# Patient Record
Sex: Male | Born: 1980
Health system: Southern US, Community
[De-identification: ages and names within clinical notes are randomized; demographics above are authoritative.]

## PROBLEM LIST (undated history)

## (undated) DIAGNOSIS — I743 Embolism and thrombosis of arteries of the lower extremities: Secondary | ICD-10-CM

## (undated) DIAGNOSIS — E559 Vitamin D deficiency, unspecified: Secondary | ICD-10-CM

## (undated) DIAGNOSIS — F32A Depression, unspecified: Secondary | ICD-10-CM

## (undated) DIAGNOSIS — N469 Male infertility, unspecified: Secondary | ICD-10-CM

## (undated) DIAGNOSIS — K5900 Constipation, unspecified: Secondary | ICD-10-CM

## (undated) DIAGNOSIS — R0602 Shortness of breath: Secondary | ICD-10-CM

## (undated) DIAGNOSIS — R002 Palpitations: Secondary | ICD-10-CM

## (undated) DIAGNOSIS — F419 Anxiety disorder, unspecified: Secondary | ICD-10-CM

## (undated) DIAGNOSIS — F329 Major depressive disorder, single episode, unspecified: Secondary | ICD-10-CM

## (undated) HISTORY — DX: Male infertility, unspecified: N46.9

## (undated) HISTORY — DX: Constipation, unspecified: K59.00

## (undated) HISTORY — DX: Vitamin D deficiency, unspecified: E55.9

## (undated) HISTORY — DX: Shortness of breath: R06.02

## (undated) HISTORY — DX: Embolism and thrombosis of arteries of the lower extremities: I74.3

## (undated) HISTORY — PX: WISDOM TOOTH EXTRACTION: SHX21

## (undated) HISTORY — DX: Anxiety disorder, unspecified: F41.9

## (undated) HISTORY — DX: Palpitations: R00.2

---

## 2007-10-19 ENCOUNTER — Emergency Department (HOSPITAL_COMMUNITY): Admission: EM | Admit: 2007-10-19 | Discharge: 2007-10-19 | Payer: Self-pay | Admitting: Emergency Medicine

## 2011-07-31 LAB — URINE MICROSCOPIC-ADD ON

## 2011-07-31 LAB — URINE CULTURE: Colony Count: >=100000

## 2011-07-31 LAB — URINALYSIS, ROUTINE W REFLEX MICROSCOPIC
Bilirubin Urine: NEGATIVE
Protein, ur: >300 — AB
Urobilinogen, UA: 1

## 2011-08-29 ENCOUNTER — Emergency Department (HOSPITAL_COMMUNITY)
Admission: EM | Admit: 2011-08-29 | Discharge: 2011-08-29 | Disposition: A | Discharge status: Home / Self Care | Payer: 59 | Attending: Emergency Medicine | Admitting: Emergency Medicine

## 2011-08-29 DIAGNOSIS — Z79899 Other long term (current) drug therapy: Secondary | ICD-10-CM | POA: Insufficient documentation

## 2011-08-29 DIAGNOSIS — F3289 Other specified depressive episodes: Secondary | ICD-10-CM | POA: Insufficient documentation

## 2011-08-29 DIAGNOSIS — R197 Diarrhea, unspecified: Secondary | ICD-10-CM | POA: Insufficient documentation

## 2011-08-29 DIAGNOSIS — F329 Major depressive disorder, single episode, unspecified: Secondary | ICD-10-CM | POA: Insufficient documentation

## 2011-08-29 DIAGNOSIS — R112 Nausea with vomiting, unspecified: Secondary | ICD-10-CM | POA: Insufficient documentation

## 2011-08-29 LAB — COMPREHENSIVE METABOLIC PANEL
BUN: 20 mg/dL (ref 6–23)
Calcium: 9.9 mg/dL (ref 8.4–10.5)
GFR calc Af Amer: >90 mL/min (ref >90)
Glucose, Bld: 121 mg/dL — ABNORMAL HIGH (ref 70–99)
Sodium: 137 mEq/L (ref 135–145)
Total Protein: 7.6 g/dL (ref 6.0–8.3)

## 2011-08-29 LAB — CBC
Hemoglobin: 16.7 g/dL (ref 13.0–17.0)
MCH: 30.4 pg (ref 26.0–34.0)
MCHC: 35.2 g/dL (ref 30.0–36.0)
RDW: 12.7 % (ref 11.5–15.5)

## 2011-08-29 LAB — DIFFERENTIAL
Basophils Absolute: 0 10*3/uL (ref 0.0–0.1)
Lymphs Abs: 0.4 10*3/uL — ABNORMAL LOW (ref 0.7–4.0)
Monocytes Absolute: 1.1 10*3/uL — ABNORMAL HIGH (ref 0.1–1.0)
Monocytes Relative: 10 % (ref 3–12)
Neutro Abs: 9.4 10*3/uL — ABNORMAL HIGH (ref 1.7–7.7)

## 2011-08-29 LAB — LIPASE, BLOOD: Lipase: 19 U/L (ref 11–59)

## 2011-12-27 ENCOUNTER — Encounter (HOSPITAL_COMMUNITY): Payer: Self-pay | Admitting: *Deleted

## 2011-12-27 ENCOUNTER — Emergency Department (HOSPITAL_COMMUNITY)
Admission: EM | Admit: 2011-12-27 | Discharge: 2011-12-27 | Disposition: A | Discharge status: Home / Self Care | Payer: 59 | Source: Home / Self Care | Attending: Family Medicine | Admitting: Family Medicine

## 2011-12-27 DIAGNOSIS — J069 Acute upper respiratory infection, unspecified: Secondary | ICD-10-CM

## 2011-12-27 HISTORY — DX: Depression, unspecified: F32.A

## 2011-12-27 HISTORY — DX: Major depressive disorder, single episode, unspecified: F32.9

## 2011-12-27 NOTE — Discharge Instructions (Signed)
Drink plenty of fluids as discussed, use lozenges and mucinex or delsym for cough. Return or see your doctor if further problems °

## 2011-12-27 NOTE — ED Provider Notes (Signed)
History     CSN: [id number]  Arrival date & time 12/27/11  1031   First MD Initiated Contact with Patient 12/27/11 1047      Chief Complaint  Patient presents with  . Sore Throat    (Consider location/radiation/quality/duration/timing/severity/associated sxs/prior treatment) Patient is a 31 y.o. male presenting with pharyngitis. The history is provided by the patient.  Sore Throat This is a new problem. The current episode started more than 1 week ago. The problem occurs constantly. The problem has not changed since onset.Associated symptoms comments: Seen twice by lmd prior to onset for flu sx, given z-pak. The symptoms are aggravated by swallowing.    Past Medical History  Diagnosis Date  . Asthma     exercise-induced  . Depression     History reviewed. No pertinent past surgical history.  No family history on file.  History  Substance Use Topics  . Smoking status: Never Smoker   . Smokeless tobacco: Not on file  . Alcohol Use: Yes     occasional      Review of Systems  Constitutional: Negative.   HENT: Positive for congestion, sore throat and rhinorrhea.   Respiratory: Negative.   Gastrointestinal: Negative.   Skin: Negative.     Allergies  Review of patient's allergies indicates no known allergies.  Home Medications   Current Outpatient Rx  Name Route Sig Dispense Refill  . ALBUTEROL IN Inhalation Inhale into the lungs as needed.    . BUPROPION HCL PO Oral Take by mouth.      BP 129/68  Pulse 86  Temp(Src) 97.9 F (36.6 C) (Oral)  Resp 18  SpO2 98%  Physical Exam  Nursing note and vitals reviewed. Constitutional: He is oriented to person, place, and time. He appears well-developed and well-nourished.  HENT:  Head: Normocephalic.  Right Ear: External ear normal.  Left Ear: External ear normal.  Nose: Nose normal.  Mouth/Throat: Oropharynx is clear and moist.  Eyes: Conjunctivae are normal. Pupils are equal, round, and reactive to light.    Neck: Normal range of motion. Neck supple.  Cardiovascular: Normal rate, regular rhythm, normal heart sounds and intact distal pulses.   Pulmonary/Chest: Effort normal and breath sounds normal.  Lymphadenopathy:    He has no cervical adenopathy.  Neurological: He is alert and oriented to person, place, and time.  Skin: Skin is warm and dry.    ED Course  Procedures (including critical care time)  Labs Reviewed - No data to display No results found.   1. URI (upper respiratory infection)       MDM          Barkley Bruns, MD 12/27/11 1133

## 2011-12-27 NOTE — ED Notes (Signed)
Reports had influenza, followed by bronchitis and sinusitis (had Z-pak); as he was finishing Z-pak approx 10 days ago woke with sore throat.  Sore throat has continued w/ fluctuating severity.  Denies fevers; has residual cough following influenza, but nothing new.  Has been drinking plenty of fluids and taking vitamins.

## 2015-10-27 HISTORY — PX: COLONOSCOPY: SHX174

## 2015-11-04 DIAGNOSIS — Z3189 Encounter for other procreative management: Secondary | ICD-10-CM | POA: Diagnosis not present

## 2015-12-10 DIAGNOSIS — R5383 Other fatigue: Secondary | ICD-10-CM | POA: Diagnosis not present

## 2015-12-10 DIAGNOSIS — G4733 Obstructive sleep apnea (adult) (pediatric): Secondary | ICD-10-CM | POA: Diagnosis not present

## 2015-12-10 DIAGNOSIS — N4601 Organic azoospermia: Secondary | ICD-10-CM | POA: Diagnosis not present

## 2015-12-10 DIAGNOSIS — F321 Major depressive disorder, single episode, moderate: Secondary | ICD-10-CM | POA: Diagnosis not present

## 2015-12-18 ENCOUNTER — Institutional Professional Consult (permissible substitution): Payer: 59 | Admitting: Neurology

## 2016-01-14 ENCOUNTER — Encounter: Payer: Self-pay | Admitting: Neurology

## 2016-01-14 ENCOUNTER — Ambulatory Visit (INDEPENDENT_AMBULATORY_CARE_PROVIDER_SITE_OTHER): Payer: 59 | Admitting: Neurology

## 2016-01-14 VITALS — BP 150/82 | HR 64 | Resp 20 | Ht 70.0 in | Wt 320.0 lb

## 2016-01-14 DIAGNOSIS — G4710 Hypersomnia, unspecified: Secondary | ICD-10-CM | POA: Diagnosis not present

## 2016-01-14 DIAGNOSIS — G4730 Sleep apnea, unspecified: Secondary | ICD-10-CM | POA: Diagnosis not present

## 2016-01-14 DIAGNOSIS — R0683 Snoring: Secondary | ICD-10-CM | POA: Insufficient documentation

## 2016-01-14 NOTE — Patient Instructions (Signed)

## 2016-01-14 NOTE — Progress Notes (Signed)
S[facility]EP MED[facility]INE CLINIC   Provider:  Larey Seat, M D  Referring Provider: Merrilee Seashore, MD Primary Care Physician:  Merrilee Seashore, MD  Chief Complaint  Patient presents with  . New Patient (Initial Visit)    referral by Dr. Rogelia Mire, wife tells pt that he snores, rm 11, alone    HPI:  Alex [name] is a 35 y.o. male , seen here as a referral from Dr. Ashby Dawes for a sleep evaluation,   Chief complaint according to patient : " I have been snoring and was witnessed to have crescendo breathing, but HST was inconclusive "  Alex [name] is here today upon request of his primary care physician, he has been fatigued for quite a while. Alex [name] also has noticed that he sometimes when trying to take a nap feels actually worse not refreshed, not restored but craving more. Plus this may actually detract from his nocturnal sleep. He has always been somewhat sleep drunken from nap time even as a child. Dr. Ashby Dawes suspected that the patient may have obstructive sleep apnea based on his BMI, his Mallampati, and his symptoms including erectile dysfunction ( from SSRI )  , later azoospermia and still having residual depressive disorder. The patient work is as an Haematologist, is a Medical illustrator . Sleep habits are as follows:  The patient usually goes to bed around 9 PM but may not fall asleep promptly. He likes to read in bed. His bedroom otherwise is cool, quiet and dark. His alarm is set for 5:40 AM but he hits the snooze button a couple of times before getting up. He has recently noticed that his nights a more fragmented he turns over wakes up a couple of times but he does not look at a clock. He does not feel that his sleep has worsened. When he gets up in the morning he has a slightly dry mouth he does not wake up with headaches or from headaches. He sleeps on one pillow. He prefers to sleep on his side. He does not have back pain or other discomfort no shortness of breath no  coughing and no restless legs.  Sleep medical history and family sleep history: father has OSA but never got diagnosed.   Social history:  Married, no children. No tobacco use ever, he grew up  with smoking parents.  ETOH; 1-2 drinks a week, caffeine ; 3-4 cups a day, tea and coffee. No Soda.  Review of Systems: Out of a complete 14 system review, the patient complains of only the following symptoms, and all other reviewed systems are negative. Fatigue more than sleepiness  the patient endorsed snoring, fatigue, blood in stool, respiratory allergies depression and anxiety decreased energy and loss of interest in some activities. He has been treated for asthma, depression and anxiety in the past he is taking albuterol and is currently taking Viibryd 20 minute times daily his father has high blood pressure paternal grandmother and aunt had leukemia a maternal grandfather suffered from a skin cancer.  Epworth score  5, Fatigue severity score is elevated at 40  , depression score PHQ9 see below.    Social History   Social History  . Marital Status: Married    Spouse Name: N/A  . Number of Children: N/A  . Years of Education: N/A   Occupational History  . Not on file.   Social History Main Topics  . Smoking status: Never Smoker   . Smokeless tobacco: Not on file  . Alcohol Use:  1.2 oz/week    2 Standard drinks or equivalent per week     Comment: occasional  . Drug Use: Yes  . Sexual Activity: Not on file   Other Topics Concern  . Not on file   Social History Narrative   Drinks 3 cups of caffeine daily.    Family History  Problem Relation Age of Onset  . Hypertension Father     Past Medical History  Diagnosis Date  . Asthma     exercise-induced  . Depression   . Anxiety     History reviewed. No pertinent past surgical history.  Current Outpatient Prescriptions  Medication Sig Dispense Refill  . ALBUTEROL IN Inhale into the lungs as needed.    . ALPRAZolam (XANAX)  0.25 MG tablet Take 0.25 mg by mouth daily as needed for anxiety.    . cetirizine (ZYRTEC) 10 MG tablet Take 10 mg by mouth daily.    . Cholecalciferol (VITAMIN D3) 50000 units CAPS Take 5,000 Units by mouth.    . Vilazodone HCl (VIIBRYD) 40 MG TABS Take 40 mg by mouth daily.     No current facility-administered medications for this visit.    Allergies as of 01/14/2016  . (No Known Allergies)    Vitals: BP 150/82 mmHg  Pulse 64  Resp 20  Ht 5\' 10"  (1.778 m)  Wt 320 lb (145.151 kg)  BMI 45.92 kg/m2 Last Weight:  Wt Readings from Last 1 Encounters:  01/14/16 320 lb (145.151 kg)   [id number] mass index is 45.92 kg/(m^2).     Last Height:   Ht Readings from Last 1 Encounters:  01/14/16 5\' 10"  (1.778 m)    Physical exam:  General: The patient is awake, alert and appears not in acute distress. The patient is well groomed. Head: Normocephalic, atraumatic. Neck is supple. Mallampati 4,  neck circumference: 18. Nasal airflow non restricted, no nasal septal deviation ,Retrognathia is not seen. Full facial hair.   Cardiovascular:  Regular rate and rhythm , without  murmurs or carotid bruit, and without distended neck veins. Respiratory: Lungs are clear to auscultation. Skin:  Without evidence of edema, or rash Trunk: BMI is morbidly obese  Neurologic exam : The patient is awake and alert, oriented to place and time.   Memory subjective  described as intact.  Attention span & concentration ability appears normal.  Speech is fluent,  without dysarthria, dysphonia or aphasia.  Mood and affect are appropriate.  Cranial nerves: Pupils are equal and briskly reactive to light. Funduscopic exam without evidence of pallor or edema.  Extraocular movements  in vertical and horizontal planes intact and without nystagmus. Visual fields by finger perimetry are intact. Hearing to finger rub intact.   Facial sensation intact to fine touch.  Facial motor strength is symmetric and tongue and uvula  move midline. Shoulder shrug was symmetrical.   Motor exam:Normal tone, muscle bulk and symmetric strength in all extremities.  Sensory:  Fine touch, pinprick and vibration were tested in all extremities. Proprioception tested in the upper extremities was normal.  Coordination: Rapid alternating movements  and  Finger-to-nose maneuver were normal without evidence of ataxia, dysmetria or tremor.  Gait and station: Patient walks without assistive device and is able unassisted to climb up to the exam table. Strength within normal limits.  Stance is stable and normal.   Deep tendon reflexes: in the  upper and lower extremities are symmetric and intact. Babinski maneuver response is  downgoing.  The patient was advised of  the nature of the diagnosed sleep disorder , the treatment options and risks for general a health and wellness arising from not treating the condition.  I spent more than 40 minutes of face to face time with the patient. Greater than 50% of time was spent in counseling and coordination of care. We have discussed the diagnosis and differential and I answered the patient's questions.     Assessment:  After physical and neurologic examination, review of laboratory studies,  Personal review of imaging studies, reports of other /same  Imaging studies ,  Results of polysomnography/ neurophysiology testing and pre-existing records as far as provided in visit., my assessment is  Alex [name] has indeed risk factors for obstructive sleep apnea including his body mass index,, the high-grade Mallampati and a neck circumference of 17/2 inches. That he has been known to snore at least on occasion is also important. I find it plausible that his daytime fatigue and sleepiness can be related to the depression as well as to a primary sleep disorder. He had undergone a home sleep test with Dr. Mathis Fare office about 12 months ago under the guidance of Dr. Minna Antis. I would not be surprised if there is an  artifactual component to its outcome which was that the patient did not have sleep apnea. For this reason I want him to repeat a sleep study I also will give additional information about weight loss programs especially carbohydrate adjusted diet such as weight watcher, to some degree the Atkins or [employer] or PALEO .   Plan:  Treatment plan and additional workup :  SPLIT study with hypercapnia and hypoxia measures. Please use the opportunity to titrate oxygen should the patient have hypoxia without apnea.  RV after Test.  Asencion Partridge Matthew Pais MD  01/14/2016   CC: Merrilee Seashore, [place], [place] [id number]

## 2016-01-16 ENCOUNTER — Ambulatory Visit (INDEPENDENT_AMBULATORY_CARE_PROVIDER_SITE_OTHER): Payer: 59 | Admitting: Neurology

## 2016-01-16 DIAGNOSIS — G4710 Hypersomnia, unspecified: Secondary | ICD-10-CM

## 2016-01-16 DIAGNOSIS — G4730 Sleep apnea, unspecified: Secondary | ICD-10-CM

## 2016-01-16 DIAGNOSIS — R0683 Snoring: Secondary | ICD-10-CM

## 2016-01-16 NOTE — Sleep Study (Signed)
Please see the scanned sleep study interpretation located in the procedure tab in the chart view section.  

## 2016-01-20 DIAGNOSIS — N4611 Organic oligospermia: Secondary | ICD-10-CM | POA: Diagnosis not present

## 2016-01-20 DIAGNOSIS — N500 Atrophy of testis: Secondary | ICD-10-CM | POA: Diagnosis not present

## 2016-01-21 ENCOUNTER — Encounter: Payer: Self-pay | Admitting: *Deleted

## 2016-01-23 ENCOUNTER — Telehealth: Payer: Self-pay

## 2016-01-23 MED FILL — VIIBRYD 40 MG TABLET: 40 | 30 days supply | Qty: 30 | Fill #0

## 2016-01-23 NOTE — Telephone Encounter (Signed)
I called pt to discuss sleep study results, no answer, VM box full, unable to leave a message. Will try again later.

## 2016-01-30 NOTE — Telephone Encounter (Signed)
Spoke to pt and advised him that his sleep study did not reveal any evidence for significant central or obstructive sleep apnea. There were no significant periodic limb movements of sleep noted and his sleep architecture was essentially normal. Pt's fatigue remains unexplained. I advised pt to lose weight, diet, and exercise if not contraindicated by his other physicians. Pt verbalized understanding. Pt asked for a follow up appt to discuss treatment for fatigue. An appt was made for 5/10 at 3:30. Pt verbalized understanding.

## 2016-03-04 ENCOUNTER — Ambulatory Visit (INDEPENDENT_AMBULATORY_CARE_PROVIDER_SITE_OTHER): Payer: 59 | Admitting: Neurology

## 2016-03-04 ENCOUNTER — Encounter: Payer: Self-pay | Admitting: Neurology

## 2016-03-04 DIAGNOSIS — I10 Essential (primary) hypertension: Secondary | ICD-10-CM

## 2016-03-04 DIAGNOSIS — R030 Elevated blood-pressure reading, without diagnosis of hypertension: Secondary | ICD-10-CM | POA: Insufficient documentation

## 2016-03-04 NOTE — Progress Notes (Signed)
SLEEP MEDICINE CLINIC   Provider:  Larey Seat, M D  Referring Provider: Merrilee Seashore, MD Primary Care Physician:  Merrilee Seashore, MD  Chief Complaint  Patient presents with  . Follow-up    discuss sleep study results, rm 10, alone    HPI:  Alex [name] is a 35 y.o. male , seen here as a referral from Dr. Ashby Dawes for a sleep evaluation,   Chief complaint according to patient : " I have been snoring and was witnessed to have crescendo breathing, but HST was inconclusive "  Alex [name] is here today upon request of his primary care physician, he has been fatigued for quite a while. Alex [name] also has noticed that he sometimes when trying to take a nap feels actually worse not refreshed, not restored but craving more. Plus this may actually detract from his nocturnal sleep. He has always been somewhat sleep drunken from nap time even as a child. Dr. Ashby Dawes suspected that the patient may have obstructive sleep apnea based on his BMI, his Mallampati, and his symptoms including erectile dysfunction ( from SSRI )  , later azoospermia and still having residual depressive disorder. The patient work is as an Haematologist, is a Medical illustrator . Sleep habits are as follows:  The patient usually goes to bed around 9 PM but may not fall asleep promptly. He likes to read in bed. His bedroom otherwise is cool, quiet and dark. His alarm is set for 5:40 AM but he hits the snooze button a couple of times before getting up. He has recently noticed that his nights a more fragmented he turns over wakes up a couple of times but he does not look at a clock. He does not feel that his sleep has worsened. When he gets up in the morning he has a slightly dry mouth he does not wake up with headaches or from headaches. He sleeps on one pillow. He prefers to sleep on his side. He does not have back pain or other discomfort no shortness of breath no coughing and no restless legs.  Sleep medical history  and family sleep history: father has OSA but never got diagnosed.  Social history:  Married, no children. No tobacco use ever, he grew up  with smoking parents.  ETOH; 1-2 drinks a week, caffeine ; 3-4 cups a day, tea and coffee. No Soda.  Interval history from 03/04/2016.  [name] is here today to follow-up on his recent sleep study performed on March 23. His deep study showed a rare results of 0.0 apneas for the whole night. He spent about 35% of the total sleep time in supine position and still did not produce any apneas he had very rare periodic limb movements little bit of clicking his average heart rate was 67 bpm in sinus rhythm. It a normal sleep EEG. I would like to mentioned that he did not snore. The sleep architecture was excellent, sleep was neither fragmented nor lacking specific sleep phases. The patient entered slow wave sleep as well as REM sleep for a total of 4 REM sleep cycles beginning before midnight and becoming longer and shorter intervals into the morning hours.   Review of Systems: Out of a complete 14 system review, the patient complains of only the following symptoms, and all other reviewed systems are negative. Fatigue more than sleepiness  the patient endorsed snoring, fatigue, blood in stool, respiratory allergies depression and anxiety decreased energy and loss of interest in some activities. He  has been treated for asthma, depression and anxiety in the past he is taking albuterol and is currently taking Viibryd 20 minute times daily his father has high blood pressure paternal grandmother and aunt had leukemia a maternal grandfather suffered from a skin cancer.  Epworth score  8, Fatigue severity score is elevated at 40  , depression score PHQ9 see below.    Social History   Social History  . Marital Status: Married    Spouse Name: N/A  . Number of Children: N/A  . Years of Education: N/A   Occupational History  . Not on file.   Social History Main Topics   . Smoking status: Never Smoker   . Smokeless tobacco: Not on file  . Alcohol Use: 1.2 oz/week    2 Standard drinks or equivalent per week     Comment: occasional  . Drug Use: Yes  . Sexual Activity: Not on file   Other Topics Concern  . Not on file   Social History Narrative   Drinks 3 cups of caffeine daily.    Family History  Problem Relation Age of Onset  . Hypertension Father     Past Medical History  Diagnosis Date  . Asthma     exercise-induced  . Depression   . Anxiety     No past surgical history on file.  Current Outpatient Prescriptions  Medication Sig Dispense Refill  . ALBUTEROL IN Inhale into the lungs as needed.    . ALPRAZolam (XANAX) 0.25 MG tablet Take 0.25 mg by mouth daily as needed for anxiety.    . cetirizine (ZYRTEC) 10 MG tablet Take 10 mg by mouth daily.    . Cholecalciferol (VITAMIN D3) 50000 units CAPS Take 5,000 Units by mouth.    . Vilazodone HCl (VIIBRYD) 40 MG TABS Take 40 mg by mouth daily.     No current facility-administered medications for this visit.    Allergies as of 03/04/2016  . (No Known Allergies)    Vitals: BP 130/70 mmHg  Pulse 86  Resp 20  Ht 5\' 10"  (1.778 m)  Wt 324 lb (146.965 kg)  BMI 46.49 kg/m2 Last Weight:  Wt Readings from Last 1 Encounters:  03/04/16 324 lb (146.965 kg)   [id number] mass index is 46.49 kg/(m^2).     Last Height:   Ht Readings from Last 1 Encounters:  03/04/16 5\' 10"  (1.778 m)    Physical exam:  General: The patient is awake, alert and appears not in acute distress. The patient is well groomed. Head: Normocephalic, atraumatic. Neck is supple. Mallampati 4,  neck circumference: 18. Nasal airflow non restricted, no nasal septal deviation ,Retrognathia is not seen. Full facial hair.   Cardiovascular:  Regular rate and rhythm , without  murmurs or carotid bruit, and without distended neck veins. Respiratory: Lungs are clear to auscultation. Skin:  Without evidence of edema, or  rash Trunk: BMI is morbidly obese  Neurologic exam : The patient is awake and alert, oriented to place and time.    Cranial nerves: Pupils are equal and briskly reactive to light. Funduscopic exam without evidence of pallor or edema.  Extraocular movements  in vertical and horizontal planes intact and without nystagmus. Visual fields by finger perimetry are intact. Hearing to finger rub intact. Facial sensation intact to fine touch. Facial motor strength is symmetric and tongue and uvula move midline. Shoulder shrug was symmetrical.    The patient was advised of the nature of the diagnosed sleep disorder , the  treatment options and risks for general a health and wellness arising from not treating the condition.  I spent more than 15 minutes of face to face time with the patient. Greater than 50% of time was spent in counseling and coordination of care. We have discussed the diagnosis and differential and I answered the patient's questions.     Assessment:  After physical and neurologic examination, review of laboratory studies,  Personal review of imaging studies, reports of other /same  Imaging studies ,  Results of polysomnography/ neurophysiology testing and pre-existing records as far as provided in visit., my assessment is  Alex [name] has no apnea, and no other organic sleep disorder.  No RV needed!   Asencion Partridge Teisha Trowbridge MD  03/04/2016   CC: Merrilee Seashore, [address] [place], [place] [address]

## 2016-03-09 ENCOUNTER — Encounter: Payer: Self-pay | Admitting: *Deleted

## 2016-03-31 ENCOUNTER — Other Ambulatory Visit (HOSPITAL_COMMUNITY): Payer: Self-pay | Admitting: Internal Medicine

## 2016-03-31 DIAGNOSIS — K922 Gastrointestinal hemorrhage, unspecified: Secondary | ICD-10-CM | POA: Diagnosis not present

## 2016-03-31 DIAGNOSIS — H10212 Acute toxic conjunctivitis, left eye: Secondary | ICD-10-CM | POA: Diagnosis not present

## 2016-03-31 MED FILL — GENTAMICIN 3 MG/ML EYE DRP: 0.3 | 10 days supply | Qty: 5 | Fill #0

## 2016-04-02 ENCOUNTER — Encounter (HOSPITAL_COMMUNITY): Payer: Self-pay

## 2016-04-02 ENCOUNTER — Ambulatory Visit (HOSPITAL_COMMUNITY)
Admission: RE | Admit: 2016-04-02 | Discharge: 2016-04-02 | Disposition: A | Discharge status: Home / Self Care | Payer: 59 | Source: Ambulatory Visit | Attending: Internal Medicine | Admitting: Internal Medicine

## 2016-04-02 DIAGNOSIS — R10819 Abdominal tenderness, unspecified site: Secondary | ICD-10-CM | POA: Diagnosis not present

## 2016-04-02 DIAGNOSIS — R109 Unspecified abdominal pain: Secondary | ICD-10-CM | POA: Diagnosis not present

## 2016-04-02 DIAGNOSIS — K921 Melena: Secondary | ICD-10-CM | POA: Diagnosis not present

## 2016-04-02 DIAGNOSIS — K922 Gastrointestinal hemorrhage, unspecified: Secondary | ICD-10-CM

## 2016-04-02 MED ORDER — IOPAMIDOL (ISOVUE-300) INJECTION 61%
100.0000 mL | Freq: Once | INTRAVENOUS | Status: AC | PRN
  Administered 2016-04-02: 100 mL via INTRAVENOUS

## 2016-04-07 DIAGNOSIS — K922 Gastrointestinal hemorrhage, unspecified: Secondary | ICD-10-CM | POA: Diagnosis not present

## 2016-04-08 DIAGNOSIS — R197 Diarrhea, unspecified: Secondary | ICD-10-CM | POA: Diagnosis not present

## 2016-04-08 DIAGNOSIS — K625 Hemorrhage of anus and rectum: Secondary | ICD-10-CM | POA: Diagnosis not present

## 2016-04-08 MED FILL — MOVIPREP POWDER KIT: 100 | 1 days supply | Qty: 1 | Fill #0

## 2016-04-08 MED FILL — VIIBRYD 40 MG TABLET: 40 | 30 days supply | Qty: 30 | Fill #1

## 2016-04-21 DIAGNOSIS — K625 Hemorrhage of anus and rectum: Secondary | ICD-10-CM | POA: Diagnosis not present

## 2016-04-21 DIAGNOSIS — K921 Melena: Secondary | ICD-10-CM | POA: Diagnosis not present

## 2016-04-21 LAB — HM COLONOSCOPY

## 2016-07-07 DIAGNOSIS — Z0000 Encounter for general adult medical examination without abnormal findings: Secondary | ICD-10-CM | POA: Diagnosis not present

## 2016-07-14 DIAGNOSIS — F321 Major depressive disorder, single episode, moderate: Secondary | ICD-10-CM | POA: Diagnosis not present

## 2016-07-14 DIAGNOSIS — Z0000 Encounter for general adult medical examination without abnormal findings: Secondary | ICD-10-CM | POA: Diagnosis not present

## 2016-10-29 DIAGNOSIS — Z119 Encounter for screening for infectious and parasitic diseases, unspecified: Secondary | ICD-10-CM | POA: Diagnosis not present

## 2017-01-05 DIAGNOSIS — Z3189 Encounter for other procreative management: Secondary | ICD-10-CM | POA: Diagnosis not present

## 2017-07-20 DIAGNOSIS — Z136 Encounter for screening for cardiovascular disorders: Secondary | ICD-10-CM | POA: Diagnosis not present

## 2017-07-20 DIAGNOSIS — Z0000 Encounter for general adult medical examination without abnormal findings: Secondary | ICD-10-CM | POA: Diagnosis not present

## 2017-07-27 DIAGNOSIS — Z23 Encounter for immunization: Secondary | ICD-10-CM | POA: Diagnosis not present

## 2017-07-27 DIAGNOSIS — R21 Rash and other nonspecific skin eruption: Secondary | ICD-10-CM | POA: Diagnosis not present

## 2017-07-27 DIAGNOSIS — Z0000 Encounter for general adult medical examination without abnormal findings: Secondary | ICD-10-CM | POA: Diagnosis not present

## 2017-07-27 DIAGNOSIS — F321 Major depressive disorder, single episode, moderate: Secondary | ICD-10-CM | POA: Diagnosis not present

## 2017-11-22 MED FILL — BUPROPION HCL XL 300 MG TAB: 300 | 90 days supply | Qty: 90 | Fill #0

## 2017-11-22 MED FILL — buPROPion HCL ER (XL) 150 M: 150 | 4 days supply | Qty: 4 | Fill #0

## 2017-12-13 ENCOUNTER — Inpatient Hospital Stay (HOSPITAL_COMMUNITY)
Admission: AD | Admit: 2017-12-13 | Discharge: 2017-12-16 | DRG: 885 | Disposition: A | Discharge status: Home / Self Care | Payer: 59 | Source: Intra-hospital | Attending: Psychiatry | Admitting: Psychiatry

## 2017-12-13 ENCOUNTER — Emergency Department (HOSPITAL_COMMUNITY)
Admission: EM | Admit: 2017-12-13 | Discharge: 2017-12-13 | Disposition: A | Discharge status: Other Acute Inpatient Hospital | Payer: 59 | Attending: Emergency Medicine | Admitting: Emergency Medicine

## 2017-12-13 ENCOUNTER — Encounter (HOSPITAL_COMMUNITY): Payer: Self-pay | Admitting: Emergency Medicine

## 2017-12-13 DIAGNOSIS — F410 Panic disorder [episodic paroxysmal anxiety] without agoraphobia: Secondary | ICD-10-CM | POA: Diagnosis present

## 2017-12-13 DIAGNOSIS — Z818 Family history of other mental and behavioral disorders: Secondary | ICD-10-CM | POA: Diagnosis not present

## 2017-12-13 DIAGNOSIS — G4700 Insomnia, unspecified: Secondary | ICD-10-CM | POA: Diagnosis present

## 2017-12-13 DIAGNOSIS — R45851 Suicidal ideations: Secondary | ICD-10-CM | POA: Insufficient documentation

## 2017-12-13 DIAGNOSIS — F411 Generalized anxiety disorder: Secondary | ICD-10-CM | POA: Diagnosis not present

## 2017-12-13 DIAGNOSIS — G4710 Hypersomnia, unspecified: Secondary | ICD-10-CM | POA: Diagnosis present

## 2017-12-13 DIAGNOSIS — J45909 Unspecified asthma, uncomplicated: Secondary | ICD-10-CM | POA: Diagnosis present

## 2017-12-13 DIAGNOSIS — Z6841 Body Mass Index (BMI) 40.0 and over, adult: Secondary | ICD-10-CM

## 2017-12-13 DIAGNOSIS — I10 Essential (primary) hypertension: Secondary | ICD-10-CM | POA: Diagnosis present

## 2017-12-13 DIAGNOSIS — G4730 Sleep apnea, unspecified: Secondary | ICD-10-CM | POA: Diagnosis present

## 2017-12-13 DIAGNOSIS — Z8249 Family history of ischemic heart disease and other diseases of the circulatory system: Secondary | ICD-10-CM | POA: Diagnosis not present

## 2017-12-13 DIAGNOSIS — F332 Major depressive disorder, recurrent severe without psychotic features: Principal | ICD-10-CM | POA: Diagnosis present

## 2017-12-13 DIAGNOSIS — Z79899 Other long term (current) drug therapy: Secondary | ICD-10-CM | POA: Insufficient documentation

## 2017-12-13 DIAGNOSIS — F329 Major depressive disorder, single episode, unspecified: Secondary | ICD-10-CM | POA: Diagnosis present

## 2017-12-13 DIAGNOSIS — F322 Major depressive disorder, single episode, severe without psychotic features: Secondary | ICD-10-CM

## 2017-12-13 DIAGNOSIS — F419 Anxiety disorder, unspecified: Secondary | ICD-10-CM | POA: Diagnosis not present

## 2017-12-13 LAB — COMPREHENSIVE METABOLIC PANEL
ALT: 80 U/L — ABNORMAL HIGH (ref 17–63)
AST: 125 U/L — AB (ref 15–41)
Albumin: 4.4 g/dL (ref 3.5–5.0)
Alkaline Phosphatase: 68 U/L (ref 38–126)
Anion gap: 13 (ref 5–15)
BILIRUBIN TOTAL: 0.8 mg/dL (ref 0.3–1.2)
BUN: 11 mg/dL (ref 6–20)
CO2: 22 mmol/L (ref 22–32)
CREATININE: 0.96 mg/dL (ref 0.61–1.24)
Calcium: 9.3 mg/dL (ref 8.9–10.3)
Chloride: 105 mmol/L (ref 101–111)
GFR calc Af Amer: >60 mL/min (ref >60)
GFR calc non Af Amer: >60 mL/min (ref >60)
Glucose, Bld: 104 mg/dL — ABNORMAL HIGH (ref 65–99)
POTASSIUM: 4 mmol/L (ref 3.5–5.1)
Sodium: 140 mmol/L (ref 135–145)
Total Protein: 7.5 g/dL (ref 6.5–8.1)

## 2017-12-13 LAB — CBC
HCT: 42.5 % (ref 39.0–52.0)
Hemoglobin: 14.5 g/dL (ref 13.0–17.0)
MCH: 29 pg (ref 26.0–34.0)
MCHC: 34.1 g/dL (ref 30.0–36.0)
MCV: 85 fL (ref 78.0–100.0)
PLATELETS: 241 10*3/uL (ref 150–400)
RBC: 5 MIL/uL (ref 4.22–5.81)
RDW: 13.6 % (ref 11.5–15.5)
WBC: 7.9 10*3/uL (ref 4.0–10.5)

## 2017-12-13 LAB — RAPID URINE DRUG SCREEN, HOSP PERFORMED
Amphetamines: NOT DETECTED
BARBITURATES: NOT DETECTED
Benzodiazepines: POSITIVE — AB
Cocaine: NOT DETECTED
Opiates: NOT DETECTED
Tetrahydrocannabinol: NOT DETECTED

## 2017-12-13 LAB — ACETAMINOPHEN LEVEL: Acetaminophen (Tylenol), Serum: <10 ug/mL — ABNORMAL LOW (ref 10–30)

## 2017-12-13 LAB — SALICYLATE LEVEL: Salicylate Lvl: <7 mg/dL (ref 2.8–30.0)

## 2017-12-13 LAB — ETHANOL

## 2017-12-13 MED ORDER — ACETAMINOPHEN 325 MG PO TABS: 650.0000 mg | ORAL_TABLET | Freq: Four times a day (QID) | ORAL | Status: DC | PRN

## 2017-12-13 MED ORDER — TRAZODONE HCL 50 MG PO TABS: 50.0000 mg | ORAL_TABLET | Freq: Every evening | ORAL | Status: DC | PRN

## 2017-12-13 MED ORDER — MAGNESIUM HYDROXIDE 400 MG/5ML PO SUSP: 30.0000 mL | Freq: Every day | ORAL | Status: DC | PRN

## 2017-12-13 MED ORDER — IBUPROFEN 200 MG PO TABS: 600.0000 mg | ORAL_TABLET | Freq: Three times a day (TID) | ORAL | Status: DC | PRN

## 2017-12-13 MED ORDER — LORAZEPAM 1 MG PO TABS
1.0000 mg | ORAL_TABLET | Freq: Once | ORAL | Status: AC
  Administered 2017-12-13: 1 mg via ORAL
  Filled 2017-12-13: qty 1

## 2017-12-13 MED ORDER — HYDROXYZINE HCL 25 MG PO TABS: 25.0000 mg | ORAL_TABLET | Freq: Three times a day (TID) | ORAL | Status: DC | PRN

## 2017-12-13 MED ORDER — ALUM & MAG HYDROXIDE-SIMETH 200-200-20 MG/5ML PO SUSP: 30.0000 mL | ORAL | Status: DC | PRN

## 2017-12-13 NOTE — ED Notes (Signed)
On admission to the [facility] pt has a flat affect and depressed mood. He is calm and cooperative. Pt states that he has been depressed and having some suicidal thoughts without a plan.

## 2017-12-13 NOTE — ED Notes (Addendum)
Patient's belongings labeled and two bags placed in cabinet for [facility] belongings.
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
## 2017-12-13 NOTE — ED Notes (Signed)
Patient wanded by security. 

## 2017-12-13 NOTE — ED Triage Notes (Signed)
Patient reports that since Saturday worsening depression with SI. States no definite plan, just has occasional thoughts of running car in to traffic or off cliff. 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

## 2017-12-13 NOTE — ED Notes (Signed)
Brook, RN at [facility] reported to transfer patient after 11 pm.

## 2017-12-13 NOTE — Discharge Instructions (Signed)
Please make sure that you follow up with your primary care doctor to have your liver enzymes checked to make sure they are not improving.

## 2017-12-13 NOTE — ED Notes (Signed)
TTS speaking with patient. 

## 2017-12-13 NOTE — ED Notes (Signed)
Pt transported to [facility] by [facility] transportation service for continuation of specialized care. Belongings given to driver after patient signed for them. Pt left in no acute distress. 

## 2017-12-13 NOTE — ED Notes (Signed)
Bed: [facility] Expected date:  Expected time:  Means of arrival:  Comments: 

## 2017-12-13 NOTE — ED Notes (Signed)
Bed: [facility] Expected date:  Expected time:  Means of arrival:  Comments: 

## 2017-12-13 NOTE — ED Provider Notes (Signed)
[facility] DEPT Provider Note   CSN: [id number] Arrival date & time: 12/13/17  0932     History   Chief Complaint Chief Complaint  Patient presents with  . Suicidal    HPI Alex [name] is a 37 y.o. male.  HPI 37 year old Caucasian male past medical history significant for anxiety, asthma, depression that presents to the emergency department today with complaints of suicidal ideations.  Patient states that for the past 2 days he has been having worsening depression and suicidal ideations.  Patient has no definite plan.  History of depression on Wellbutrin in the past however has been off Wellbutrin for the past year.  He does report that he was driving home yesterday and had thoughts of "what if I was to just run off of the road".  Patient also has a newborn baby at home and states that this has put strain on relationship and his anxiety level.  Patient denies any suicide attempt.  Denies any homicidal ideations.  Denies any auditory visual hallucinations.  Patient denies any tobacco use or drug use.  Does report occasional alcohol use.  Denies any medical complaints at this time.  Pt had negative hepatitis panel and hiv one year ago today.  Pt denies any fever, chill, ha, vision changes, lightheadedness, dizziness, congestion, neck pain, cp, sob, cough, abd pain, n/v/d, urinary symptoms, change in bowel habits, melena, hematochezia, lower extremity paresthesias.  Past Medical History:  Diagnosis Date  . Anxiety   . Asthma    exercise-induced  . Depression     Patient Active Problem List   Diagnosis Date Noted  . Morbid obesity due to excess calories ([place]) 03/04/2016  . Essential hypertension 03/04/2016  . Hypersomnia with sleep apnea 01/14/2016  . Snoring 01/14/2016    History reviewed. No pertinent surgical history.     Home Medications    Prior to Admission medications   Medication Sig Start Date End Date Taking? Authorizing  Provider  albuterol (PROVENTIL HFA;VENTOLIN HFA) 108 (90 Base) MCG/ACT inhaler Inhale 2 puffs into the lungs every 4 (four) hours as needed for wheezing or shortness of breath.   Yes [provider]  ALPRAZolam (XANAX) 0.25 MG tablet Take 0.25 mg by mouth daily as needed for anxiety.   Yes [provider]  buPROPion (WELLBUTRIN XL) 300 MG 24 hr tablet Take 300 mg by mouth daily. 11/22/17  Yes [provider]  cetirizine (ZYRTEC) 10 MG tablet Take 10 mg by mouth daily as needed for allergies.    Yes [provider]  Cholecalciferol (VITAMIN D-3) 5000 units TABS Take 5,000 Units by mouth daily.   Yes [provider]  naproxen sodium (ALEVE) 220 MG tablet Take 440 mg by mouth every 8 (eight) hours as needed (For joint pain.).   Yes [provider]    Family History Family History  Problem Relation Age of Onset  . Hypertension Father     Social History Social History   Tobacco Use  . Smoking status: Never Smoker  Substance Use Topics  . Alcohol use: Yes    Alcohol/week: 1.2 oz    Types: 2 Standard drinks or equivalent per week    Comment: occasional  . Drug use: Yes     Allergies   Patient has no known allergies.   Review of Systems Review of Systems  All other systems reviewed and are negative.    Physical Exam Updated Vital Signs BP 138/87 (BP Location: Left Arm)  Pulse 81   Temp 98.2 F (36.8 C) (Oral)   Resp 16   Ht 5\' 11"  (1.803 m)   Wt (!) 151 kg (333 lb)   SpO2 96%   BMI 46.44 kg/m   Physical Exam  Constitutional: He appears well-developed and well-nourished. No distress.  HENT:  Head: Normocephalic and atraumatic.  Eyes: Right eye exhibits no discharge. Left eye exhibits no discharge. No scleral icterus.  Neck: Normal range of motion.  Cardiovascular: Normal rate, regular rhythm, normal heart sounds and intact distal pulses. Exam reveals no gallop and no friction rub.  No murmur  heard. Pulmonary/Chest: Effort normal and breath sounds normal. No stridor. No respiratory distress. He has no wheezes. He has no rales. He exhibits no tenderness.  Abdominal: Soft. Bowel sounds are normal. He exhibits no distension. There is no tenderness. There is no rebound and no guarding.  Musculoskeletal: Normal range of motion.  Neurological: He is alert.  Skin: Skin is warm and dry. Capillary refill takes less than 2 seconds. No pallor.  No jaundice  Psychiatric: His behavior is normal. Judgment and thought content normal.  Nursing note and vitals reviewed.    ED Treatments / Results  Labs (all labs ordered are listed, but only abnormal results are displayed) Labs Reviewed  COMPREHENSIVE METABOLIC PANEL - Abnormal; Notable for the following components:      Result Value   Glucose, Bld 104 (*)    AST 125 (*)    ALT 80 (*)    All other components within normal limits  ACETAMINOPHEN LEVEL - Abnormal; Notable for the following components:   Acetaminophen (Tylenol), Serum <10 (*)    All other components within normal limits  RAPID URINE DRUG SCREEN, HOSP PERFORMED - Abnormal; Notable for the following components:   Benzodiazepines POSITIVE (*)    All other components within normal limits  ETHANOL  SALICYLATE LEVEL  CBC    EKG  EKG Interpretation None       Radiology No results found.  Procedures Procedures (including critical care time)  Medications Ordered in ED Medications - No data to display   Initial Impression / Assessment and Plan / ED Course  I have reviewed the triage vital signs and the nursing notes.  Pertinent labs & imaging results that were available during my care of the patient were reviewed by me and considered in my medical decision making (see chart for details).     Patient presents the ED for suicidal ideations.  Reports history of depression and anxiety.  Patient has no specific plan.  No prior attempt. Denies any homicidal ideations,  auditory or visual hallucinations.  Patient overall well-appearing and nontoxic.  Vital signs are reassuring.  Physical exam was reassuring.  Heart regular rate and rhythm.  Lungs clear to auscultation bilaterally.  No focal abdominal tenderness.  Lab work is reassuring.  No leukocytosis.  Patient does have mild elevation in his liver enzymes.  Normal kidney function.  Salicylate level normal.  Normal ethanol and acetaminophen level.  UDS is positive for benzodiazepines.  Mild elevation of liver enzymes.  Patient denies any chronic alcohol use or Tylenol use.  Denies any Tylenol overdose.  Acetaminophen level is less than 10.  Seems consistent with likely fatty liver.  This will need to be followed up with his primary care doctor to be rechecked in the next 1-2 weeks.  Given reassuring lab work and vital signs with physical exam patient can be cleared medically for TTS evaluation and disposition.  Final Clinical Impressions(s) / ED Diagnoses   Final diagnoses:  Suicidal ideation    ED Discharge Orders    None       Aaron Edelman 12/13/17 Nav[place], [place], DO 12/13/17 1916

## 2017-12-13 NOTE — ED Notes (Signed)
Patient reports SI no plan. Patient denies HI/AVH. Plan of care discussed. Encouragement and support provided and safety maintain. Q 15 min safety checks remain in place and video monitoring.

## 2017-12-13 NOTE — ED Notes (Signed)
Bed: [facility]14 Expected date:  Expected time:  Means of arrival:  Comments: [place] Crane B

## 2017-12-13 NOTE — [facility] Counselor (Signed)
Per Galen Daft at [facility], pt has been accepted to 405-1 to service of Dr Sanjuana Letters. Pt can be transported at 8:30 pm.   Arnold Long, LCSW Therapeutic Triage Specialist

## 2017-12-14 ENCOUNTER — Encounter (HOSPITAL_COMMUNITY): Payer: Self-pay | Admitting: *Deleted

## 2017-12-14 ENCOUNTER — Other Ambulatory Visit: Payer: Self-pay

## 2017-12-14 DIAGNOSIS — F322 Major depressive disorder, single episode, severe without psychotic features: Secondary | ICD-10-CM

## 2017-12-14 MED ORDER — BUSPIRONE HCL 15 MG PO TABS
7.5000 mg | ORAL_TABLET | Freq: Two times a day (BID) | ORAL | Status: DC
  Administered 2017-12-14 – 2017-12-16 (×5): 7.5 mg via ORAL
  Filled 2017-12-14 (×10): qty 1

## 2017-12-14 MED ORDER — LORAZEPAM 0.5 MG PO TABS: 0.5000 mg | ORAL_TABLET | Freq: Four times a day (QID) | ORAL | Status: DC | PRN

## 2017-12-14 MED ORDER — BUPROPION HCL ER (XL) 150 MG PO TB24
150.0000 mg | ORAL_TABLET | Freq: Every day | ORAL | Status: DC
  Administered 2017-12-14 – 2017-12-15 (×2): 150 mg via ORAL
  Filled 2017-12-14 (×4): qty 1

## 2017-12-14 NOTE — H&P (Addendum)
Psychiatric Admission Assessment Adult  Patient Identification: Alex [name] MRN:  [id number] Date of Evaluation:  12/14/2017 Chief Complaint: " I feel like I have been experiencing an existential crisis" Principal Diagnosis: MDD, no psychotic symptoms Diagnosis:   Patient Active Problem List   Diagnosis Date Noted  . MDD (major depressive disorder), single episode, severe , no psychosis ([place]) [F32.2] 12/13/2017  . Morbid obesity due to excess calories ([place]) [E66.01] 03/04/2016  . Essential hypertension [I10] 03/04/2016  . Hypersomnia with sleep apnea [G47.10, G47.30] 01/14/2016  . Snoring [R06.83] 01/14/2016   History of Present Illness: 37 year old married  male . He presented voluntarily  to the ED on 2/18 due to worsening depression, anxiety, panic attacks, and states that a few days prior to admission had suicidal ideations with thoughts of running car off a cliff, although denies having had any intention. States " I feel like I have been having an existential crisis", ruminating about how his life has changed and will change due to having a child. States " I started having thoughts about the meaning and the purpose of my life". Reports these thoughts often led to increased anxiety and panic .  Reports long history of worrying " a lot", and states he feels he has been worrying excessively recently.Endorses neuro-vegetative symptoms as below. Denies psychotic symptoms.  He reports he has been off psychiatric medications ( Wellbutrin, which he had been taking for several years ) for about a year .  Major stressors include having a new born child. Associated Signs/Symptoms: Depression Symptoms:  depressed mood, anhedonia, insomnia, suicidal thoughts without plan, anxiety, panic attacks, decreased appetite, (Hypo) Manic Symptoms:  Subjective feeling of racing thoughts . Anxiety Symptoms: reports recently worsening anxiety, some panic attacks. Psychotic Symptoms:  Denies  PTSD  Symptoms: Does not endorse, but reports frequent nightmares . Total Time spent with patient: 45 minutes  Past Psychiatric History: denies history of suicidal attempts , denies history of self cutting or self injurious behaviors, no history of psychosis . No clear history of mania or hypomania . Denies history of psychosis.  Endorses history of depression and of anxiety. Reports history of prior depressive episodes . No prior psychiatric admissions.   Is the patient at risk to self? Yes.    Has the patient been a risk to self in the past 6 months? No.  Has the patient been a risk to self within the distant past? No.  Is the patient a risk to others? No.  Has the patient been a risk to others in the past 6 months? No.  Has the patient been a risk to others within the distant past? No.   Prior Inpatient Therapy:  Denies any prior psychiatric admissions . Prior Outpatient Therapy:  Had been seeing a therapist via the EAP. Antidepressant prescribed by PCP  Alcohol Screening: 1. How often do you have a drink containing alcohol?: 2 to 4 times a month 2. How many drinks containing alcohol do you have on a typical day when you are drinking?: 3 or 4 3. How often do you have six or more drinks on one occasion?: Never AUDIT-C Score: 3 4. How often during the last year have you found that you were not able to stop drinking once you had started?: Never 5. How often during the last year have you failed to do what was normally expected from you becasue of drinking?: Never 6. How often during the last year have you needed a first drink in  the morning to get yourself going after a heavy drinking session?: Never 7. How often during the last year have you had a feeling of guilt of remorse after drinking?: Never 8. How often during the last year have you been unable to remember what happened the night before because you had been drinking?: Never 9. Have you or someone else been injured as a result of your  drinking?: No 10. Has a relative or friend or a doctor or another health worker been concerned about your drinking or suggested you cut down?: No Alcohol Use Disorder Identification Test Final Score (AUDIT): 3 Intervention/Follow-up: AUDIT Score <7 follow-up not indicated Substance Abuse History in the last 12 months:  Drinks occasionally , 2 per month. Denies drug abuse  Consequences of Substance Abuse: Denies  Previous Psychotropic Medications: reports he had been on Wellbutrin x several years, which he states has historically helped and been well tolerated,but had stopped several months ago. He states he also tried Lexapro, Viibryd, Effexor XR , but states he did not like these due to sexual side effects.  Psychological Evaluations: No  Past Medical History: denies medical illnesses  Past Medical History:  Diagnosis Date  . Anxiety   . Asthma    exercise-induced  . Depression    History reviewed. No pertinent surgical history. Family History:  Parents alive, live together, has one half sister  Family History  Problem Relation Age of Onset  . Hypertension Father    Family Psychiatric  History: states that there is a history of anxiety and depression in several members of the family. No alcohol abuse in family. No suicides in family Tobacco Screening: Have you used any form of tobacco in the last 30 days? (Cigarettes, Smokeless Tobacco, Cigars, and/or Pipes): No Social History: works as an Engineer, drilling, lives with wife and infant child, married x 73 years, has a 5 week old daughter .  Social History   Substance and Sexual Activity  Alcohol Use Yes  . Alcohol/week: 1.2 oz  . Types: 2 Standard drinks or equivalent per week   Comment: occasional     Social History   Substance and Sexual Activity  Drug Use No    Additional Social History:  Allergies:  No Known Allergies Lab Results:  Results for orders placed or performed during the hospital encounter of 12/13/17 (from the past 48  hour(s))  Rapid urine drug screen (hospital performed)     Status: Abnormal   Collection Time: 12/13/17 11:26 AM  Result Value Ref Range   Opiates NONE DETECTED NONE DETECTED   Cocaine NONE DETECTED NONE DETECTED   Benzodiazepines POSITIVE (A) NONE DETECTED   Amphetamines NONE DETECTED NONE DETECTED   Tetrahydrocannabinol NONE DETECTED NONE DETECTED   Barbiturates NONE DETECTED NONE DETECTED    Comment: (NOTE) DRUG SCREEN FOR MEDICAL PURPOSES ONLY.  IF CONFIRMATION IS NEEDED FOR ANY PURPOSE, NOTIFY LAB WITHIN 5 DAYS. LOWEST DETECTABLE LIMITS FOR URINE DRUG SCREEN Drug Class                     Cutoff (ng/mL) Amphetamine and metabolites    1000 Barbiturate and metabolites    200 Benzodiazepine                 540 Tricyclics and metabolites     300 Opiates and metabolites        300 Cocaine and metabolites        300 THC  50 Performed at [facility], [place] [address], [place], [place] [address]   Comprehensive metabolic panel     Status: Abnormal   Collection Time: 12/13/17 11:37 AM  Result Value Ref Range   Sodium 140 135 - 145 mmol/L   Potassium 4.0 3.5 - 5.1 mmol/L   Chloride 105 101 - 111 mmol/L   CO2 22 22 - 32 mmol/L   Glucose, Bld 104 (H) 65 - 99 mg/dL   BUN 11 6 - 20 mg/dL   Creatinine, Ser 0.96 0.61 - 1.24 mg/dL   Calcium 9.3 8.9 - 10.3 mg/dL   Total Protein 7.5 6.5 - 8.1 g/dL   Albumin 4.4 3.5 - 5.0 g/dL   AST 125 (H) 15 - 41 U/L   ALT 80 (H) 17 - 63 U/L   Alkaline Phosphatase 68 38 - 126 U/L   Total Bilirubin 0.8 0.3 - 1.2 mg/dL   GFR calc non Af Amer >60 >60 mL/min   GFR calc Af Amer >60 >60 mL/min    Comment: (NOTE) The eGFR has been calculated using the CKD EPI equation. This calculation has not been validated in all clinical situations. eGFR's persistently <60 mL/min signify possible Chronic Kidney Disease.    Anion gap 13 5 - 15    Comment: Performed at [facility], [place] [address], [place], [place] [address]  Ethanol     Status: None   Collection Time: 12/13/17 11:37 AM  Result Value Ref Range   Alcohol, Ethyl (B) <10 <10 mg/dL    Comment:        LOWEST DETECTABLE LIMIT FOR SERUM ALCOHOL IS 10 mg/dL FOR MEDICAL PURPOSES ONLY Performed at [place] [address], [place], [place] [address]   Salicylate level     Status: None   Collection Time: 12/13/17 11:37 AM  Result Value Ref Range   Salicylate Lvl <9.2 2.8 - 30.0 mg/dL    Comment: Performed at [facility], [place] [address], [place], [place] [address]  Acetaminophen level     Status: Abnormal   Collection Time: 12/13/17 11:37 AM  Result Value Ref Range   Acetaminophen (Tylenol), Serum <10 (L) 10 - 30 ug/mL    Comment:        THERAPEUTIC CONCENTRATIONS VARY SIGNIFICANTLY. A RANGE OF 10-30 ug/mL MAY BE AN EFFECTIVE CONCENTRATION FOR MANY PATIENTS. HOWEVER, SOME ARE BEST TREATED AT CONCENTRATIONS OUTSIDE THIS RANGE. ACETAMINOPHEN CONCENTRATIONS >150 ug/mL AT 4 HOURS AFTER INGESTION AND >50 ug/mL AT 12 HOURS AFTER INGESTION ARE OFTEN ASSOCIATED WITH TOXIC REACTIONS. Performed at [facility], [place] [address] [place], [place] [address]   cbc     Status: None   Collection Time: 12/13/17 11:37 AM  Result Value Ref Range   WBC 7.9 4.0 - 10.5 K/uL   RBC 5.00 4.22 - 5.81 MIL/uL   Hemoglobin 14.5 13.0 - 17.0 g/dL   HCT 42.5 39.0 - 52.0 %   MCV 85.0 78.0 - 100.0 fL   MCH 29.0 26.0 - 34.0 pg   MCHC 34.1 30.0 - 36.0 g/dL   RDW 13.6 11.5 - 15.5 %   Platelets 241 150 - 400 K/uL    Comment: Performed at [facility], [place] [address] [place], [place] [address]    Blood Alcohol level:  Lab Results  Component Value Date   ETH <10 62/56/3893    Metabolic Disorder Labs:  No results found for: HGBA1C, MPG No results found  for: PROLACTIN No results found for: CHOL, TRIG, HDL, CHOLHDL, VLDL, LDLCALC  Current  Medications: Current Facility-Administered Medications  Medication Dose Route Frequency Provider Last Rate Last Dose  . acetaminophen (TYLENOL) tablet 650 mg  650 mg Oral Q6H PRN Ethelene Hal, NP      . alum & mag hydroxide-simeth (MAALOX/MYLANTA) 200-200-20 MG/5ML suspension 30 mL  30 mL Oral Q4H PRN Ethelene Hal, NP      . hydrOXYzine (ATARAX/VISTARIL) tablet 25 mg  25 mg Oral TID PRN Ethelene Hal, NP      . magnesium hydroxide (MILK OF MAGNESIA) suspension 30 mL  30 mL Oral Daily PRN Ethelene Hal, NP      . traZODone (DESYREL) tablet 50 mg  50 mg Oral QHS PRN Ethelene Hal, NP       PTA Medications: Medications Prior to Admission  Medication Sig Dispense Refill Last Dose  . albuterol (PROVENTIL HFA;VENTOLIN HFA) 108 (90 Base) MCG/ACT inhaler Inhale 2 puffs into the lungs every 4 (four) hours as needed for wheezing or shortness of breath.   12/05/2017  . ALPRAZolam (XANAX) 0.25 MG tablet Take 0.25 mg by mouth daily as needed for anxiety.   12/11/2017  . buPROPion (WELLBUTRIN XL) 300 MG 24 hr tablet Take 300 mg by mouth daily.  3 12/13/2017  . cetirizine (ZYRTEC) 10 MG tablet Take 10 mg by mouth daily as needed for allergies.    November 2018  . Cholecalciferol (VITAMIN D-3) 5000 units TABS Take 5,000 Units by mouth daily.   12/13/2017  . naproxen sodium (ALEVE) 220 MG tablet Take 440 mg by mouth every 8 (eight) hours as needed (For joint pain.).   12/11/2017    Musculoskeletal: Strength & Muscle Tone: within normal limits Gait & Station: normal Patient leans: N/A  Psychiatric Specialty Exam: Physical Exam  Review of Systems  Constitutional: Negative.   HENT: Negative.   Eyes: Negative.   Respiratory: Negative.   Cardiovascular: Negative.   Gastrointestinal: Negative for nausea and vomiting.  Genitourinary: Negative.   Musculoskeletal: Negative.   Skin: Negative.   Neurological: Negative for seizures.  Endo/Heme/Allergies: Negative.    Psychiatric/Behavioral: Positive for depression and suicidal ideas. The patient is nervous/anxious.   All other systems reviewed and are negative.   Blood pressure 134/69, pulse 80, temperature 97.7 F (36.5 C), temperature source Oral, resp. rate 18, height 5' 11"  (1.803 m), weight (!) 151 kg (333 lb).Body mass index is 46.44 kg/m.  General Appearance: Fairly Groomed  Eye Contact:  Fair  Speech:  Normal Rate  Volume:  Normal  Mood:  vaguely depressed, anxious, but states feeling better than prior to admission  Affect:  anxious, labile, does smile briefly at times   Thought Process:  Linear and Descriptions of Associations: Intact  Orientation:  Other:  fully alert and attentive   Thought Content:  no hallucinations, no delusions, not internally preoccupied   Suicidal Thoughts:  No denies any suicidal or self injurious ideations, no homicidal or violent ideations  Homicidal Thoughts:  No  Memory:  recent and remote grossly intact   Judgement:  Fair  Insight:  Fair  Psychomotor Activity:  Normal  Concentration:  Concentration: Good and Attention Span: Good  Recall:  Good  Fund of Knowledge:  Good  Language:  Good  Akathisia:  Negative  Handed:  Right  AIMS (if indicated):     Assets:  Communication Skills Desire for Improvement Resilience  ADL's:  Intact  Cognition:  WNL  Sleep:  Number of Hours: 5.5    Treatment Plan Summary: Daily contact with patient to assess and evaluate symptoms and progress in treatment, Medication management, Plan inpatient treatment  and medications as below  Observation Level/Precautions:  15 minute checks  Laboratory:  as needed   Psychotherapy: milieu, group therapy    Medications: patient states he did very well on Wellbutrin for years, without side effects and that SSRI trials in the past were poorly tolerated due to sexual side effects. Agrees to restart Wellbutrin XL 150 mgrs QAM. We discussed  Buspar trial to address history of anxiety,  excessive worrying . Start Buspar 7.5 mgrs BID Start Ativan 0.5 mgrs Q 6 hours PRN for severe anxiety as needed   Consultations:  As needed   Discharge Concerns:  -   Estimated LOS: 4-5 days   Other:     Physician Treatment Plan for Primary Diagnosis: MDD, no psychotic features  Long Term Goal(s): Improvement in symptoms so as ready for discharge  Short Term Goals: Ability to identify changes in lifestyle to reduce recurrence of condition will improve, Ability to disclose and discuss suicidal ideas, Ability to demonstrate self-control will improve, Ability to identify and develop effective coping behaviors will improve and Ability to maintain clinical measurements within normal limits will improve  Physician Treatment Plan for Secondary Diagnosis:  Anxiety, consider  GAD , consider Adjustment Disorder with Depressed Mood and Anxiety  Long Term Goal(s): Improvement in symptoms so as ready for discharge  Short Term Goals: Ability to identify changes in lifestyle to reduce recurrence of condition will improve, Ability to verbalize feelings will improve and Ability to identify and develop effective coping behaviors will improve  I certify that inpatient services furnished can reasonably be expected to improve the patient's condition.    Jenne Campus, MD 2/19/20199:13 AM

## 2017-12-14 NOTE — [facility] Suicide Risk Assessment (Signed)
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 MDD (major depressive disorder), single episode, severe , no psychosis ([place]) [F32.2] 12/13/2017  . Morbid obesity due to excess calories ([place]) [E66.01] 03/04/2016  . Essential hypertension [I10] 03/04/2016  . Hypersomnia with sleep apnea [G47.10, G47.30] 01/14/2016  . Snoring [R06.83] 01/14/2016    Continued Clinical Symptoms:  Alcohol Use Disorder Identification Test Final Score (AUDIT): 3 The "Alcohol Use Disorders Identification Test", Guidelines for Use in Primary Care, Second Edition.  World Pharmacologist [facility]). Score between 0-7:  no or low risk or alcohol related problems. Score between 8-15:  moderate risk of alcohol related problems. Score between 16-19:  high risk of alcohol related problems. Score 20 or above:  warrants further diagnostic evaluation for alcohol dependence and treatment.   CLINICAL FACTORS:  37 year old married male, presents due to worsening depression, anxiety , recent suicidal ideations of running car off a cliff .    Psychiatric Specialty Exam: Physical Exam  ROS  Blood pressure 134/69, pulse 80, temperature 97.7 F (36.5 C), temperature source Oral, resp. rate 18, height 5\' 11"  (1.803 m), weight (!) 151 kg (333 lb).Body mass index is 46.44 kg/m.  See admit note MSE                                                        COGNITIVE FEATURES THAT  CONTRIBUTE TO RISK:  Closed-mindedness and Loss of executive function    SUICIDE RISK:   Moderate:  Frequent suicidal ideation with limited intensity, and duration, some specificity in terms of plans, no associated intent, good self-control, limited dysphoria/symptomatology, some risk factors present, and identifiable protective factors, including available and accessible social support.  PLAN OF CARE: Patient will be admitted to inpatient psychiatric unit for stabilization and safety. Will provide and encourage milieu participation. Provide medication management and maked adjustments as needed.  Will follow daily.    I certify that inpatient services furnished can reasonably be expected to improve the patient's condition.   Jenne Campus, MD 12/14/2017, 10:23 AM

## 2017-12-14 NOTE — Progress Notes (Signed)
Pt presents with an animated affect and anxious mood. Pt reported feeling anxious and frustrated on approach. Pt verbalized to writer that he's been seeking help but couldn't get it and now he's frustrating that he had to end up here to get treatment. Pt reported seeing his PCP for medication management for his anxiety and depression. Pt reported taking Wellbutrin for 10 years. Stopped taking Wellbutrin about a year ago because he had build a tolerance to it and it was less effective. Pt recently had a newborn baby and thought it would be a good idea to start back taking Wellbutrin. Pt reported feeling unhappy, having panic attacks, lack of sleep and lack of motivation. Pt verbalized passive SI with no plan or intent. Pt verbalized to writer that the passive suicidal thoughts were frightening to him and hates living like this. Pt denies active suicidal thoughts at this time. MD made aware of pt's complaints in tx team. Orders reviewed. Verbal support provided. Pt encouraged to attend groups. 15 minute checks performed for safety.

## 2017-12-14 NOTE — Progress Notes (Signed)
Adult Psychoeducational Group Note  Date:  12/14/2017 Time:  9:59 PM  Group Topic/Focus:  Wrap-Up Group:   The focus of this group is to help patients review their daily goal of treatment and discuss progress on daily workbooks.  Participation Level:  Active  Participation Quality:  Appropriate  Affect:  Appropriate  Cognitive:  Alert  Insight: Appropriate  Engagement in Group:  Engaged  Modes of Intervention:  Discussion  Additional Comments:  Patient stated having a better day. Patient's goal for today was to talk to the doctor. Patient met goal.  Randell Teare L Kerrington [place] 12/14/2017, 9:59 PM

## 2017-12-14 NOTE — Progress Notes (Signed)
Recreation Therapy Notes  Animal-Assisted Activity (AAA) Program Checklist/Progress Notes Patient Eligibility Criteria Checklist & Daily Group note for Rec TxIntervention  Date: 2.19.19 Time: 2:45 pm  Location: [address]   AAA/T Program Assumption of Risk Form signed by Patient/ or Parent Legal Guardian Yes  Patient is free of allergies or sever asthma Yes  Patient reports no fear of animals Yes  Patient reports no history of cruelty to animals Yes  Patient understands his/her participation is voluntary Yes  Behavioral Response: Patient did not attend   Ranell Patrick, Recreation Therapy Intern  Ranell Patrick 12/14/2017 3:06 PM
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
## 2017-12-14 NOTE — Progress Notes (Signed)
Alex [name] is a 37 year old male pt admitted on voluntary basis. On admission, [name] is cooperative and speaks about how he was on wellbutrin for a long time and went on a drug holiday because he felt the medication was not quite working any longer and spoke about how he recently started back up on the wellbutrin about 3 weeks ago but reports not feeling any better. He also cites having a 75 month old child and lack of sleep as a stressor currently. He denies any substance issues and reports that he has been taking his medications as prescribed. He spoke about how he would like to find a medication to help him so he does not feel depressed anymore. He reports that he lives with his wife and child and reports that he will go back there at discharge. Jerian was oriented to the unit and safety maintained.

## 2017-12-14 NOTE — Progress Notes (Signed)
Adult Psychoeducational Group Note  Date:  12/14/2017 Time:  1515 Group Topic/Focus:  Relaxation activities.   The purpose of this group is to help patients identify activities for coping with anxiety and depression.   Participation Level:  Active  Participation Quality:  Appropriate  Affect:  Appropriate  Cognitive:  Appropriate  Insight: Appropriate  Engagement in Group:  Engaged  Modes of Intervention:  Activity  Additional Comments:    Osama Coleson L 12/14/2017

## 2017-12-14 NOTE — Progress Notes (Signed)
Adult Psychoeducational Group Note  Date:  12/14/2017 Time:  0830 Group Topic/Focus:  Goals Group:   The focus of this group is to help patients establish daily goals to achieve during treatment and discuss how the patient can incorporate goal setting into their daily lives to aide in recovery.  Participation Level:  Active  Participation Quality:  Appropriate  Affect:  Appropriate  Cognitive:  Appropriate  Insight: Appropriate  Engagement in Group:  Engaged  Modes of Intervention:  Discussion and Orientation  Additional Comments:    Toney Sang 12/14/2017, 10:47 AM

## 2017-12-14 NOTE — Tx Team (Signed)
Initial Treatment Plan 12/14/2017 12:27 AM [name] KYH:[id number]    PATIENT STRESSORS: Other: newborn child   PATIENT STRENGTHS: Ability for insight Average or above average intelligence Capable of independent living Communication skills General fund of knowledge Motivation for treatment/growth Supportive family/friends   PATIENT IDENTIFIED PROBLEMS: Depression Suicidal thoughts "I don't want to feel like this anymore"                     DISCHARGE CRITERIA:  Ability to meet basic life and health needs Improved stabilization in mood, thinking, and/or behavior Reduction of life-threatening or endangering symptoms to within safe limits Verbal commitment to aftercare and medication compliance  PRELIMINARY DISCHARGE PLAN: Attend aftercare/continuing care group Return to previous living arrangement  PATIENT/FAMILY INVOLVEMENT: This treatment plan has been presented to and reviewed with the patient, Alex [name], and/or family member, .  The patient and family have been given the opportunity to ask questions and make suggestions.  [place], [place], [place] 12/14/2017, 12:27 AM

## 2017-12-14 NOTE — BHH Group Notes (Signed)
[place] LCSW Group Therapy Note  Date/Time: 12/14/17, 1315  Type of Therapy/Topic:  Group Therapy:  Feelings about Diagnosis  Participation Level:  Minimal   Mood:pleasant   Description of Group:    This group will allow patients to explore their thoughts and feelings about diagnoses they have received. Patients will be guided to explore their level of understanding and acceptance of these diagnoses. Facilitator will encourage patients to process their thoughts and feelings about the reactions of others to their diagnosis, and will guide patients in identifying ways to discuss their diagnosis with significant others in their lives. This group will be process-oriented, with patients participating in exploration of their own experiences as well as giving and receiving support and challenge from other group members.   Therapeutic Goals: 1. Patient will demonstrate understanding of diagnosis as evidence by identifying two or more symptoms of the disorder:  2. Patient will be able to express two feelings regarding the diagnosis 3. Patient will demonstrate ability to communicate their needs through discussion and/or role plays  Summary of Patient Progress:Pt was not active in group discussion today regarding diagnoses, symptoms, and mental health stigma, however, he was attentive during group and did make one or two comments.          Therapeutic Modalities:   Cognitive Behavioral Therapy Brief Therapy Feelings Identification   Lurline Idol, LCSW

## 2017-12-14 NOTE — Progress Notes (Signed)
Adult Psychoeducational Group Note  Date:  12/14/2017 Time: 1615 Group Topic/Focus:  Overcoming Stress:   The focus of this group is to define stress and help patients assess their triggers.  Participation Level:  Active  Participation Quality:  Appropriate  Affect:  Appropriate  Cognitive:  Appropriate  Insight: Appropriate  Engagement in Group:  Engaged  Modes of Intervention:  Discussion, Education and Support  Additional Comments:   Group focus on grounding exercises Alex [name] 12/14/2017, 5:22 PM

## 2017-12-14 NOTE — Plan of Care (Signed)
  Progressing Activity: Interest or engagement in activities will improve 12/14/2017 1129 - Progressing by Marissa Calamity, RN Coping: Ability to verbalize frustrations and anger appropriately will improve 12/14/2017 1129 - Progressing by Marissa Calamity, RN   Progressing Activity: Interest or engagement in activities will improve 12/14/2017 1129 - Progressing by Marissa Calamity, RN Coping: Ability to verbalize frustrations and anger appropriately will improve 12/14/2017 1129 - Progressing by Marissa Calamity, RN   Progressing Activity: Interest or engagement in activities will improve 12/14/2017 1129 - Progressing by Marissa Calamity, RN Coping: Ability to verbalize frustrations and anger appropriately will improve 12/14/2017 1129 - Progressing by Marissa Calamity, RN

## 2017-12-14 NOTE — Progress Notes (Signed)
  DATA ACTION RESPONSE  Objective- Pt. is visible in the dayroom, seen interacting with peers and playing cards.Presents with an animated/anxious affect and mood. Pt states he hopes to get anxiety under control.  Subjective- Denies having any SI/HI/AVH/Pain at this time. Is cooperative and remains safe on the unit.  1:1 interaction in private to establish rapport. Encouragement, education, & support given from staff.    Safety maintained with Q 15 checks. Continue with POC.

## 2017-12-15 DIAGNOSIS — F332 Major depressive disorder, recurrent severe without psychotic features: Principal | ICD-10-CM

## 2017-12-15 DIAGNOSIS — Z818 Family history of other mental and behavioral disorders: Secondary | ICD-10-CM

## 2017-12-15 DIAGNOSIS — G4700 Insomnia, unspecified: Secondary | ICD-10-CM

## 2017-12-15 DIAGNOSIS — F419 Anxiety disorder, unspecified: Secondary | ICD-10-CM

## 2017-12-15 LAB — TSH: TSH: 2.165 u[IU]/mL (ref 0.350–4.500)

## 2017-12-15 MED ORDER — HYDROXYZINE HCL 50 MG PO TABS
50.0000 mg | ORAL_TABLET | Freq: Every day | ORAL | Status: DC
  Filled 2017-12-15 (×3): qty 1

## 2017-12-15 MED ORDER — BUPROPION HCL ER (XL) 150 MG PO TB24
150.0000 mg | ORAL_TABLET | Freq: Once | ORAL | Status: AC
  Administered 2017-12-15: 150 mg via ORAL
  Filled 2017-12-15: qty 1

## 2017-12-15 MED ORDER — HYDROXYZINE HCL 25 MG PO TABS
25.0000 mg | ORAL_TABLET | Freq: Three times a day (TID) | ORAL | Status: DC | PRN
  Administered 2017-12-15: 25 mg via ORAL
  Filled 2017-12-15: qty 1

## 2017-12-15 MED ORDER — BUPROPION HCL ER (XL) 300 MG PO TB24
300.0000 mg | ORAL_TABLET | Freq: Every day | ORAL | Status: DC
  Administered 2017-12-16: 300 mg via ORAL
  Filled 2017-12-15 (×4): qty 1

## 2017-12-15 NOTE — Progress Notes (Signed)
Adult Psychoeducational Group Note  Date:  12/15/2017 Time:  0830 Group Topic/Focus:  Goals Group:   The focus of this group is to help patients establish daily goals to achieve during treatment and discuss how the patient can incorporate goal setting into their daily lives to aide in recovery.  Participation Level:  Active  Participation Quality:  Appropriate  Affect:  Appropriate  Cognitive:  Appropriate  Insight: Appropriate  Engagement in Group:  Engaged  Modes of Intervention:  Discussion, Orientation and Rapport Building  Additional Comments:  Pt attended orientation/goal group  Larkin Ina Patience 12/15/2017, 11:05 AM

## 2017-12-15 NOTE — Tx Team (Signed)
Interdisciplinary Treatment and Diagnostic Plan Update  12/15/2017 Time of Session: 1135 Alex [name] MRN: [id number]  Principal Diagnosis: <principal problem not specified>  Secondary Diagnoses: Active Problems:   MDD (major depressive disorder), single episode, severe , no psychosis ([place])   Current Medications:  Current Facility-Administered Medications  Medication Dose Route Frequency Provider Last Rate Last Dose  . acetaminophen (TYLENOL) tablet 650 mg  650 mg Oral Q6H PRN Ethelene Hal, NP      . alum & mag hydroxide-simeth (MAALOX/MYLANTA) 200-200-20 MG/5ML suspension 30 mL  30 mL Oral Q4H PRN Ethelene Hal, NP      . Derrill Memo ON 12/16/2017] buPROPion (WELLBUTRIN XL) 24 hr tablet 300 mg  300 mg Oral Daily Money, Travis B, FNP      . busPIRone (BUSPAR) tablet 7.5 mg  7.5 mg Oral BID Cobos, Myer Peer, MD   7.5 mg at 12/15/17 0758  . hydrOXYzine (ATARAX/VISTARIL) tablet 25 mg  25 mg Oral TID PRN Money, Lowry Ram, FNP      . magnesium hydroxide (MILK OF MAGNESIA) suspension 30 mL  30 mL Oral Daily PRN Ethelene Hal, NP      . traZODone (DESYREL) tablet 50 mg  50 mg Oral QHS PRN Ethelene Hal, NP       PTA Medications: Medications Prior to Admission  Medication Sig Dispense Refill Last Dose  . albuterol (PROVENTIL HFA;VENTOLIN HFA) 108 (90 Base) MCG/ACT inhaler Inhale 2 puffs into the lungs every 4 (four) hours as needed for wheezing or shortness of breath.   12/05/2017  . ALPRAZolam (XANAX) 0.25 MG tablet Take 0.25 mg by mouth daily as needed for anxiety.   12/11/2017  . buPROPion (WELLBUTRIN XL) 300 MG 24 hr tablet Take 300 mg by mouth daily.  3 12/13/2017  . cetirizine (ZYRTEC) 10 MG tablet Take 10 mg by mouth daily as needed for allergies.    November 2018  . Cholecalciferol (VITAMIN D-3) 5000 units TABS Take 5,000 Units by mouth daily.   12/13/2017  . naproxen sodium (ALEVE) 220 MG tablet Take 440 mg by mouth every 8 (eight) hours as needed (For  joint pain.).   12/11/2017    Patient Stressors: Other: newborn child  Patient Strengths: Ability for insight Average or above average intelligence Capable of independent living Communication skills General fund of knowledge Motivation for treatment/growth Supportive family/friends  Treatment Modalities: Medication Management, Group therapy, Case management,  1 to 1 session with clinician, Psychoeducation, Recreational therapy.   Physician Treatment Plan for Primary Diagnosis: <principal problem not specified> Long Term Goal(s): Improvement in symptoms so as ready for discharge Improvement in symptoms so as ready for discharge   Short Term Goals: Ability to identify changes in lifestyle to reduce recurrence of condition will improve Ability to disclose and discuss suicidal ideas Ability to demonstrate self-control will improve Ability to identify and develop effective coping behaviors will improve Ability to maintain clinical measurements within normal limits will improve Ability to identify changes in lifestyle to reduce recurrence of condition will improve Ability to verbalize feelings will improve Ability to identify and develop effective coping behaviors will improve  Medication Management: Evaluate patient's response, side effects, and tolerance of medication regimen.  Therapeutic Interventions: 1 to 1 sessions, Unit Group sessions and Medication administration.  Evaluation of Outcomes: Progressing  Physician Treatment Plan for Secondary Diagnosis: Active Problems:   MDD (major depressive disorder), single episode, severe , no psychosis ([place])  Long Term Goal(s): Improvement in symptoms so as ready  for discharge Improvement in symptoms so as ready for discharge   Short Term Goals: Ability to identify changes in lifestyle to reduce recurrence of condition will improve Ability to disclose and discuss suicidal ideas Ability to demonstrate self-control will improve Ability  to identify and develop effective coping behaviors will improve Ability to maintain clinical measurements within normal limits will improve Ability to identify changes in lifestyle to reduce recurrence of condition will improve Ability to verbalize feelings will improve Ability to identify and develop effective coping behaviors will improve     Medication Management: Evaluate patient's response, side effects, and tolerance of medication regimen.  Therapeutic Interventions: 1 to 1 sessions, Unit Group sessions and Medication administration.  Evaluation of Outcomes: Progressing   RN Treatment Plan for Primary Diagnosis: <principal problem not specified> Long Term Goal(s): Knowledge of disease and therapeutic regimen to maintain health will improve  Short Term Goals: Ability to identify and develop effective coping behaviors will improve and Compliance with prescribed medications will improve  Medication Management: RN will administer medications as ordered by provider, will assess and evaluate patient's response and provide education to patient for prescribed medication. RN will report any adverse and/or side effects to prescribing provider.  Therapeutic Interventions: 1 on 1 counseling sessions, Psychoeducation, Medication administration, Evaluate responses to treatment, Monitor vital signs and CBGs as ordered, Perform/monitor CIWA, COWS, AIMS and Fall Risk screenings as ordered, Perform wound care treatments as ordered.  Evaluation of Outcomes: Progressing   LCSW Treatment Plan for Primary Diagnosis: <principal problem not specified> Long Term Goal(s): Safe transition to appropriate next level of care at discharge, Engage patient in therapeutic group addressing interpersonal concerns.  Short Term Goals: Engage patient in aftercare planning with referrals and resources, Increase social support and Increase skills for wellness and recovery  Therapeutic Interventions: Assess for all  discharge needs, 1 to 1 time with Social worker, Explore available resources and support systems, Assess for adequacy in community support network, Educate family and significant other(s) on suicide prevention, Complete Psychosocial Assessment, Interpersonal group therapy.  Evaluation of Outcomes: Progressing   Progress in Treatment: Attending groups: Yes. Participating in groups: Yes. Taking medication as prescribed: Yes. Toleration medication: Yes. Family/Significant other contact made: No, will contact:  wife Patient understands diagnosis: Yes. Discussing patient identified problems/goals with staff: Yes. Medical problems stabilized or resolved: Yes. Denies suicidal/homicidal ideation: Yes. Issues/concerns per patient self-inventory: No. Other: none  New problem(s) identified: No, Describe:  none  New Short Term/Long Term Goal(s):Pt goal: get the medications I need, get an outpt psychiatrist.  Discharge Plan or Barriers:   Reason for Continuation of Hospitalization: Depression Medication stabilization  Estimated Length of Stay: 2-4 days. Attendees: Patient: Alex [name] 12/15/2017   Physician: Dr [name] Poisson, MD 12/15/2017   Nursing: Grayland Ormond, RN 12/15/2017   RN Care Manager: 12/15/2017   Social Worker: Lurline Idol, LCSW 12/15/2017   Recreational Therapist:  12/15/2017   Other:  12/15/2017   Other:  12/15/2017   Other: 12/15/2017         Scribe for Treatment Team: Joanne Chars, LCSW 12/15/2017 1:15 PM

## 2017-12-15 NOTE — Progress Notes (Signed)
Pt presents with a flat affect and anxious mood. Pt reported ongoing anxiety this morning. Pt expressed using different coping skills to relieve anxiety and maintain control. Pt reports decreased symptoms of depression today. Pt reports fair sleep last night. Pt denies SI/HI. Pt expressed concerns about how come he's not taking Wellbutrin 300 mg daily here if that's what he was taking at home. Writer made tx team aware of pt request. Per Darnelle Maffucci, NP., give pt another one time dose of Wellbutrin 150 mg. Medications reviewed with pt. Medications administered as ordered per MD. Verbal support provided. Pt encouraged to attend groups. 15 minute checks performed for safety.

## 2017-12-15 NOTE — BHH Group Notes (Signed)
[place] Group Notes:  (Nursing/MHT/Case Management/Adjunct)  Date:  12/15/2017  Time:  1:30 p.m.  Type of Therapy:  Psychoeducational Skills  Participation Level:  Active  Participation Quality:  Appropriate  Affect:  Appropriate  Cognitive:  Appropriate  Insight:  Appropriate  Engagement in Group:  Engaged  Modes of Intervention:  Education  Summary of Progress/Problems:  Patient was attentive and involved with therapeutic relaxation group.  Cammy Copa 12/15/2017, 2:49 PM

## 2017-12-15 NOTE — Progress Notes (Signed)
Adult Psychoeducational Group Note  Date:  12/15/2017 Time:  10:21 PM  Group Topic/Focus:  Wrap-Up Group:   The focus of this group is to help patients review their daily goal of treatment and discuss progress on daily workbooks.  Participation Level:  Active  Participation Quality:  Appropriate  Affect:  Appropriate  Cognitive:  Alert  Insight: Appropriate  Engagement in Group:  Engaged  Modes of Intervention:  Discussion  Additional Comments:  Patient stated having a good day. Patient's goal for today was to (1) to get a better idea of a time frame of how long he will be here. (2) to get medication adjusted.   Rutherford Alarie L Arlana Canizales 12/15/2017, 10:21 PM

## 2017-12-15 NOTE — BHH Suicide Risk Assessment (Signed)
[place] INPATIENT:  Family/Significant Other Suicide Prevention Education  Suicide Prevention Education:  Education Completed; Arth Nicastro, wife, [phone], has been identified by the patient as the family member/significant other with whom the patient will be residing, and identified as the person(s) who will aid the patient in the event of a mental health crisis (suicidal ideations/suicide attempt).  With written consent from the patient, the family member/significant other has been provided the following suicide prevention education, prior to the and/or following the discharge of the patient.  The suicide prevention education provided includes the following:  Suicide risk factors  Suicide prevention and interventions  National Suicide Hotline telephone number  [facility] assessment telephone number  [facility] Emergency Assistance [place] and/or Residential Mobile Crisis Unit telephone number  Request made of family/significant other to:  Remove weapons (e.g., guns, rifles, knives), all items previously/currently identified as safety concern.    Remove drugs/medications (over-the-counter, prescriptions, illicit drugs), all items previously/currently identified as a safety concern.  The family member/significant other verbalizes understanding of the suicide prevention education information provided.  The family member/significant other agrees to remove the items of safety concern listed above.  Wife reports pt has always had problems with anxiety and depression.  Medication helped, pt went off meds when they were trying to get pregnant.  Pt did get back on wellbutrin recently--helped with the low energy he was having but not with the depressed mood.    Joanne Chars, LCSW 12/15/2017, 1:45 PM

## 2017-12-15 NOTE — Progress Notes (Signed)
Recreation Therapy Notes  Date: 12/15/17 Time: 0930 Location: 300 Hall Dayroom  Group Topic: Stress Management  Goal Area(s) Addresses:  Patient will verbalize importance of using healthy stress management.  Patient will identify positive emotions associated with healthy stress management.   Intervention: Stress Management  Activity :  [facility].  LRT introduced the stress management technique of guided imagery.  LRT read a script to allow patients to visualize the sights and sounds of being in the forest.  Patients were to follow along as the script was read to participate in activity.  Education: Stress Management, Discharge Planning.   Education Outcome: Acknowledges edcuation/In group clarification offered/Needs additional education  Clinical Observations/Feedback: Pt did not attend group.    Victorino Sparrow, LRT/CTRS         Ria Comment, Shalandria Elsbernd A 12/15/2017 1:11 PM

## 2017-12-15 NOTE — Progress Notes (Signed)
Adult Psychoeducational Group Note  Date:  12/15/2017 Time:  1600 Group Topic/Focus:  Identifying Needs:   The focus of this group is to help patients identify their personal needs that have been historically problematic and identify healthy behaviors to address their needs. Making Healthy Choices:   The focus of this group is to help patients identify negative/unhealthy choices they were using prior to admission and identify positive/healthier coping strategies to replace them upon discharge.  Participation Level:  Minimal  Participation Quality:  Attentive  Affect:  Appropriate  Cognitive:  Appropriate  Insight: Good  Engagement in Group:  Engaged  Modes of Intervention:  Discussion, Role-play and Support  Additional Comments:    Toney Sang 12/15/2017, 5:18 PM

## 2017-12-15 NOTE — BHH Group Notes (Signed)
[facility] Group Therapy                                                             12/15/2017  2:54 PM  Type of Therapy: Mental Health Association Presentation  Participation Level: Active   Participation Quality: Attentive  Affect: Appropriate  Cognitive: Oriented  Insight: Developing/Improving  Engagement in Therapy: Engaged  Modes of Intervention: Discussion, Education and Socialization  Summary of Progress/Problems: Pateint was attentive and involved with the group  Verdis Frederickson, SW Intern

## 2017-12-15 NOTE — Progress Notes (Addendum)
[facility] MD Progress Note  12/15/2017 2:48 PM Alex [name]  MRN:  [id number]   Subjective:  Patient reports that he is doing good today, but would like his Wellbutrin increased to the original dose of 300 mg. He also reports that he would like to be off of controlled substances for anxiety and he has only needed the Ativan 1-2 times in 6-7 months. He denies any medication side effects and denies any SI/HI/AVH and contracts for safety. He reports that he does not has an outpatient psychiatrist and would like for the CSW to arrange an appointment for him before discharge. He plans to return to his home with his girlfriend and he states that we may call her for collateral.   Objective: Patient's chart and findings reviewed and discussed with treatment team. Patients pleasant and cooperative. He is expected to discharge soon. He has been in the day room interacting appropriately. He has been attending groups. CSW notified about request for outpatient services. Will increase the Wellbutrin XL to 300 mg Daily and will discontinue the Ativan . Will restart the Vistaril for anxiety.  Principal Problem: MDD (major depressive disorder), recurrent severe, without psychosis ([place]) Diagnosis:   Patient Active Problem List   Diagnosis Date Noted  . MDD (major depressive disorder), recurrent severe, without psychosis ([place]) [F33.2] 12/13/2017  . Morbid obesity due to excess calories ([place]) [E66.01] 03/04/2016  . Essential hypertension [I10] 03/04/2016  . Hypersomnia with sleep apnea [G47.10, G47.30] 01/14/2016  . Snoring [R06.83] 01/14/2016   Total Time spent with patient: 30 minutes  Past Psychiatric History: See H&P  Past Medical History:  Past Medical History:  Diagnosis Date  . Anxiety   . Asthma    exercise-induced  . Depression    History reviewed. No pertinent surgical history. Family History:  Family History  Problem Relation Age of Onset  . Hypertension Father    Family Psychiatric   History: See H&P Social History:  Social History   Substance and Sexual Activity  Alcohol Use Yes  . Alcohol/week: 1.2 oz  . Types: 2 Standard drinks or equivalent per week   Comment: occasional     Social History   Substance and Sexual Activity  Drug Use No    Social History   Socioeconomic History  . Marital status: Married    Spouse name: None  . Number of children: None  . Years of education: None  . Highest education level: None  Social Needs  . Financial resource strain: None  . Food insecurity - worry: None  . Food insecurity - inability: None  . Transportation needs - medical: None  . Transportation needs - non-medical: None  Occupational History  . None  Tobacco Use  . Smoking status: Never Smoker  . Smokeless tobacco: Never Used  Substance and Sexual Activity  . Alcohol use: Yes    Alcohol/week: 1.2 oz    Types: 2 Standard drinks or equivalent per week    Comment: occasional  . Drug use: No  . Sexual activity: Yes  Other Topics Concern  . None  Social History Narrative   Drinks 3 cups of caffeine daily.   Additional Social History:                         Sleep: Good  Appetite:  Good  Current Medications: Current Facility-Administered Medications  Medication Dose Route Frequency Provider Last Rate Last Dose  . acetaminophen (TYLENOL) tablet 650 mg  650 mg  Oral Q6H PRN Ethelene Hal, NP      . alum & mag hydroxide-simeth (MAALOX/MYLANTA) 200-200-20 MG/5ML suspension 30 mL  30 mL Oral Q4H PRN Ethelene Hal, NP      . Derrill Memo ON 12/16/2017] buPROPion (WELLBUTRIN XL) 24 hr tablet 300 mg  300 mg Oral Daily Money, Travis B, FNP      . busPIRone (BUSPAR) tablet 7.5 mg  7.5 mg Oral BID Dorri Ozturk, Myer Peer, MD   7.5 mg at 12/15/17 0758  . hydrOXYzine (ATARAX/VISTARIL) tablet 25 mg  25 mg Oral TID PRN Money, Lowry Ram, FNP   25 mg at 12/15/17 1439  . magnesium hydroxide (MILK OF MAGNESIA) suspension 30 mL  30 mL Oral Daily PRN Ethelene Hal, NP      . traZODone (DESYREL) tablet 50 mg  50 mg Oral QHS PRN Ethelene Hal, NP        Lab Results:  Results for orders placed or performed during the hospital encounter of 12/13/17 (from the past 48 hour(s))  TSH     Status: None   Collection Time: 12/15/17  6:14 AM  Result Value Ref Range   TSH 2.165 0.350 - 4.500 uIU/mL    Comment: Performed by a 3rd Generation assay with a functional sensitivity of <=0.01 uIU/mL. Performed at [facility], [place] [address], [place], [place] [address]     Blood Alcohol level:  Lab Results  Component Value Date   ETH <10 95/18/8416    Metabolic Disorder Labs: No results found for: HGBA1C, MPG No results found for: PROLACTIN No results found for: CHOL, TRIG, HDL, CHOLHDL, VLDL, LDLCALC  Physical Findings: AIMS: Facial and Oral Movements Muscles of Facial Expression: None, normal Lips and Perioral Area: None, normal Jaw: None, normal Tongue: None, normal,Extremity Movements Upper (arms, wrists, hands, fingers): None, normal Lower (legs, knees, ankles, toes): None, normal, Trunk Movements Neck, shoulders, hips: None, normal, Overall Severity Severity of abnormal movements (highest score from questions above): None, normal Incapacitation due to abnormal movements: None, normal Patient's awareness of abnormal movements (rate only patient's report): No Awareness, Dental Status Current problems with teeth and/or dentures?: No Does patient usually wear dentures?: No  CIWA:    COWS:     Musculoskeletal: Strength & Muscle Tone: within normal limits Gait & Station: normal Patient leans: Backward  Psychiatric Specialty Exam: Physical Exam  Nursing note and vitals reviewed. Constitutional: He is oriented to person, place, and time. He appears well-developed and well-nourished.  Cardiovascular: Normal rate.  Respiratory: Effort normal.  Musculoskeletal: Normal range of motion.  Neurological: He is  alert and oriented to person, place, and time.  Skin: Skin is warm.    Review of Systems  Constitutional: Negative.   HENT: Negative.   Eyes: Negative.   Respiratory: Negative.   Cardiovascular: Negative.   Gastrointestinal: Negative.   Genitourinary: Negative.   Musculoskeletal: Negative.   Skin: Negative.   Neurological: Negative.   Endo/Heme/Allergies: Negative.   Psychiatric/Behavioral: Negative.     Blood pressure 136/74, pulse 82, temperature 97.9 F (36.6 C), temperature source Oral, resp. rate 16, height 5\' 11"  (1.803 m), weight (!) 151 kg (333 lb).Body mass index is 46.44 kg/m.  General Appearance: Casual  Eye Contact:  Good  Speech:  Clear and Coherent and Normal Rate  Volume:  Normal  Mood:  Euthymic  Affect:  Congruent  Thought Process:  Goal Directed and Descriptions of Associations: Intact  Orientation:  Full (Time, Place, and Person)  Thought Content:  WDL  Suicidal Thoughts:  No  Homicidal Thoughts:  No  Memory:  Immediate;   Good Recent;   Good Remote;   Good  Judgement:  Good  Insight:  Good  Psychomotor Activity:  Normal  Concentration:  Concentration: Good and Attention Span: Good  Recall:  Good  Fund of Knowledge:  Good  Language:  Good  Akathisia:  No  Handed:  Right  AIMS (if indicated):     Assets:  Communication Skills Desire for Improvement Financial Resources/Insurance Housing Physical Health Social Support Transportation  ADL's:  Intact  Cognition:  WNL  Sleep:  Number of Hours: 6.75   Problems Addressed: MDD severe  Treatment Plan Summary: Daily contact with patient to assess and evaluate symptoms and progress in treatment, Medication management and Plan is to:  -Increase Wellbutrin XL 300 mg PO Daily for mood stability -Discontinue Ativan -Start Vistaril 25 mg PO Q6H PRN for anxiety -Continue Buspar 7.5 mg PO BID for anxiety -Continue Trazodone 100 mg PO QHS PRN for insomnia -Encourage group therapy participation  Lewis Shock, FNP 12/15/2017, 2:48 PM   Agree with NP Progress Note

## 2017-12-16 DIAGNOSIS — F322 Major depressive disorder, single episode, severe without psychotic features: Secondary | ICD-10-CM

## 2017-12-16 MED ORDER — BUSPIRONE HCL 7.5 MG PO TABS: 7.5000 mg | ORAL_TABLET | Freq: Two times a day (BID) | ORAL | 0 refills | Status: DC

## 2017-12-16 MED ORDER — HYDROXYZINE HCL 25 MG PO TABS: 25.0000 mg | ORAL_TABLET | Freq: Three times a day (TID) | ORAL | 0 refills | Status: DC | PRN

## 2017-12-16 MED ORDER — BUPROPION HCL ER (XL) 300 MG PO TB24: 300.0000 mg | ORAL_TABLET | Freq: Every day | ORAL | 0 refills | Status: DC

## 2017-12-16 MED ORDER — TRAZODONE HCL 50 MG PO TABS: 50.0000 mg | ORAL_TABLET | Freq: Every evening | ORAL | 0 refills | Status: DC | PRN

## 2017-12-16 MED ORDER — HYDROXYZINE HCL 50 MG PO TABS: 50.0000 mg | ORAL_TABLET | Freq: Every day | ORAL | 0 refills | Status: DC

## 2017-12-16 MED FILL — hydrOXYzine HCL 25 MG TABS: 25 | 20 days supply | Qty: 60 | Fill #0

## 2017-12-16 MED FILL — traZODone HCL 50 MG TABS: 50 | 30 days supply | Qty: 30 | Fill #0

## 2017-12-16 MED FILL — busPIRone HCL 7.5 MG TABS: 7.5 | 30 days supply | Qty: 60 | Fill #0

## 2017-12-16 MED FILL — hydrOXYzine HCL 50 MG TABS: 50 | 30 days supply | Qty: 30 | Fill #0

## 2017-12-16 NOTE — [facility] Suicide Risk Assessment (Addendum)
[facility] Discharge Suicide Risk Assessment   Principal Problem: MDD (major depressive disorder), recurrent severe, without psychosis ([place]) Discharge Diagnoses:  Patient Active Problem List   Diagnosis Date Noted  . MDD (major depressive disorder), recurrent severe, without psychosis ([place]) [F33.2] 12/13/2017  . Morbid obesity due to excess calories ([place]) [E66.01] 03/04/2016  . Essential hypertension [I10] 03/04/2016  . Hypersomnia with sleep apnea [G47.10, G47.30] 01/14/2016  . Snoring [R06.83] 01/14/2016    Total Time spent with patient: 30 minutes  Musculoskeletal: Strength & Muscle Tone: within normal limits Gait & Station: normal Patient leans: N/A  Psychiatric Specialty Exam: ROS mild headache, no chest pain, no dyspnea, no vomiting, no fever, no chills .  Blood pressure 124/66, pulse 82, temperature 98.1 F (36.7 C), temperature source Oral, resp. rate 16, height 5\' 11"  (1.803 m), weight (!) 151 kg (333 lb).Body mass index is 46.44 kg/m.  General Appearance: Well Groomed  Eye Contact::  Good  Speech:  Normal [id number]  Volume:  Normal  Mood:  improved, states he is feeling " a lot better". States " I feel a lot more positive "  Affect:  Appropriate and Full Range  Thought Process:  Linear and Descriptions of Associations: Intact  Orientation:  Full (Time, Place, and Person)  Thought Content:  no hallucinations, no delusions, not internally preoccupied   Suicidal Thoughts:  No denies any suicidal or self injurious ideations, denies any homicidal or violent ideations  Homicidal Thoughts:  No  Memory:  recent and remote grossly intact   Judgement:  Other:  improved   Insight:  improved   Psychomotor Activity:  Normal  Concentration:  Good  Recall:  Good  Fund of Knowledge:Good  Language: Good  Akathisia:  Negative  Handed:  Right  AIMS (if indicated):     Assets:  Communication Skills Desire for Improvement Resilience  Sleep:  Number of Hours: 6.5  Cognition: WNL   ADL's:  Intact   Mental Status Per Nursing Assessment::   On Admission:     Demographic Factors:  37 year old married male, employed [employer]) , has a 48 week old child  Loss Factors: No specific stressors, wife recently had child  Historical Factors: No history of prior psychiatric admissions, no history of suicide attempts, history of depression, anxiety, which he describes as worrying excessively .  Risk Reduction Factors:   Responsible for children under 9 years of age, Sense of responsibility to family, Employed, Living with another person, especially a relative, Positive social support and Positive coping skills or problem solving skills  Continued Clinical Symptoms:  At this time patient is alert, attentive, well related, calm, mood improved , denies feeling depressed at this time and affect is appropriate, reactive, no thought disorder , no suicidal or self injurious ideations, denies homicidal ideations, no psychotic symptoms, future oriented . Denies current medication side effects. Behavior on unit in good control, pleasant on approach. With his express consent I spoke with his wife via phone, who corroborates patient seems much improved and who is in agreement that he is ready for discharge today, she states she will pick him up later today. Of note, patient has elevated AST , ALT with no associated symptoms. He states that he has had viral hepatitis work up via his work and has been negative. Suspects fatty liver- will follow up with his PCP for monitoring .  Cognitive Features That Contribute To Risk:  No gross cognitive deficits noted upon discharge. Is alert , attentive, and oriented  x 3   Suicide Risk:  Mild:  Suicidal ideation of limited frequency, intensity, duration, and specificity.  There are no identifiable plans, no associated intent, mild dysphoria and related symptoms, good self-control (both objective and subjective assessment), few other risk factors, and  identifiable protective factors, including available and accessible social support.  [place]. Go on 12/24/2017.   Why:  Please attend your medication appt with Dr. Toy Cookey on Friday, 12/24/16, 12:30pm Contact information: [place] [address] [phone]        [facility]. Go on 12/24/2017.   Why:  Please attend your therapy appt with Leamon Arnt on Friday, 12/24/17, at 9:00am. Contact information: [address] [place], [place] [address] P: [phone] F: [phone]          Plan Of Care/Follow-up recommendations:  Activity:  as tolerated  Diet:  regular Tests:  NA Other:  see below  Patient is expressing significant improvement and readiness for discharge-no current grounds for involuntary commitment  He is leaving unit in good spirits  Plans to return home Plans to follow up as above He also has an established PCP for medical issues as needed. Patient plans to follow up with him regarding elevated LFTs .  Jenne Campus, MD 12/16/2017, 10:38 AM

## 2017-12-16 NOTE — Discharge Summary (Addendum)
Physician Discharge Summary Note  Patient:  Alex [name] is an 37 y.o., male MRN:  [id number] DOB:  [birth year] Patient phone:  [phone] (home)  Patient address:   [place] [address],  Total Time spent with patient: 20 minutes  Date of Admission:  12/13/2017 Date of Discharge: 12/16/17  Reason for Admission:  Worsening depression with SI  Principal Problem: MDD (major depressive disorder), recurrent severe, without psychosis [facility]) Discharge Diagnoses: Patient Active Problem List   Diagnosis Date Noted  . MDD (major depressive disorder), single episode, severe , no psychosis ([place]) [F32.2]   . MDD (major depressive disorder), recurrent severe, without psychosis ([place]) [F33.2] 12/13/2017  . Morbid obesity due to excess calories ([place]) [E66.01] 03/04/2016  . Essential hypertension [I10] 03/04/2016  . Hypersomnia with sleep apnea [G47.10, G47.30] 01/14/2016  . Snoring [R06.83] 01/14/2016    Past Psychiatric History: denies history of suicidal attempts , denies history of self cutting or self injurious behaviors, no history of psychosis . No clear history of mania or hypomania . Denies history of psychosis.  Endorses history of depression and of anxiety. Reports history of prior depressive episodes . No prior psychiatric admissions  Past Medical History:  Past Medical History:  Diagnosis Date  . Anxiety   . Asthma    exercise-induced  . Depression    History reviewed. No pertinent surgical history. Family History:  Family History  Problem Relation Age of Onset  . Hypertension Father    Family Psychiatric  History: states that there is a history of anxiety and depression in several members of the family. No alcohol abuse in family. No suicides in family  Social History:  Social History   Substance and Sexual Activity  Alcohol Use Yes  . Alcohol/week: 1.2 oz  . Types: 2 Standard drinks or equivalent per week   Comment: occasional     Social  History   Substance and Sexual Activity  Drug Use No    Social History   Socioeconomic History  . Marital status: Married    Spouse name: None  . Number of children: None  . Years of education: None  . Highest education level: None  Social Needs  . Financial resource strain: None  . Food insecurity - worry: None  . Food insecurity - inability: None  . Transportation needs - medical: None  . Transportation needs - non-medical: None  Occupational History  . None  Tobacco Use  . Smoking status: Never Smoker  . Smokeless tobacco: Never Used  Substance and Sexual Activity  . Alcohol use: Yes    Alcohol/week: 1.2 oz    Types: 2 Standard drinks or equivalent per week    Comment: occasional  . Drug use: No  . Sexual activity: Yes  Other Topics Concern  . None  Social History Narrative   Drinks 3 cups of caffeine daily.    Hospital Course:   12/14/17 [facility] MD Assessment: 37 year old married  male . He presented voluntarily  to the ED on 2/18 due to worsening depression, anxiety, panic attacks, and states that a few days prior to admission had suicidal ideations with thoughts of running car off a cliff, although denies having had any intention. States " I feel like I have been having an existential crisis", ruminating about how his life has changed and will change due to having a child. States " I started having thoughts about the meaning and the purpose of my life". Reports these thoughts often  led to increased anxiety and panic .  Reports long history of worrying " a lot", and states he feels he has been worrying excessively recently.Endorses neuro-vegetative symptoms as below. Denies psychotic symptoms. He reports he has been off psychiatric medications ( Wellbutrin, which he had been taking for several years ) for about a year .  Major stressors include having a new born child Patient states he did very well on Wellbutrin for years, without side effects and that SSRI trials in the  past were poorly tolerated due to sexual side effects. Agrees to restart Wellbutrin XL and titrate to 300 mgrs QAM. We discussed  Buspar trial to address history of anxiety, excessive worrying . Start Buspar 7.5 mgrs BID Start Vistaril 25 mg Q 6 hours PRN for severe anxiety as needed and 50 mg QHS PRN. Marland Kitchen   Patient remained on the [facility] unit for 3 days and stabilized with medication and therapy. Patient was started on Wellbutrin XL and titrated to 300 mg Daily, Buspar 7.5 mg BID, Vistaril 25 mg Q6H PRN and 50 mg QHS PRN. Ativan was discontinued due to him not wanting to be on a controlled substance. He has shown improvement with improved mood, affect, sleep, appetite, and interaction. He has been attending groups and participating. He was seen in the day room interacting with the staff and peers appropriately. Patient agrees to follow up at [place]. He denies any SI/HI/AVH and contracts for safety. Patient is provided with prescriptions for her medications upon discharge.    Physical Findings: AIMS: Facial and Oral Movements Muscles of Facial Expression: None, normal Lips and Perioral Area: None, normal Jaw: None, normal Tongue: None, normal,Extremity Movements Upper (arms, wrists, hands, fingers): None, normal Lower (legs, knees, ankles, toes): None, normal, Trunk Movements Neck, shoulders, hips: None, normal, Overall Severity Severity of abnormal movements (highest score from questions above): None, normal Incapacitation due to abnormal movements: None, normal Patient's awareness of abnormal movements (rate only patient's report): No Awareness, Dental Status Current problems with teeth and/or dentures?: No Does patient usually wear dentures?: No  CIWA:    COWS:     Musculoskeletal: Strength & Muscle Tone: within normal limits Gait & Station: normal Patient leans: N/A  Psychiatric Specialty Exam: Physical Exam  Nursing note and vitals reviewed. Constitutional: He is  oriented to person, place, and time. He appears well-developed and well-nourished.  Cardiovascular: Normal rate.  Respiratory: Effort normal.  Musculoskeletal: Normal range of motion.  Neurological: He is alert and oriented to person, place, and time.  Skin: Skin is warm.    Review of Systems  Constitutional: Negative.   HENT: Negative.   Eyes: Negative.   Respiratory: Negative.   Cardiovascular: Negative.   Gastrointestinal: Negative.   Genitourinary: Negative.   Musculoskeletal: Negative.   Skin: Negative.   Neurological: Negative.   Endo/Heme/Allergies: Negative.   Psychiatric/Behavioral: Negative.     Blood pressure 124/66, pulse 82, temperature 98.1 F (36.7 C), temperature source Oral, resp. rate 16, height 5\' 11"  (1.803 m), weight (!) 151 kg (333 lb).Body mass index is 46.44 kg/m.  General Appearance: Casual  Eye Contact:  Good  Speech:  Clear and Coherent and Normal Rate  Volume:  Normal  Mood:  Euthymic  Affect:  Appropriate  Thought Process:  Goal Directed and Descriptions of Associations: Intact  Orientation:  Full (Time, Place, and Person)  Thought Content:  WDL  Suicidal Thoughts:  No  Homicidal Thoughts:  No  Memory:  Immediate;   Good Recent;  Good Remote;   Good  Judgement:  Good  Insight:  Good  Psychomotor Activity:  Normal  Concentration:  Concentration: Good and Attention Span: Good  Recall:  Good  Fund of Knowledge:  Good  Language:  Good  Akathisia:  No  Handed:  Right  AIMS (if indicated):     Assets:  Communication Skills Desire for Improvement Financial Resources/Insurance Housing Physical Health Social Support Transportation  ADL's:  Intact  Cognition:  WNL  Sleep:  Number of Hours: 6.5     Have you used any form of tobacco in the last 30 days? (Cigarettes, Smokeless Tobacco, Cigars, and/or Pipes): No  Has this patient used any form of tobacco in the last 30 days? (Cigarettes, Smokeless Tobacco, Cigars, and/or Pipes) Yes,  No  Blood Alcohol level:  Lab Results  Component Value Date   ETH <10 51/11/5850    Metabolic Disorder Labs:  No results found for: HGBA1C, MPG No results found for: PROLACTIN No results found for: CHOL, TRIG, HDL, CHOLHDL, VLDL, LDLCALC  See Psychiatric Specialty Exam and Suicide Risk Assessment completed by Attending Physician prior to discharge.  Discharge destination:  Home  Is patient on multiple antipsychotic therapies at discharge:  No   Has Patient had three or more failed trials of antipsychotic monotherapy by history:  No  Recommended Plan for Multiple Antipsychotic Therapies: NA   Allergies as of 12/16/2017   No Known Allergies     Medication List    STOP taking these medications   albuterol 108 (90 Base) MCG/ACT inhaler Commonly known as:  PROVENTIL HFA;VENTOLIN HFA   ALPRAZolam 0.25 MG tablet Commonly known as:  XANAX   cetirizine 10 MG tablet Commonly known as:  ZYRTEC   naproxen sodium 220 MG tablet Commonly known as:  ALEVE   Vitamin D-3 5000 units Tabs     TAKE these medications     Indication  buPROPion 300 MG 24 hr tablet Commonly known as:  WELLBUTRIN XL Take 1 tablet (300 mg total) by mouth daily. For mood control Start taking on:  12/17/2017 What changed:  additional instructions  Indication:  Major Depressive Disorder   busPIRone 7.5 MG tablet Commonly known as:  BUSPAR Take 1 tablet (7.5 mg total) by mouth 2 (two) times daily. For anxiety  Indication:  Anxiety Disorder   hydrOXYzine 25 MG tablet Commonly known as:  ATARAX/VISTARIL Take 1 tablet (25 mg total) by mouth 3 (three) times daily as needed for anxiety.  Indication:  Feeling Anxious   hydrOXYzine 50 MG tablet Commonly known as:  ATARAX/VISTARIL Take 1 tablet (50 mg total) by mouth at bedtime. For sleep  Indication:  sleep   traZODone 50 MG tablet Commonly known as:  DESYREL Take 1 tablet (50 mg total) by mouth at bedtime as needed for sleep.  Indication:  [facility]. Go on 12/24/2017.   Why:  Please attend your medication appt with Dr. Toy Cookey on Friday, 12/24/16, 12:30pm Contact information: [address] [place] [address] [phone]        Employee Assistance Counseling Program. Go on 12/24/2017.   Why:  Please attend your therapy appt with Leamon Arnt on Friday, 12/24/17, at 9:00am. Contact information: [address], [place] [address] P: [phone] F: [phone]          Follow-up recommendations:  Continue activity as tolerated. Continue diet as recommended by your PCP. Ensure  to keep all appointments with outpatient providers.  Comments:  Patient is instructed prior to discharge to: Take all medications as prescribed by his/her mental healthcare provider. Report any adverse effects and or reactions from the medicines to his/her outpatient provider promptly. Patient has been instructed & cautioned: To not engage in alcohol and or illegal drug use while on prescription medicines. In the event of worsening symptoms, patient is instructed to call the crisis hotline, 911 and or go to the nearest ED for appropriate evaluation and treatment of symptoms. To follow-up with his/her primary care provider for your other medical issues, concerns and or health care needs.    Signed: [place], FNP 12/16/2017, 11:42 AM   Patient seen, Suicide Assessment Completed.  Disposition Plan Reviewed

## 2017-12-16 NOTE — Progress Notes (Signed)
  [facility] Adult Case Management Discharge Plan :  Will you be returning to the same living situation after discharge:  Yes,  with wife At discharge, do you have transportation home?: Yes,  wife Do you have the ability to pay for your medications: Yes,  [facility]  Release of information consent forms completed and in the chart;  Patient's signature needed at discharge.  Patient to Follow up at: [facility]. Go on 12/24/2017.   Why:  Please attend your medication appt with Dr. Toy Cookey on Friday, 12/24/16, 12:30pm Contact information: [address] [place] [address] [phone]        Employee Assistance Counseling Program. Go on 12/24/2017.   Why:  Please attend your therapy appt with Leamon Arnt on Friday, 12/24/17, at 9:00am. Contact information: [address] [place], [place] [address] P: [phone] F: [phone]          Next level of care provider has access to [facility] Link:no-Neuropsychiatric care, Yes: [facility]  Safety Planning and Suicide Prevention discussed: Yes,  with wife  Have you used any form of tobacco in the last 30 days? (Cigarettes, Smokeless Tobacco, Cigars, and/or Pipes): No  Has patient been referred to the Quitline?: N/A patient is not a smoker  Patient has been referred for addiction treatment: Yes  Joanne Chars, LCSW 12/16/2017, 10:39 AM

## 2017-12-16 NOTE — Progress Notes (Signed)
Pt received both written and verbal discharge instructions. Pt verbalized understanding of discharge instructions. Pt agreed to f/u appt and med regimen. Pt received d/c packet and prescriptions. Pt gathered belongings from room and locker. Pt safely discharged to the lobby. 

## 2017-12-24 DIAGNOSIS — F4322 Adjustment disorder with anxiety: Secondary | ICD-10-CM | POA: Diagnosis not present

## 2017-12-24 DIAGNOSIS — F332 Major depressive disorder, recurrent severe without psychotic features: Secondary | ICD-10-CM | POA: Diagnosis not present

## 2018-01-06 MED FILL — busPIRone HCL 10 MG TABS: 10 | 30 days supply | Qty: 60 | Fill #0

## 2018-01-19 DIAGNOSIS — F4322 Adjustment disorder with anxiety: Secondary | ICD-10-CM | POA: Diagnosis not present

## 2018-01-19 DIAGNOSIS — F332 Major depressive disorder, recurrent severe without psychotic features: Secondary | ICD-10-CM | POA: Diagnosis not present

## 2018-02-07 MED FILL — busPIRone HCL 10 MG TABS: 10 | 30 days supply | Qty: 60 | Fill #0 | Status: TO

## 2018-02-10 DIAGNOSIS — F419 Anxiety disorder, unspecified: Secondary | ICD-10-CM | POA: Diagnosis not present

## 2018-02-10 DIAGNOSIS — L659 Nonscarring hair loss, unspecified: Secondary | ICD-10-CM | POA: Diagnosis not present

## 2018-02-10 DIAGNOSIS — R748 Abnormal levels of other serum enzymes: Secondary | ICD-10-CM | POA: Diagnosis not present

## 2018-02-10 DIAGNOSIS — F321 Major depressive disorder, single episode, moderate: Secondary | ICD-10-CM | POA: Diagnosis not present

## 2018-02-10 DIAGNOSIS — R002 Palpitations: Secondary | ICD-10-CM | POA: Diagnosis not present

## 2018-03-01 MED FILL — BUPROPION HCL XL 300 MG TAB: 300 | 90 days supply | Qty: 90 | Fill #1

## 2018-03-16 MED FILL — busPIRone HCL 10 MG TABS: 10 | 30 days supply | Qty: 60 | Fill #0

## 2018-04-01 DIAGNOSIS — F332 Major depressive disorder, recurrent severe without psychotic features: Secondary | ICD-10-CM | POA: Diagnosis not present

## 2018-04-01 DIAGNOSIS — F4322 Adjustment disorder with anxiety: Secondary | ICD-10-CM | POA: Diagnosis not present

## 2018-04-27 MED FILL — hydrOXYzine HCL 25 MG TABS: 25 | 30 days supply | Qty: 90 | Fill #0

## 2018-04-27 MED FILL — busPIRone HCL 10 MG TABS: 10 | 30 days supply | Qty: 60 | Fill #0

## 2018-05-25 MED FILL — BUPROPION HCL XL 300 MG TAB: 300 | 30 days supply | Qty: 30 | Fill #0

## 2018-06-06 MED FILL — busPIRone HCL 10 MG TABS: 10 | 30 days supply | Qty: 60 | Fill #1

## 2018-06-07 ENCOUNTER — Encounter: Payer: Self-pay | Admitting: Family Medicine

## 2018-06-07 ENCOUNTER — Ambulatory Visit (INDEPENDENT_AMBULATORY_CARE_PROVIDER_SITE_OTHER): Payer: 59 | Admitting: Family Medicine

## 2018-06-07 VITALS — BP 120/80 | HR 80 | Ht 71.0 in | Wt 303.0 lb

## 2018-06-07 DIAGNOSIS — E349 Endocrine disorder, unspecified: Secondary | ICD-10-CM | POA: Diagnosis not present

## 2018-06-07 DIAGNOSIS — Z7689 Persons encountering health services in other specified circumstances: Secondary | ICD-10-CM | POA: Diagnosis not present

## 2018-06-07 DIAGNOSIS — F329 Major depressive disorder, single episode, unspecified: Secondary | ICD-10-CM | POA: Diagnosis not present

## 2018-06-07 NOTE — Progress Notes (Signed)
Name: Alex [name]   MRN: [id number]    DOB: 01-25-1981   Date:06/07/2018       Progress Note  Subjective  Chief Complaint  Chief Complaint  Patient presents with  . Establish Care    consultation for transitioning.     Patient is a 37 year old *male* who presents for a medical /hormonal concern. The patient reports the following problems:. Health maintenance has been reviewed upto date.   No problem-specific Assessment & Plan notes found for this encounter.   Past Medical History:  Diagnosis Date  . Anxiety   . Asthma    exercise-induced  . Depression     History reviewed. No pertinent surgical history.  Family History  Problem Relation Age of Onset  . Hypertension Father   . Cancer Maternal Grandfather   . Cancer Paternal Grandfather     Social History   Socioeconomic History  . Marital status: Married    Spouse name: Not on file  . Number of children: Not on file  . Years of education: Not on file  . Highest education level: Not on file  Occupational History  . Not on file  Social Needs  . Financial resource strain: Not on file  . Food insecurity:    Worry: Not on file    Inability: Not on file  . Transportation needs:    Medical: Not on file    Non-medical: Not on file  Tobacco Use  . Smoking status: Never Smoker  . Smokeless tobacco: Never Used  Substance and Sexual Activity  . Alcohol use: Yes    Alcohol/week: 2.0 standard drinks    Types: 2 Standard drinks or equivalent per week    Comment: occasional  . Drug use: No  . Sexual activity: Yes  Lifestyle  . Physical activity:    Days per week: Not on file    Minutes per session: Not on file  . Stress: Not on file  Relationships  . Social connections:    Talks on phone: Not on file    Gets together: Not on file    Attends religious service: Not on file    Active member of club or organization: Not on file    Attends meetings of clubs or organizations: Not on file    Relationship status:  Not on file  . Intimate partner violence:    Fear of current or ex partner: Not on file    Emotionally abused: Not on file    Physically abused: Not on file    Forced sexual activity: Not on file  Other Topics Concern  . Not on file  Social History Narrative   Drinks 3 cups of caffeine daily.    No Known Allergies  Outpatient Medications Prior to Visit  Medication Sig Dispense Refill  . buPROPion (WELLBUTRIN XL) 300 MG 24 hr tablet Take 1 tablet (300 mg total) by mouth daily. For mood control 30 tablet 0  . busPIRone (BUSPAR) 7.5 MG tablet Take 1 tablet (7.5 mg total) by mouth 2 (two) times daily. For anxiety 60 tablet 0  . hydrOXYzine (ATARAX/VISTARIL) 25 MG tablet Take 1 tablet (25 mg total) by mouth 3 (three) times daily as needed for anxiety. 60 tablet 0  . traZODone (DESYREL) 50 MG tablet Take 1 tablet (50 mg total) by mouth at bedtime as needed for sleep. 30 tablet 0  . hydrOXYzine (ATARAX/VISTARIL) 50 MG tablet Take 1 tablet (50 mg total) by mouth at bedtime. For sleep 30  tablet 0   No facility-administered medications prior to visit.     Review of Systems  Constitutional: Negative for chills, fever, malaise/fatigue and weight loss.  HENT: Negative for ear discharge, ear pain and sore throat.   Eyes: Negative for blurred vision.  Respiratory: Negative for cough, sputum production, shortness of breath and wheezing.   Cardiovascular: Negative for chest pain, palpitations and leg swelling.  Gastrointestinal: Negative for abdominal pain, blood in stool, constipation, diarrhea, heartburn, melena and nausea.  Genitourinary: Negative for dysuria, frequency, hematuria and urgency.  Musculoskeletal: Negative for back pain, joint pain, myalgias and neck pain.  Skin: Negative for rash.  Neurological: Negative for dizziness, tingling, sensory change, focal weakness and headaches.  Endo/Heme/Allergies: Negative for environmental allergies and polydipsia. Does not bruise/bleed easily.   Psychiatric/Behavioral: Positive for depression. Negative for hallucinations, memory loss, substance abuse and suicidal ideas. The patient has insomnia. The patient is not nervous/anxious.      Objective  Vitals:   06/07/18 1414  BP: 120/80  Pulse: 80  Weight: (!) 303 lb (137.4 kg)  Height: 5\' 11"  (1.803 m)    Physical Exam  Constitutional: He is oriented to person, place, and time.  HENT:  Head: Normocephalic.  Right Ear: External ear normal.  Left Ear: External ear normal.  Nose: Nose normal.  Mouth/Throat: Oropharynx is clear and moist.  Eyes: Pupils are equal, round, and reactive to light. Conjunctivae and EOM are normal. Right eye exhibits no discharge. Left eye exhibits no discharge. No scleral icterus.  Neck: Normal range of motion. Neck supple. No JVD present. No tracheal deviation present. No thyromegaly present.  Cardiovascular: Normal rate, regular rhythm, normal heart sounds and intact distal pulses. Exam reveals no gallop and no friction rub.  No murmur heard. Pulmonary/Chest: Breath sounds normal. No respiratory distress. He has no wheezes. He has no rales.  Abdominal: Soft. Bowel sounds are normal. He exhibits no mass. There is no hepatosplenomegaly. There is no tenderness. There is no rebound, no guarding and no CVA tenderness.  Musculoskeletal: Normal range of motion. He exhibits no edema or tenderness.  Lymphadenopathy:    He has no cervical adenopathy.  Neurological: He is alert and oriented to person, place, and time. He has normal strength and normal reflexes. No cranial nerve deficit.  Skin: Skin is warm. No rash noted.  Nursing note and vitals reviewed.     Assessment & Plan  Problem List Items Addressed This Visit    None    Visit Diagnoses    Encounter to establish care with new doctor    -  Primary   Patient to establish for primary care and possible hormone disorder management   Endocrine disorder       Information given for M2F / discuss  pych concerns / will return with awarenss statement from therapist/ and consent form to be discussed and signed next visit   Reactive depression       with aspect of dysphoria/ will follow with therapist/psychiatrist.      No orders of the defined types were placed in this encounter.     Dr. Macon [facility]  06/07/18

## 2018-07-01 MED FILL — BuPROPion HCL ER (XL) 300 M: 300 | 30 days supply | Qty: 30 | Fill #0

## 2018-07-13 DIAGNOSIS — F4322 Adjustment disorder with anxiety: Secondary | ICD-10-CM | POA: Diagnosis not present

## 2018-07-13 DIAGNOSIS — F332 Major depressive disorder, recurrent severe without psychotic features: Secondary | ICD-10-CM | POA: Diagnosis not present

## 2018-07-14 ENCOUNTER — Encounter: Payer: Self-pay | Admitting: Family Medicine

## 2018-07-26 ENCOUNTER — Ambulatory Visit (INDEPENDENT_AMBULATORY_CARE_PROVIDER_SITE_OTHER): Payer: 59 | Admitting: Family Medicine

## 2018-07-26 ENCOUNTER — Encounter: Payer: Self-pay | Admitting: Family Medicine

## 2018-07-26 VITALS — BP 100/70 | HR 82 | Ht 71.0 in | Wt 285.0 lb

## 2018-07-26 DIAGNOSIS — Z79890 Hormone replacement therapy (postmenopausal): Secondary | ICD-10-CM

## 2018-07-26 DIAGNOSIS — E349 Endocrine disorder, unspecified: Secondary | ICD-10-CM | POA: Diagnosis not present

## 2018-07-26 MED ORDER — SPIRONOLACTONE 25 MG PO TABS: 25.0000 mg | ORAL_TABLET | Freq: Two times a day (BID) | ORAL | 1 refills | Status: DC

## 2018-07-26 MED ORDER — ESTRADIOL 2 MG PO TABS: 2.0000 mg | ORAL_TABLET | Freq: Two times a day (BID) | ORAL | 1 refills | Status: DC

## 2018-07-26 NOTE — Progress Notes (Signed)
Date:  07/26/2018   Name:  Alex [name]   DOB:  [birth year]   MRN:  [id number]   Chief Complaint: Follow-up (concern for "being on a diuretic due to being in OR" ) Patient is a 37 year old M2F who presents for a HRT physical exam. The patient reports the following problems: diuretic concern. Health maintenance has been reviewed UTD    Review of Systems  Constitutional: Negative for appetite change, chills, fatigue, fever and unexpected weight change.  HENT: Negative for drooling, ear discharge, ear pain, facial swelling, hearing loss, nosebleeds, sneezing, sore throat and trouble swallowing.   Eyes: Negative for photophobia, pain, discharge, redness, itching and visual disturbance.  Respiratory: Negative for cough, choking, chest tightness, shortness of breath and wheezing.   Cardiovascular: Negative for chest pain, palpitations and leg swelling.  Gastrointestinal: Negative for abdominal pain, blood in stool, constipation, diarrhea, nausea, rectal pain and vomiting.  Endocrine: Negative for cold intolerance, heat intolerance, polydipsia, polyphagia and polyuria.  Genitourinary: Negative for decreased urine volume, difficulty urinating, discharge, dysuria, flank pain, frequency, hematuria, penile pain, penile swelling, scrotal swelling, testicular pain and urgency.  Musculoskeletal: Negative for back pain, joint swelling, myalgias, neck pain and neck stiffness.  Skin: Negative for color change and rash.  Allergic/Immunologic: Negative for environmental allergies and immunocompromised state.  Neurological: Negative for dizziness, tremors, seizures, syncope, speech difficulty, weakness, light-headedness, numbness and headaches.  Hematological: Does not bruise/bleed easily.  Psychiatric/Behavioral: Negative for agitation, behavioral problems, confusion, dysphoric mood, hallucinations, self-injury and suicidal ideas. The patient is not nervous/anxious.     Patient Active Problem List   Diagnosis Date Noted  . MDD (major depressive disorder), single episode, severe , no psychosis ([place])   . MDD (major depressive disorder), recurrent severe, without psychosis ([place]) 12/13/2017  . Morbid obesity due to excess calories ([place]) 03/04/2016  . Essential hypertension 03/04/2016  . Hypersomnia with sleep apnea 01/14/2016  . Snoring 01/14/2016    No Known Allergies  No past surgical history on file.  Social History   Tobacco Use  . Smoking status: Never Smoker  . Smokeless tobacco: Never Used  Substance Use Topics  . Alcohol use: Yes    Alcohol/week: 2.0 standard drinks    Types: 2 Standard drinks or equivalent per week    Comment: occasional  . Drug use: No     Medication list has been reviewed and updated.  Current Meds  Medication Sig  . buPROPion (WELLBUTRIN XL) 300 MG 24 hr tablet Take 1 tablet (300 mg total) by mouth daily. For mood control  . busPIRone (BUSPAR) 7.5 MG tablet Take 1 tablet (7.5 mg total) by mouth 2 (two) times daily. For anxiety  . hydrOXYzine (ATARAX/VISTARIL) 25 MG tablet Take 1 tablet (25 mg total) by mouth 3 (three) times daily as needed for anxiety.  . traZODone (DESYREL) 50 MG tablet Take 1 tablet (50 mg total) by mouth at bedtime as needed for sleep.    PHQ 2/9 Scores 06/07/2018  PHQ - 2 Score 1  PHQ- 9 Score 6    Physical Exam  Constitutional: He is oriented to person, place, and time.  HENT:  Head: Normocephalic.  Right Ear: External ear normal.  Left Ear: External ear normal.  Nose: Nose normal.  Mouth/Throat: Oropharynx is clear and moist.  Eyes: Pupils are equal, round, and reactive to light. Conjunctivae and EOM are normal. Right eye exhibits no discharge. Left eye exhibits no discharge. No scleral icterus.  Neck: Normal range of  motion. Neck supple. No JVD present. No tracheal deviation present. No thyromegaly present.  Cardiovascular: Normal rate, regular rhythm, normal heart sounds and intact distal pulses. Exam reveals no  gallop and no friction rub.  No murmur heard. Pulmonary/Chest: Breath sounds normal. No respiratory distress. He has no wheezes. He has no rales.  Abdominal: Soft. Bowel sounds are normal. He exhibits no mass. There is no hepatosplenomegaly. There is no tenderness. There is no rebound, no guarding and no CVA tenderness.  Musculoskeletal: Normal range of motion. He exhibits no edema or tenderness.  Lymphadenopathy:    He has no cervical adenopathy.  Neurological: He is alert and oriented to person, place, and time. He has normal strength and normal reflexes. No cranial nerve deficit.  Skin: Skin is warm. No rash noted.  Nursing note and vitals reviewed.   BP 100/70   Pulse 82   Ht 5\' 11"  (1.803 m)   Wt 285 lb (129.3 kg)   BMI 39.75 kg/m   Assessment and Plan:  1. Endocrine disorder Gender affirmation hormone therapy. Will initiate estradiol/spironolactone at low dose. Recheck in 6 weeks - estradiol (ESTRACE) 2 MG tablet; Take 1 tablet (2 mg total) by mouth 2 (two) times daily.  Dispense: 60 tablet; Refill: 1 - spironolactone (ALDACTONE) 25 MG tablet; Take 1 tablet (25 mg total) by mouth 2 (two) times daily.  Dispense: 60 tablet; Refill: 1  2. Hormone replacement therapy (HRT) Consent obtained. Medication started after discuss risk/effects - estradiol (ESTRACE) 2 MG tablet; Take 1 tablet (2 mg total) by mouth 2 (two) times daily.  Dispense: 60 tablet; Refill: 1 - spironolactone (ALDACTONE) 25 MG tablet; Take 1 tablet (25 mg total) by mouth 2 (two) times daily.  Dispense: 60 tablet; Refill: 1   Dr. Mac[facility]  07/26/2018

## 2018-08-03 MED FILL — BuPROPion HCL ER (XL) 300 M: 300 | 30 days supply | Qty: 30 | Fill #1

## 2018-08-26 MED FILL — busPIRone HCL 10 MG TABS: 10 | 30 days supply | Qty: 60 | Fill #0

## 2018-08-31 MED FILL — SPIRONOLACTONE 25 MG TABLET: 25 | 30 days supply | Qty: 60 | Fill #0

## 2018-08-31 MED FILL — ESTRADIOL 2 MG TABLET: 2 | 90 days supply | Qty: 90 | Fill #0

## 2018-09-02 MED FILL — BuPROPion HCL ER (XL) 300 M: 300 | 30 days supply | Qty: 30 | Fill #2

## 2018-09-06 ENCOUNTER — Ambulatory Visit (INDEPENDENT_AMBULATORY_CARE_PROVIDER_SITE_OTHER): Payer: 59 | Admitting: Family Medicine

## 2018-09-06 ENCOUNTER — Encounter: Payer: Self-pay | Admitting: Family Medicine

## 2018-09-06 VITALS — BP 102/70 | HR 72 | Ht 71.0 in | Wt 283.0 lb

## 2018-09-06 DIAGNOSIS — Z79890 Hormone replacement therapy (postmenopausal): Secondary | ICD-10-CM

## 2018-09-06 DIAGNOSIS — E349 Endocrine disorder, unspecified: Secondary | ICD-10-CM | POA: Diagnosis not present

## 2018-09-06 MED ORDER — ESTRADIOL 2 MG PO TABS: 2.0000 mg | ORAL_TABLET | Freq: Two times a day (BID) | ORAL | 1 refills | Status: DC

## 2018-09-06 MED ORDER — SPIRONOLACTONE 25 MG PO TABS: 25.0000 mg | ORAL_TABLET | Freq: Two times a day (BID) | ORAL | 1 refills | Status: DC

## 2018-09-06 NOTE — Progress Notes (Signed)
Date:  09/06/2018   Name:  Alex [name]   DOB:  10/20/1981   MRN:  [id number]   Chief Complaint: Follow-up (hormone replacement therapy) Patient is a 37 year old ze who presents for a comprehensive physical exam. The patient reports the following problems: none. Health maintenance has been reviewed up to date.    Review of Systems  Constitutional: Negative for appetite change, chills, fatigue, fever and unexpected weight change.  HENT: Negative for ear pain, facial swelling, hearing loss, nosebleeds, sneezing, sore throat and trouble swallowing.   Eyes: Negative for photophobia, pain, discharge, redness, itching and visual disturbance.  Respiratory: Negative for cough, choking, chest tightness, shortness of breath and wheezing.   Cardiovascular: Negative for chest pain, palpitations and leg swelling.  Gastrointestinal: Negative for abdominal pain, blood in stool, constipation, diarrhea, nausea, rectal pain and vomiting.  Endocrine: Negative for cold intolerance, heat intolerance, polydipsia, polyphagia and polyuria.  Genitourinary: Negative for decreased urine volume, difficulty urinating, discharge, dysuria, flank pain, frequency, hematuria, penile pain, penile swelling, scrotal swelling, testicular pain and urgency.  Musculoskeletal: Negative for back pain, joint swelling, neck pain and neck stiffness.  Skin: Negative for color change and rash.  Allergic/Immunologic: Negative for immunocompromised state.  Neurological: Negative for dizziness, tremors, seizures, syncope, speech difficulty, weakness, light-headedness, numbness and headaches.  Hematological: Does not bruise/bleed easily.  Psychiatric/Behavioral: Negative for agitation, behavioral problems, confusion, dysphoric mood, hallucinations, self-injury and suicidal ideas. The patient is not nervous/anxious.     Patient Active Problem List   Diagnosis Date Noted  . MDD (major depressive disorder), single episode, severe ,  no psychosis ([place])   . MDD (major depressive disorder), recurrent severe, without psychosis ([place]) 12/13/2017  . Morbid obesity due to excess calories ([place]) 03/04/2016  . Essential hypertension 03/04/2016  . Hypersomnia with sleep apnea 01/14/2016  . Snoring 01/14/2016    No Known Allergies  No past surgical history on file.  Social History   Tobacco Use  . Smoking status: Never Smoker  . Smokeless tobacco: Never Used  Substance Use Topics  . Alcohol use: Yes    Alcohol/week: 2.0 standard drinks    Types: 2 Standard drinks or equivalent per week    Comment: occasional  . Drug use: No     Medication list has been reviewed and updated.  Current Meds  Medication Sig  . buPROPion (WELLBUTRIN XL) 300 MG 24 hr tablet Take 1 tablet (300 mg total) by mouth daily. For mood control  . busPIRone (BUSPAR) 7.5 MG tablet Take 1 tablet (7.5 mg total) by mouth 2 (two) times daily. For anxiety  . estradiol (ESTRACE) 2 MG tablet Take 1 tablet (2 mg total) by mouth 2 (two) times daily.  . hydrOXYzine (ATARAX/VISTARIL) 25 MG tablet Take 1 tablet (25 mg total) by mouth 3 (three) times daily as needed for anxiety.  Marland Kitchen spironolactone (ALDACTONE) 25 MG tablet Take 1 tablet (25 mg total) by mouth 2 (two) times daily.  . traZODone (DESYREL) 50 MG tablet Take 1 tablet (50 mg total) by mouth at bedtime as needed for sleep.    PHQ 2/9 Scores 09/06/2018 06/07/2018  PHQ - 2 Score 1 1  PHQ- 9 Score 2 6    Physical Exam  Constitutional: He is oriented to person, place, and time.  HENT:  Head: Normocephalic.  Right Ear: External ear normal.  Left Ear: External ear normal.  Nose: Nose normal.  Mouth/Throat: Oropharynx is clear and moist.  Eyes: Pupils are equal, round,  and reactive to light. Conjunctivae and EOM are normal. Right eye exhibits no discharge. Left eye exhibits no discharge. No scleral icterus.  Neck: Normal range of motion. Neck supple. No JVD present. No tracheal deviation present. No  thyromegaly present.  Cardiovascular: Normal rate, regular rhythm, normal heart sounds and intact distal pulses. Exam reveals no gallop and no friction rub.  No murmur heard. Pulmonary/Chest: Breath sounds normal. No respiratory distress. He has no wheezes. He has no rales.  Abdominal: Soft. Bowel sounds are normal. He exhibits no mass. There is no hepatosplenomegaly. There is no tenderness. There is no rebound, no guarding and no CVA tenderness.  Musculoskeletal: Normal range of motion. He exhibits no edema or tenderness.  Lymphadenopathy:    He has no cervical adenopathy.  Neurological: He is alert and oriented to person, place, and time. He has normal strength and normal reflexes. No cranial nerve deficit.  Skin: Skin is warm. No rash noted.  Nursing note and vitals reviewed.   BP 102/70   Pulse 72   Ht 5\' 11"  (1.803 m)   Wt 283 lb (128.4 kg)   BMI 39.47 kg/m   Assessment and Plan:  1. Endocrine disorder Gender affirmation hormone treatment. Continue at present dosing of level of transition. - spironolactone (ALDACTONE) 25 MG tablet; Take 1 tablet (25 mg total) by mouth 2 (two) times daily.  Dispense: 180 tablet; Refill: 1 - estradiol (ESTRACE) 2 MG tablet; Take 1 tablet (2 mg total) by mouth 2 (two) times daily.  Dispense: 180 tablet; Refill: 1  2. Hormone replacement therapy (HRT) Replacement of estradiol with suppression of testosterone - spironolactone (ALDACTONE) 25 MG tablet; Take 1 tablet (25 mg total) by mouth 2 (two) times daily.  Dispense: 180 tablet; Refill: 1 - estradiol (ESTRACE) 2 MG tablet; Take 1 tablet (2 mg total) by mouth 2 (two) times daily.  Dispense: 180 tablet; Refill: 1   Dr. [facility]  09/06/2018

## 2018-09-27 MED FILL — busPIRone HCL 10 MG TABS: 10 | 30 days supply | Qty: 60 | Fill #1

## 2018-10-03 MED FILL — SPIRONOLACTONE 25 MG TABLET: 25 | 90 days supply | Qty: 180 | Fill #0

## 2018-10-06 MED FILL — ESTRADIOL 2 MG TABLET: 2 | 90 days supply | Qty: 180 | Fill #0

## 2018-10-10 MED FILL — buPROPion HCL ER (XL) 300 M: 300 | 30 days supply | Qty: 30 | Fill #0

## 2018-10-11 DIAGNOSIS — F4322 Adjustment disorder with anxiety: Secondary | ICD-10-CM | POA: Diagnosis not present

## 2018-10-11 DIAGNOSIS — F332 Major depressive disorder, recurrent severe without psychotic features: Secondary | ICD-10-CM | POA: Diagnosis not present

## 2018-10-28 MED FILL — busPIRone HCL 10 MG TABS: 10 | 30 days supply | Qty: 60 | Fill #2

## 2018-11-09 MED FILL — buPROPion HCL ER (XL) 300 M: 300 | 30 days supply | Qty: 30 | Fill #1

## 2018-12-02 MED FILL — busPIRone HCL 10 MG TABS: 10 | 30 days supply | Qty: 60 | Fill #0

## 2018-12-08 MED FILL — buPROPion HCL ER (XL) 300 M: 300 | 30 days supply | Qty: 30 | Fill #2

## 2019-01-03 MED FILL — busPIRone HCL 10 MG TABS: 10 | 30 days supply | Qty: 60 | Fill #1 | Status: TO

## 2019-01-04 DIAGNOSIS — F4322 Adjustment disorder with anxiety: Secondary | ICD-10-CM | POA: Diagnosis not present

## 2019-01-04 DIAGNOSIS — F332 Major depressive disorder, recurrent severe without psychotic features: Secondary | ICD-10-CM | POA: Diagnosis not present

## 2019-01-04 MED FILL — hydrOXYzine HCL 25 MG TABS: 25 | 30 days supply | Qty: 90 | Fill #0

## 2019-01-04 MED FILL — buPROPion HCL ER (XL) 300 M: 300 | 30 days supply | Qty: 30 | Fill #0 | Status: TO

## 2019-01-09 MED FILL — SPIRONOLACTONE 25 MG TABLET: 25 | 90 days supply | Qty: 180 | Fill #1

## 2019-01-26 ENCOUNTER — Encounter: Payer: Self-pay | Admitting: Family Medicine

## 2019-02-07 MED FILL — buPROPion HCL ER (XL) 300 M: 300 | 30 days supply | Qty: 30 | Fill #0

## 2019-02-07 MED FILL — busPIRone HCL 10 MG TABS: 10 | 30 days supply | Qty: 60 | Fill #0

## 2019-03-03 MED FILL — buPROPion HCL ER (XL) 300 M: 300 | 30 days supply | Qty: 30 | Fill #1

## 2019-03-03 MED FILL — busPIRone HCL 10 MG TABS: 10 | 30 days supply | Qty: 60 | Fill #0

## 2019-03-07 ENCOUNTER — Other Ambulatory Visit: Payer: Self-pay

## 2019-03-07 ENCOUNTER — Ambulatory Visit (INDEPENDENT_AMBULATORY_CARE_PROVIDER_SITE_OTHER): Payer: 59 | Admitting: Family Medicine

## 2019-03-07 ENCOUNTER — Encounter: Payer: Self-pay | Admitting: Family Medicine

## 2019-03-07 VITALS — BP 100/60 | HR 72 | Ht 71.0 in | Wt 290.0 lb

## 2019-03-07 DIAGNOSIS — Z0000 Encounter for general adult medical examination without abnormal findings: Secondary | ICD-10-CM | POA: Diagnosis not present

## 2019-03-07 DIAGNOSIS — F332 Major depressive disorder, recurrent severe without psychotic features: Secondary | ICD-10-CM

## 2019-03-07 DIAGNOSIS — Z1211 Encounter for screening for malignant neoplasm of colon: Secondary | ICD-10-CM | POA: Diagnosis not present

## 2019-03-07 LAB — HEMOCCULT GUIAC POC 1CARD (OFFICE): Fecal Occult Blood, POC: NEGATIVE

## 2019-03-07 NOTE — Patient Instructions (Signed)

## 2019-03-07 NOTE — Addendum Note (Signed)
Addended by: Fredderick Severance on: 03/07/2019 04:33 PM   Modules accepted: Orders

## 2019-03-07 NOTE — Progress Notes (Signed)
Date:  03/07/2019   Name:  Alex [name]   DOB:  May 15, 1981   MRN:  [id number]   Chief Complaint: Annual Exam (no concerns/ needed for insurance)  Patient is a 38 year old male who presents for a comprehensive physical exam. The patient reports the following problems: none. Health maintenance has been reviewed up to date.   Review of Systems  Constitutional: Negative for chills and fever.  HENT: Negative for drooling, ear discharge, ear pain, mouth sores, nosebleeds, postnasal drip, rhinorrhea, sore throat and trouble swallowing.   Respiratory: Negative for cough, shortness of breath and wheezing.   Cardiovascular: Negative for chest pain, palpitations and leg swelling.  Gastrointestinal: Negative for abdominal pain, blood in stool, constipation, diarrhea and nausea.  Endocrine: Negative for polydipsia.  Genitourinary: Negative for dysuria, frequency, hematuria and urgency.  Musculoskeletal: Negative for back pain, myalgias and neck pain.  Skin: Negative for rash.  Allergic/Immunologic: Negative for environmental allergies.  Neurological: Negative for dizziness and headaches.  Hematological: Does not bruise/bleed easily.  Psychiatric/Behavioral: Negative for suicidal ideas. The patient is not nervous/anxious.     Patient Active Problem List   Diagnosis Date Noted  . MDD (major depressive disorder), single episode, severe , no psychosis ([place])   . MDD (major depressive disorder), recurrent severe, without psychosis ([place]) 12/13/2017  . Morbid obesity due to excess calories ([place]) 03/04/2016  . Essential hypertension 03/04/2016  . Hypersomnia with sleep apnea 01/14/2016  . Snoring 01/14/2016    No Known Allergies  No past surgical history on file.  Social History   Tobacco Use  . Smoking status: Never Smoker  . Smokeless tobacco: Never Used  Substance Use Topics  . Alcohol use: Yes    Alcohol/week: 2.0 standard drinks    Types: 2 Standard drinks or equivalent per week     Comment: occasional  . Drug use: No     Medication list has been reviewed and updated.  Current Meds  Medication Sig  . buPROPion (WELLBUTRIN XL) 300 MG 24 hr tablet Take 1 tablet (300 mg total) by mouth daily. For mood control  . busPIRone (BUSPAR) 7.5 MG tablet Take 1 tablet (7.5 mg total) by mouth 2 (two) times daily. For anxiety  . hydrOXYzine (ATARAX/VISTARIL) 25 MG tablet Take 1 tablet (25 mg total) by mouth 3 (three) times daily as needed for anxiety.  . traZODone (DESYREL) 50 MG tablet Take 1 tablet (50 mg total) by mouth at bedtime as needed for sleep.    PHQ 2/9 Scores 09/06/2018 06/07/2018  PHQ - 2 Score 1 1  PHQ- 9 Score 2 6    BP Readings from Last 3 Encounters:  03/07/19 100/60  09/06/18 102/70  07/26/18 100/70    Physical Exam Vitals signs and nursing note reviewed.  Constitutional:      Appearance: Normal appearance. He is well-developed and overweight.  HENT:     Head: Normocephalic.     Jaw: There is normal jaw occlusion.     Right Ear: Hearing, tympanic membrane, ear canal and external ear normal.     Left Ear: Hearing, tympanic membrane, ear canal and external ear normal.     Nose: Nose normal. No septal deviation.     Right Turbinates: Not enlarged.     Left Turbinates: Not enlarged.     Mouth/Throat:     Palate: No mass.     Pharynx: Oropharynx is clear. Uvula midline.     Tonsils: No tonsillar exudate or tonsillar abscesses.  Eyes:     General: Lids are normal. Vision grossly intact. Gaze aligned appropriately. No scleral icterus.       Right eye: No discharge.        Left eye: No discharge.     Conjunctiva/sclera: Conjunctivae normal.     Pupils: Pupils are equal, round, and reactive to light.     Funduscopic exam:    Right eye: No AV nicking.        Left eye: No AV nicking.  Neck:     Musculoskeletal: Full passive range of motion without pain, normal range of motion and neck supple.     Thyroid: No thyroid mass, thyromegaly or thyroid  tenderness.     Vascular: No JVD.     Trachea: Trachea and phonation normal. No tracheal deviation.  Cardiovascular:     Rate and Rhythm: Normal rate and regular rhythm.  No extrasystoles are present.    Chest Wall: PMI is not displaced. No thrill.     Pulses:          Carotid pulses are 2+ on the right side and 2+ on the left side.      Radial pulses are 2+ on the right side and 2+ on the left side.       Femoral pulses are 2+ on the right side and 2+ on the left side.      Popliteal pulses are 2+ on the right side and 2+ on the left side.       Dorsalis pedis pulses are 2+ on the right side and 2+ on the left side.       Posterior tibial pulses are 2+ on the right side and 2+ on the left side.     Heart sounds: Normal heart sounds, S1 normal and S2 normal. No murmur. No systolic murmur. No diastolic murmur. No friction rub. No gallop. No S3 or S4 sounds.   Pulmonary:     Effort: No respiratory distress.     Breath sounds: Normal breath sounds. No decreased breath sounds, wheezing, rhonchi or rales.  Chest:     Breasts:        Right: Normal. No swelling, bleeding or mass.        Left: Normal. No swelling, bleeding or mass.  Abdominal:     General: Bowel sounds are normal. There is no distension.     Palpations: Abdomen is soft. There is no hepatomegaly, splenomegaly or mass.     Tenderness: There is no abdominal tenderness. There is no guarding or rebound.  Genitourinary:    Penis: Normal and circumcised.      Scrotum/Testes: Normal.        Right: Mass not present.        Left: Mass not present.     Epididymis:     Right: Normal.     Left: Normal.     Comments: Left atrophy Musculoskeletal: Normal range of motion.        General: No tenderness.     Cervical back: Normal.     Thoracic back: Normal.     Lumbar back: Normal.  Lymphadenopathy:     Head:     Right side of head: No submental or submandibular adenopathy.     Left side of head: No submental or submandibular  adenopathy.     Cervical: No cervical adenopathy.  Skin:    General: Skin is warm.     Capillary Refill: Capillary refill takes less than 2 seconds.  Findings: No rash.  Neurological:     Mental Status: He is alert and oriented to person, place, and time.     Cranial Nerves: No cranial nerve deficit.     Deep Tendon Reflexes: Reflexes are normal and symmetric.     Wt Readings from Last 3 Encounters:  03/07/19 290 lb (131.5 kg)  09/06/18 283 lb (128.4 kg)  07/26/18 285 lb (129.3 kg)    BP 100/60   Pulse 72   Ht 5\' 11"  (1.803 m)   Wt 290 lb (131.5 kg)   BMI 40.45 kg/m   Assessment and Plan:  1. Annual physical exam No subjective objective concerns noted during history physical.  Review of past exams, labs, imaging, and specialty's noted no concerns.Alex [name] is a 38 y.o. male who presents today for his Complete Annual Exam. He feels well. He reports exercising . He reports he is sleeping well. Immunizations are reviewed and recommendations provided.   Age appropriate screening tests are discussed. Counseling given for risk factor reduction interventions.  Will obtain CMP CBC and lipid panel - Comprehensive metabolic panel - CBC with Differential/Platelet - Lipid panel  2. Morbid obesity due to excess calories [facility]) Health risks of being over weight were discussed and patient was counseled on weight loss options and exercise. - Lipid panel  3. Major depressive disorder, recurrent severe without psychotic features ([place]) Patient with history of depressive disorder currently controlled on bupropion 300 mg and buspirone 7.5 for accompanied anxiety.

## 2019-03-08 LAB — CBC WITH DIFFERENTIAL/PLATELET
Basophils Absolute: 0 10*3/uL (ref 0.0–0.2)
Basos: 0 %
EOS (ABSOLUTE): 0.1 10*3/uL (ref 0.0–0.4)
Eos: 2 %
Hematocrit: 41.3 % (ref 37.5–51.0)
Hemoglobin: 13.9 g/dL (ref 13.0–17.7)
Immature Grans (Abs): 0 10*3/uL (ref 0.0–0.1)
Immature Granulocytes: 1 %
Lymphocytes Absolute: 2.2 10*3/uL (ref 0.7–3.1)
Lymphs: 37 %
MCH: 30 pg (ref 26.6–33.0)
MCHC: 33.7 g/dL (ref 31.5–35.7)
MCV: 89 fL (ref 79–97)
Monocytes Absolute: 0.5 10*3/uL (ref 0.1–0.9)
Monocytes: 8 %
Neutrophils Absolute: 3 10*3/uL (ref 1.4–7.0)
Neutrophils: 52 %
Platelets: 198 10*3/uL (ref 150–450)
RBC: 4.63 x10E6/uL (ref 4.14–5.80)
RDW: 12.6 % (ref 11.6–15.4)
WBC: 5.8 10*3/uL (ref 3.4–10.8)

## 2019-03-08 LAB — COMPREHENSIVE METABOLIC PANEL
ALT: 24 IU/L (ref 0–44)
AST: 21 IU/L (ref 0–40)
Albumin/Globulin Ratio: 2 (ref 1.2–2.2)
Albumin: 4.1 g/dL (ref 4.0–5.0)
Alkaline Phosphatase: 56 IU/L (ref 39–117)
BUN/Creatinine Ratio: 14 (ref 9–20)
BUN: 13 mg/dL (ref 6–20)
Bilirubin Total: 0.4 mg/dL (ref 0.0–1.2)
CO2: 23 mmol/L (ref 20–29)
Calcium: 9.1 mg/dL (ref 8.7–10.2)
Chloride: 104 mmol/L (ref 96–106)
Creatinine, Ser: 0.94 mg/dL (ref 0.76–1.27)
GFR calc Af Amer: 121 mL/min/{1.73_m2} (ref >59)
GFR calc non Af Amer: 105 mL/min/{1.73_m2} (ref >59)
Globulin, Total: 2.1 g/dL (ref 1.5–4.5)
Glucose: 90 mg/dL (ref 65–99)
Potassium: 4.8 mmol/L (ref 3.5–5.2)
Sodium: 141 mmol/L (ref 134–144)
Total Protein: 6.2 g/dL (ref 6.0–8.5)

## 2019-03-08 LAB — LIPID PANEL
Chol/HDL Ratio: 3.2 ratio (ref 0.0–5.0)
Cholesterol, Total: 141 mg/dL (ref 100–199)
HDL: 44 mg/dL (ref >39)
LDL Calculated: 85 mg/dL (ref 0–99)
Triglycerides: 61 mg/dL (ref 0–149)
VLDL Cholesterol Cal: 12 mg/dL (ref 5–40)

## 2019-03-27 MED FILL — busPIRone HCL 10 MG TABS: 10 | 30 days supply | Qty: 60 | Fill #1

## 2019-03-30 DIAGNOSIS — F332 Major depressive disorder, recurrent severe without psychotic features: Secondary | ICD-10-CM | POA: Diagnosis not present

## 2019-03-30 DIAGNOSIS — F4322 Adjustment disorder with anxiety: Secondary | ICD-10-CM | POA: Diagnosis not present

## 2019-03-30 MED FILL — buPROPion HCL ER (XL) 300 M: 300 | 30 days supply | Qty: 30 | Fill #0

## 2019-03-30 MED FILL — hydrOXYzine HCL 25 MG TABS: 25 | 30 days supply | Qty: 90 | Fill #0

## 2019-04-17 MED FILL — buPROPion HCL ER (XL) 300 M: 300 | 30 days supply | Qty: 30 | Fill #0

## 2019-04-20 MED FILL — busPIRone HCL 10 MG TABS: 10 | 30 days supply | Qty: 60 | Fill #2

## 2019-05-11 MED FILL — buPROPion HCL ER (XL) 300 M: 300 | 30 days supply | Qty: 30 | Fill #1

## 2019-05-15 MED FILL — busPIRone HCL 10 MG TABS: 10 | 30 days supply | Qty: 60 | Fill #0

## 2019-06-10 MED FILL — buPROPion HCL ER (XL) 300 M: 300 | 30 days supply | Qty: 30 | Fill #2

## 2019-06-13 MED FILL — busPIRone HCL 10 MG TABS: 10 | 30 days supply | Qty: 60 | Fill #0

## 2019-06-14 MED FILL — busPIRone HCL 10 MG TABS: 10 | 30 days supply | Qty: 60 | Fill #0

## 2019-06-28 DIAGNOSIS — F4322 Adjustment disorder with anxiety: Secondary | ICD-10-CM | POA: Diagnosis not present

## 2019-06-28 DIAGNOSIS — F332 Major depressive disorder, recurrent severe without psychotic features: Secondary | ICD-10-CM | POA: Diagnosis not present

## 2019-06-28 MED FILL — hydrOXYzine HCL 25 MG TABS: 25 | 30 days supply | Qty: 90 | Fill #0

## 2019-07-08 MED FILL — busPIRone HCL 10 MG TABS: 10 | 30 days supply | Qty: 60 | Fill #1

## 2019-07-10 MED FILL — buPROPion HCL ER (XL) 300 M: 300 | 30 days supply | Qty: 30 | Fill #0

## 2019-07-22 MED FILL — hydrOXYzine HCL 25 MG TABS: 25 | 30 days supply | Qty: 90 | Fill #1

## 2019-08-03 MED FILL — buPROPion HCL ER (XL) 300 M: 300 | 30 days supply | Qty: 30 | Fill #1

## 2019-08-07 MED FILL — busPIRone HCL 10 MG TABS: 10 | 30 days supply | Qty: 60 | Fill #2

## 2019-08-21 MED FILL — hydrOXYzine HCL 25 MG TABS: 25 | 30 days supply | Qty: 90 | Fill #2

## 2019-08-28 MED FILL — buPROPion HCL ER (XL) 300 M: 300 | 30 days supply | Qty: 30 | Fill #2

## 2019-09-06 MED FILL — busPIRone HCL 10 MG TABS: 10 | 30 days supply | Qty: 60 | Fill #0

## 2019-09-20 MED FILL — hydrOXYzine HCL 25 MG TABS: 25 | 30 days supply | Qty: 90 | Fill #0

## 2019-09-26 DIAGNOSIS — F332 Major depressive disorder, recurrent severe without psychotic features: Secondary | ICD-10-CM | POA: Diagnosis not present

## 2019-09-26 DIAGNOSIS — F4322 Adjustment disorder with anxiety: Secondary | ICD-10-CM | POA: Diagnosis not present

## 2019-09-26 MED FILL — BUPROPION HCL ER (XL) 300 M: 300 | 30 days supply | Qty: 30 | Fill #0

## 2019-09-30 MED FILL — busPIRone HCL 10 MG TABS: 10 | 30 days supply | Qty: 60 | Fill #0

## 2019-10-14 MED FILL — hydrOXYzine HCL 25 MG TABS: 25 | 30 days supply | Qty: 90 | Fill #1

## 2019-10-23 MED FILL — BUPROPION HCL ER (XL) 300 M: 300 | 30 days supply | Qty: 30 | Fill #1

## 2019-10-24 MED FILL — busPIRone HCL 10 MG TABS: 10 | 30 days supply | Qty: 60 | Fill #1

## 2019-11-13 MED FILL — hydrOXYzine HCL 25 MG TABS: 25 | 30 days supply | Qty: 90 | Fill #2

## 2019-11-20 MED FILL — BUPROPION HCL XL 300 MG TAB: 300 | 30 days supply | Qty: 30 | Fill #2

## 2019-11-24 MED FILL — busPIRone HCL 10 MG TABS: 10 | 30 days supply | Qty: 60 | Fill #2

## 2019-12-13 MED FILL — hydrOXYzine HCL 25 MG TABS: 25 | 30 days supply | Qty: 90 | Fill #0

## 2019-12-20 DIAGNOSIS — F332 Major depressive disorder, recurrent severe without psychotic features: Secondary | ICD-10-CM | POA: Diagnosis not present

## 2019-12-20 DIAGNOSIS — F4322 Adjustment disorder with anxiety: Secondary | ICD-10-CM | POA: Diagnosis not present

## 2019-12-20 MED FILL — BuPROPion HCL ER (XL) 300 M: 300 | 30 days supply | Qty: 30 | Fill #0

## 2019-12-20 MED FILL — busPIRone HCL 10 MG TABS: 10 | 30 days supply | Qty: 60 | Fill #0

## 2020-01-08 MED FILL — hydrOXYzine HCL 25 MG TABS: 25 | 30 days supply | Qty: 90 | Fill #1

## 2020-01-14 MED FILL — busPIRone HCL 10 MG TABS: 10 | 30 days supply | Qty: 60 | Fill #1

## 2020-01-14 MED FILL — buPROPion HCL ER (XL) 300 M: 300 | 30 days supply | Qty: 30 | Fill #1

## 2020-02-05 MED FILL — hydrOXYzine HCL 25 MG TABS: 25 | 30 days supply | Qty: 90 | Fill #2

## 2020-02-12 MED FILL — busPIRone HCL 10 MG TABS: 10 | 30 days supply | Qty: 60 | Fill #2

## 2020-02-12 MED FILL — buPROPion HCL ER (XL) 300 M: 300 | 30 days supply | Qty: 30 | Fill #2

## 2020-03-07 ENCOUNTER — Encounter: Payer: 59 | Admitting: Family Medicine

## 2020-03-13 DIAGNOSIS — F4322 Adjustment disorder with anxiety: Secondary | ICD-10-CM | POA: Diagnosis not present

## 2020-03-13 DIAGNOSIS — F332 Major depressive disorder, recurrent severe without psychotic features: Secondary | ICD-10-CM | POA: Diagnosis not present

## 2020-03-13 MED FILL — busPIRone HCL 15 MG TABS: 15 | 30 days supply | Qty: 60 | Fill #0

## 2020-03-13 MED FILL — buPROPion HCL ER (XL) 300 M: 300 | 30 days supply | Qty: 30 | Fill #0

## 2020-03-19 ENCOUNTER — Other Ambulatory Visit: Payer: Self-pay

## 2020-03-19 ENCOUNTER — Encounter: Payer: Self-pay | Admitting: Family Medicine

## 2020-03-19 ENCOUNTER — Ambulatory Visit (INDEPENDENT_AMBULATORY_CARE_PROVIDER_SITE_OTHER): Payer: 59 | Admitting: Family Medicine

## 2020-03-19 VITALS — BP 122/60 | HR 76 | Ht 71.0 in | Wt 296.0 lb

## 2020-03-19 DIAGNOSIS — Z0000 Encounter for general adult medical examination without abnormal findings: Secondary | ICD-10-CM | POA: Diagnosis not present

## 2020-03-19 NOTE — Patient Instructions (Signed)

## 2020-03-19 NOTE — Progress Notes (Signed)
Date:  03/19/2020   Name:  Alex [name]   DOB:  Dec 03, 1980   MRN:  [id number]   Chief Complaint: Annual Exam  Patient is a 39 year old patient who presents for a comprehensive physical exam. The patient reports the following problems: none. Health maintenance has been reviewed up to date.   Lab Results  Component Value Date   CREATININE 0.94 03/07/2019   BUN 13 03/07/2019   NA 141 03/07/2019   K 4.8 03/07/2019   CL 104 03/07/2019   CO2 23 03/07/2019   Lab Results  Component Value Date   CHOL 141 03/07/2019   HDL 44 03/07/2019   LDLCALC 85 03/07/2019   TRIG 61 03/07/2019   CHOLHDL 3.2 03/07/2019   Lab Results  Component Value Date   TSH 2.165 12/15/2017   No results found for: HGBA1C Lab Results  Component Value Date   WBC 5.8 03/07/2019   HGB 13.9 03/07/2019   HCT 41.3 03/07/2019   MCV 89 03/07/2019   PLT 198 03/07/2019   Lab Results  Component Value Date   ALT 24 03/07/2019   AST 21 03/07/2019   ALKPHOS 56 03/07/2019   BILITOT 0.4 03/07/2019     Review of Systems  Constitutional: Negative.  Negative for chills, fatigue, fever and unexpected weight change.  HENT: Negative for congestion, ear discharge, ear pain, rhinorrhea, sinus pressure, sneezing and sore throat.   Eyes: Negative for photophobia, pain, discharge, redness and itching.  Respiratory: Negative for cough, shortness of breath, wheezing and stridor.   Cardiovascular: Negative for chest pain, palpitations and leg swelling.  Gastrointestinal: Positive for anal bleeding. Negative for abdominal pain, blood in stool, constipation, diarrhea, nausea and vomiting.  Endocrine: Negative for cold intolerance, heat intolerance, polydipsia, polyphagia and polyuria.  Genitourinary: Negative for dysuria, flank pain, frequency, hematuria and urgency.  Musculoskeletal: Negative for arthralgias, back pain and myalgias.  Skin: Negative for rash.  Allergic/Immunologic: Negative for environmental allergies  and food allergies.  Neurological: Negative for dizziness, weakness, light-headedness, numbness and headaches.  Hematological: Negative for adenopathy. Does not bruise/bleed easily.  Psychiatric/Behavioral: Negative for dysphoric mood. The patient is not nervous/anxious.     Patient Active Problem List   Diagnosis Date Noted  . MDD (major depressive disorder), single episode, severe , no psychosis ([place])   . MDD (major depressive disorder), recurrent severe, without psychosis ([place]) 12/13/2017  . Morbid obesity due to excess calories ([place]) 03/04/2016  . Essential hypertension 03/04/2016  . Hypersomnia with sleep apnea 01/14/2016  . Snoring 01/14/2016    No Known Allergies  No past surgical history on file.  Social History   Tobacco Use  . Smoking status: Never Smoker  . Smokeless tobacco: Never Used  Substance Use Topics  . Alcohol use: Yes    Alcohol/week: 2.0 standard drinks    Types: 2 Standard drinks or equivalent per week    Comment: occasional  . Drug use: No     Medication list has been reviewed and updated.  Current Meds  Medication Sig  . buPROPion (WELLBUTRIN XL) 300 MG 24 hr tablet Take 1 tablet (300 mg total) by mouth daily. For mood control  . busPIRone (BUSPAR) 10 MG tablet Take 2 tablets by mouth in the morning and at bedtime. DR A  . hydrOXYzine (ATARAX/VISTARIL) 25 MG tablet Take 1 tablet (25 mg total) by mouth 3 (three) times daily as needed for anxiety.  . traZODone (DESYREL) 50 MG tablet Take 1 tablet (50 mg  total) by mouth at bedtime as needed for sleep.    PHQ 2/9 Scores 03/19/2020 09/06/2018 06/07/2018  PHQ - 2 Score 0 1 1  PHQ- 9 Score 0 2 6    BP Readings from Last 3 Encounters:  03/19/20 122/60  03/07/19 100/60  09/06/18 102/70    Physical Exam Vitals and nursing note reviewed.  Constitutional:      Appearance: Normal appearance. Alex [name] is well-groomed and overweight.  HENT:     Head: Normocephalic.     Jaw: There is normal  jaw occlusion.     Salivary Glands: Right salivary gland is not diffusely enlarged. Left salivary gland is not diffusely enlarged.     Right Ear: Hearing, tympanic membrane, ear canal and external ear normal.     Left Ear: Hearing, tympanic membrane, ear canal and external ear normal.     Nose: Nose normal.     Right Turbinates: Not enlarged or pale.     Left Turbinates: Not enlarged or pale.     Mouth/Throat:     Lips: Pink.     Mouth: Mucous membranes are moist. Mucous membranes are pale.     Dentition: Normal dentition.     Tongue: No lesions.     Pharynx: Oropharynx is clear. Uvula midline.  Eyes:     General: Lids are normal. No scleral icterus.       Right eye: No discharge.        Left eye: No discharge.     Conjunctiva/sclera: Conjunctivae normal.     Pupils: Pupils are equal, round, and reactive to light.  Neck:     Thyroid: No thyroid mass, thyromegaly or thyroid tenderness.     Vascular: Normal carotid pulses. No carotid bruit, hepatojugular reflux or JVD.     Trachea: Trachea and phonation normal. No tracheal deviation.  Cardiovascular:     Rate and Rhythm: Normal rate and regular rhythm.     Chest Wall: PMI is not displaced. No thrill.     Pulses: Normal pulses. No decreased pulses.          Carotid pulses are 2+ on the right side and 2+ on the left side.      Radial pulses are 2+ on the right side and 2+ on the left side.       Femoral pulses are 2+ on the right side and 2+ on the left side.      Popliteal pulses are 2+ on the right side and 2+ on the left side.       Dorsalis pedis pulses are 2+ on the right side and 2+ on the left side.       Posterior tibial pulses are 2+ on the right side and 2+ on the left side.     Heart sounds: Normal heart sounds, S1 normal and S2 normal. No murmur. No systolic murmur. No diastolic murmur. No friction rub. No gallop. No S3 or S4 sounds.   Pulmonary:     Effort: No respiratory distress.     Breath sounds: Normal breath sounds  and air entry. No decreased breath sounds, wheezing, rhonchi or rales.  Chest:     Chest wall: No mass.     Breasts:        Right: Normal. No swelling, bleeding, inverted nipple, mass, nipple discharge, skin change or tenderness.        Left: Normal. No swelling, bleeding, inverted nipple, mass, nipple discharge, skin change or tenderness.  Abdominal:  General: Bowel sounds are normal.     Palpations: Abdomen is soft. There is no hepatomegaly, splenomegaly or mass.     Tenderness: There is no abdominal tenderness. There is no right CVA tenderness, left CVA tenderness, guarding or rebound.  Genitourinary:    Penis: Normal.      Testes: Normal.        Right: Mass not present.        Left: Mass not present.     Epididymis:     Right: Normal.     Left: Normal.     Prostate: Normal. Not enlarged, not tender and no nodules present.     Rectum: Guaiac result negative. External hemorrhoid present. No mass or tenderness.  Musculoskeletal:        General: No tenderness. Normal range of motion.     Cervical back: Normal, full passive range of motion without pain, normal range of motion and neck supple.     Thoracic back: Normal.     Lumbar back: Normal.     Right lower leg: No edema.     Left lower leg: No edema.  Lymphadenopathy:     Head:     Right side of head: No submandibular or tonsillar adenopathy.     Left side of head: No submandibular or tonsillar adenopathy.     Cervical: No cervical adenopathy.     Right cervical: No superficial, deep or posterior cervical adenopathy.    Left cervical: No superficial, deep or posterior cervical adenopathy.     Upper Body:     Right upper body: No supraclavicular or axillary adenopathy.     Left upper body: No supraclavicular or axillary adenopathy.  Skin:    General: Skin is warm.     Capillary Refill: Capillary refill takes less than 2 seconds.     Findings: No rash.     Comments: Insect bite right knee  Neurological:     Mental  Status: Alex [name] is alert and oriented to person, place, and time.     Cranial Nerves: Cranial nerves are intact. No cranial nerve deficit.     Sensory: Sensation is intact.     Motor: Motor function is intact.     Deep Tendon Reflexes: Reflexes are normal and symmetric.  Psychiatric:        Behavior: Behavior is cooperative.     Wt Readings from Last 3 Encounters:  03/19/20 296 lb (134.3 kg)  03/07/19 290 lb (131.5 kg)  09/06/18 283 lb (128.4 kg)    BP 122/60   Pulse 76   Ht 5\' 11"  (1.803 m)   Wt 296 lb (134.3 kg)   BMI 41.28 kg/m   Assessment and Plan: 1. Annual physical exam No subjective/objective concerns noted during the history and physical exam.  Patient is chart was reviewed and there is no concerns noted in most recent encounters, most recent labs, most recent imaging, and care everywhere.  Patient has had a colonoscopy that was done in [place] and he is going to obtain the doctor's name and sign a release of info for [place] to get that sent and placed in his chart.Alex [name] is a 39 y.o. adult who presents today for Alex [name] Complete Annual Exam. Alex [name] feels well. Alex [name] reports exercising . Alex [name] reports Alex [name] is sleeping well.Immunizations are reviewed and recommendations provided.   Age appropriate screening tests are discussed. Counseling given for risk factor reduction interventions.  We will obtain a CMP and lipid panel.  And is also been given a Mediterranean diet he has been losing weight gradually on weight watchers diet and we have encouraged him to continue to do so. - Comprehensive metabolic panel - Lipid Panel With LDL/HDL Ratio

## 2020-03-20 LAB — COMPREHENSIVE METABOLIC PANEL
ALT: 25 IU/L (ref 0–44)
AST: 25 IU/L (ref 0–40)
Albumin/Globulin Ratio: 1.9 (ref 1.2–2.2)
Albumin: 4.4 g/dL (ref 4.0–5.0)
Alkaline Phosphatase: 78 IU/L (ref 48–121)
BUN/Creatinine Ratio: 13 (ref 9–20)
BUN: 12 mg/dL (ref 6–20)
Bilirubin Total: 0.5 mg/dL (ref 0.0–1.2)
CO2: 22 mmol/L (ref 20–29)
Calcium: 9.1 mg/dL (ref 8.7–10.2)
Chloride: 105 mmol/L (ref 96–106)
Creatinine, Ser: 0.95 mg/dL (ref 0.76–1.27)
GFR calc Af Amer: 119 mL/min/{1.73_m2} (ref >59)
GFR calc non Af Amer: 103 mL/min/{1.73_m2} (ref >59)
Globulin, Total: 2.3 g/dL (ref 1.5–4.5)
Glucose: 81 mg/dL (ref 65–99)
Potassium: 4.6 mmol/L (ref 3.5–5.2)
Sodium: 143 mmol/L (ref 134–144)
Total Protein: 6.7 g/dL (ref 6.0–8.5)

## 2020-03-20 LAB — LIPID PANEL WITH LDL/HDL RATIO
Cholesterol, Total: 169 mg/dL (ref 100–199)
HDL: 51 mg/dL (ref >39)
LDL Chol Calc (NIH): 104 mg/dL — ABNORMAL HIGH (ref 0–99)
LDL/HDL Ratio: 2 ratio (ref 0.0–3.6)
Triglycerides: 71 mg/dL (ref 0–149)
VLDL Cholesterol Cal: 14 mg/dL (ref 5–40)

## 2020-04-12 MED FILL — buPROPion HCL ER (XL) 300 M: 300 | 30 days supply | Qty: 30 | Fill #1

## 2020-04-29 MED FILL — hydrOXYzine HCL 25 MG TABS: 25 | 30 days supply | Qty: 90 | Fill #2

## 2020-05-20 ENCOUNTER — Other Ambulatory Visit (HOSPITAL_COMMUNITY): Payer: Self-pay | Admitting: Psychiatry

## 2020-05-20 DIAGNOSIS — F332 Major depressive disorder, recurrent severe without psychotic features: Secondary | ICD-10-CM | POA: Diagnosis not present

## 2020-05-20 DIAGNOSIS — F4322 Adjustment disorder with anxiety: Secondary | ICD-10-CM | POA: Diagnosis not present

## 2020-05-20 MED FILL — busPIRone HCL 15 MG TABS: 15 | 30 days supply | Qty: 60 | Fill #0

## 2020-05-20 MED FILL — buPROPion HCL ER (XL) 300 M: 300 | 30 days supply | Qty: 30 | Fill #0

## 2020-05-22 ENCOUNTER — Encounter: Payer: Self-pay | Admitting: Family Medicine

## 2020-05-24 ENCOUNTER — Ambulatory Visit (HOSPITAL_COMMUNITY)
Admission: EM | Admit: 2020-05-24 | Discharge: 2020-05-24 | Disposition: A | Discharge status: Home / Self Care | Payer: 59

## 2020-05-24 NOTE — ED Notes (Signed)
Per pt access rep, Cindy, pt desired ultrasound of leg to "make sure it's not a blood clot". When pt discovered we cannot do u/s directly here, he opted to go to ER directly.

## 2020-05-29 MED FILL — HYDROXYZINE HCL 25 MG TABS: 25 | 30 days supply | Qty: 90 | Fill #0

## 2020-06-04 ENCOUNTER — Ambulatory Visit: Payer: 59 | Admitting: Family Medicine

## 2020-06-17 MED FILL — buPROPion HCL ER (XL) 300 M: 300 | 30 days supply | Qty: 30 | Fill #1

## 2020-06-17 MED FILL — busPIRone HCL 15 MG TABS: 15 | 30 days supply | Qty: 60 | Fill #1

## 2020-06-18 ENCOUNTER — Ambulatory Visit: Payer: 59 | Admitting: Family Medicine

## 2020-06-24 MED FILL — MELOXICAM 15 MG TABLET: 15 | 30 days supply | Qty: 30 | Fill #0

## 2020-06-26 MED FILL — HYDROXYZINE HCL 25 MG TABS: 25 | 30 days supply | Qty: 90 | Fill #1

## 2020-07-13 MED FILL — buPROPion HCL ER (XL) 300 M: 300 | 30 days supply | Qty: 30 | Fill #2

## 2020-07-13 MED FILL — BUSPIRONE HCL 15 MG TABS: 15 | 30 days supply | Qty: 60 | Fill #2

## 2020-07-14 ENCOUNTER — Encounter: Payer: Self-pay | Admitting: Family Medicine

## 2020-07-16 ENCOUNTER — Ambulatory Visit (INDEPENDENT_AMBULATORY_CARE_PROVIDER_SITE_OTHER): Payer: 59 | Admitting: Family Medicine

## 2020-07-16 ENCOUNTER — Other Ambulatory Visit: Payer: Self-pay

## 2020-07-16 ENCOUNTER — Encounter: Payer: Self-pay | Admitting: Family Medicine

## 2020-07-16 VITALS — BP 110/60 | HR 80 | Ht 71.0 in | Wt 300.0 lb

## 2020-07-16 DIAGNOSIS — J45990 Exercise induced bronchospasm: Secondary | ICD-10-CM

## 2020-07-16 MED ORDER — ALBUTEROL SULFATE HFA 108 (90 BASE) MCG/ACT IN AERS: 1.0000 | INHALATION_SPRAY | Freq: Four times a day (QID) | RESPIRATORY_TRACT | 11 refills | Status: DC | PRN

## 2020-07-16 MED FILL — ALBUTEROL SULFATE HFA 108 (: 108 (90 BAS | 25 days supply | Qty: 18 | Fill #0

## 2020-07-16 NOTE — Patient Instructions (Signed)
Exercise-Induced Bronchoconstriction, Adult  Exercise-induced bronchospasm (EIB) happens when the airways narrow during or after vigorous activity or exercise. The airways are the passages that lead from the nose and mouth down into the lungs. When the airways narrow, this can cause coughing, wheezing, and shortness of breath. This makes it hard to breathe. Anyone can develop EIB, even people who do not have allergies or asthma. With proper treatment, most people with EIB can be active and exercise normally. What are the causes? The exact cause of this condition is not known. Symptoms are brought on (triggered) by physical activity. EIB can also be triggered by:  Breathing very cold and dry or hot and humid air.  Chemicals, such as chlorine in swimming pools, pesticides, or fertilizers.  Outdoor triggers, such as: Geneticist, molecular pollution. ? Car exhaust. ? Pollen from grass, trees, or flowers. ? Campfire smoke.  Indoor triggers, such as: ? Dust. ? Mold. ? Tobacco smoke. ? Cleaning solutions. ? Animal dander. What increases the risk? You are more likely to develop this condition if you:  Have asthma.  Participate in sports that require constant motion, such as basketball, hockey, skiing, and swimming.  Work outdoors.  Exercise where there are higher levels of one or more EIB triggers. What are the signs or symptoms? Symptoms of this condition include:  Coughing.  Wheezing.  Shortness of breath.  Chest pain or tightness.  Sore throat.  Upset stomach. Symptoms may worsen after exercise has stopped. How is this diagnosed? EIB is diagnosed with your medical history and a physical exam. You may also have other tests, including:  Lung function studies (spirometry).  An exercise test to check for EIB symptoms.  Allergy tests. How is this treated? Treatment for EIB includes preventing symptoms when possible, and treating EIB quickly when symptoms occur. Treatment may  include:  Taking medicine that your health care provider prescribes. Medicine comes in different forms, including: ? Medicines that you breathe in (inhale). These include:  Steroids. These help to control your symptoms and are usually taken every day.  Quick relief medicines. These help to quickly relieve your breathing difficulty. ? Medicines that you take by mouth (orally). These help to control allergies and asthma.  Avoiding triggers.  Stopping physical activity or exercise to rest. Follow these instructions at home:  Take over-the-counter and prescription medicines only as told by your health care provider.  Do not use products that contain nicotine or tobacco, such as cigarettes, e-cigarettes, and chewing tobacco. If you need help quitting, ask your health care provider.  Make changes in your workout as told by your health care provider. Exercise is important to your health and well-being. ? Keep quick relief medicine with you when you are exercising. ? Tell your exercise partners about your condition. Wear a medical ID bracelet. ? If you are planning to exercise alone or in an isolated area, let someone know where you are going and when you will be back.  You may need to see a health care provider who specializes in allergies (allergist) or the lungs (pulmonologist) for more tests.  Keep all follow-up visits as told by your health care provider. This is important. How is this prevented?  Take medicines to prevent exercise-induced bronchospasm as told by your health care provider.  Warm up before starting to play sports or exercise.  Exercise indoors to avoid outdoor triggers.  Cover your nose and mouth with a scarf to warm air that is very cold.  Tell your workout  partners or trainer about your condition. Tell them how to help you if you have an episode. Contact a health care provider if:  You have coughing, wheezing, or shortness of breath that continues after  treatment.  Your coughing wakes you up at night.  You have less endurance than you used to. Get help right away if:  Your medicine is not helping you breathe better.  You cannot catch your breath.  You pass out. Summary  Exercise-induced bronchospasm (EIB) happens when the airways narrow during or after exercise.  When the airways narrow, this can cause coughing, wheezing, and shortness of breath. It can be difficult to breathe.  Take over-the-counter and prescription medicines only as told by your health care provider.  Make changes in your workout as told by your health care provider.  Contact a health care provider if you continue to have trouble breathing after treatment. This information is not intended to replace advice given to you by your health care provider. Make sure you discuss any questions you have with your health care provider. Document Revised: 06/30/2018 Document Reviewed: 07/05/2018 Elsevier Patient Education  [place].

## 2020-07-16 NOTE — Progress Notes (Signed)
Date:  07/16/2020   Name:  Alex [name]   DOB:  10/22/1981   MRN:  [id number]   Chief Complaint: Asthma (exercise induced- refill inhaler)  Asthma Alex [name] complains of chest tightness, cough and wheezing. There is no difficulty breathing, frequent throat clearing, hemoptysis, hoarse voice, shortness of breath or sputum production. This is a chronic problem. The current episode started more than 1 year ago. The problem has been waxing and waning. Pertinent negatives include no ear pain, fever, headaches, myalgias, rhinorrhea, sneezing or sore throat. Alex [name]'s symptoms are aggravated by change in weather, any activity and pollen. Alex [name]'s symptoms are alleviated by beta-agonist. Alex [name] past medical history is significant for asthma.    Lab Results  Component Value Date   CREATININE 0.95 03/19/2020   BUN 12 03/19/2020   NA 143 03/19/2020   K 4.6 03/19/2020   CL 105 03/19/2020   CO2 22 03/19/2020   Lab Results  Component Value Date   CHOL 169 03/19/2020   HDL 51 03/19/2020   LDLCALC 104 (H) 03/19/2020   TRIG 71 03/19/2020   CHOLHDL 3.2 03/07/2019   Lab Results  Component Value Date   TSH 2.165 12/15/2017   No results found for: HGBA1C Lab Results  Component Value Date   WBC 5.8 03/07/2019   HGB 13.9 03/07/2019   HCT 41.3 03/07/2019   MCV 89 03/07/2019   PLT 198 03/07/2019   Lab Results  Component Value Date   ALT 25 03/19/2020   AST 25 03/19/2020   ALKPHOS 78 03/19/2020   BILITOT 0.5 03/19/2020     Review of Systems  Constitutional: Negative.  Negative for chills, fatigue, fever and unexpected weight change.  HENT: Negative for congestion, ear discharge, ear pain, hoarse voice, rhinorrhea, sinus pressure, sneezing and sore throat.   Eyes: Negative for photophobia, pain, discharge, redness and itching.  Respiratory: Positive for cough and wheezing. Negative for hemoptysis, sputum production, shortness of breath and stridor.    Gastrointestinal: Negative for abdominal pain, blood in stool, constipation, diarrhea, nausea and vomiting.  Endocrine: Negative for cold intolerance, heat intolerance, polydipsia, polyphagia and polyuria.  Genitourinary: Negative for dysuria, flank pain, frequency, hematuria and urgency.  Musculoskeletal: Negative for arthralgias, back pain and myalgias.  Skin: Negative for rash.  Allergic/Immunologic: Negative for environmental allergies and food allergies.  Neurological: Negative for dizziness, weakness, light-headedness, numbness and headaches.  Hematological: Negative for adenopathy. Does not bruise/bleed easily.  Psychiatric/Behavioral: Negative for dysphoric mood. The patient is not nervous/anxious.     Patient Active Problem List   Diagnosis Date Noted  . MDD (major depressive disorder), single episode, severe , no psychosis ([place])   . MDD (major depressive disorder), recurrent severe, without psychosis ([place]) 12/13/2017  . Morbid obesity due to excess calories ([place]) 03/04/2016  . Essential hypertension 03/04/2016  . Hypersomnia with sleep apnea 01/14/2016  . Snoring 01/14/2016    No Known Allergies  No past surgical history on file.  Social History   Tobacco Use  . Smoking status: Never Smoker  . Smokeless tobacco: Never Used  Substance Use Topics  . Alcohol use: Yes    Alcohol/week: 2.0 standard drinks    Types: 2 Standard drinks or equivalent per week    Comment: occasional  . Drug use: No     Medication list has been reviewed and updated.  Current Meds  Medication Sig  . albuterol (VENTOLIN HFA) 108 (90 Base) MCG/ACT inhaler Inhale 1-2  puffs into the lungs every 6 (six) hours as needed for wheezing or shortness of breath. 1-2 puffs every 6 hours as needed  . buPROPion (WELLBUTRIN XL) 300 MG 24 hr tablet Take 1 tablet (300 mg total) by mouth daily. For mood control  . busPIRone (BUSPAR) 10 MG tablet Take 2 tablets by mouth in the morning and at bedtime. DR A    . hydrOXYzine (ATARAX/VISTARIL) 25 MG tablet Take 1 tablet (25 mg total) by mouth 3 (three) times daily as needed for anxiety.  . meloxicam (MOBIC) 15 MG tablet Take 15 mg by mouth daily. Dr Chauncey Cruel  . traZODone (DESYREL) 50 MG tablet Take 1 tablet (50 mg total) by mouth at bedtime as needed for sleep.    PHQ 2/9 Scores 07/16/2020 03/19/2020 09/06/2018 06/07/2018  PHQ - 2 Score 0 0 1 1  PHQ- 9 Score 0 0 2 6    GAD 7 : Generalized Anxiety Score 07/16/2020 03/19/2020  Nervous, Anxious, on Edge 0 1  Control/stop worrying 0 1  Worry too much - different things 0 1  Trouble relaxing 0 1  Restless 0 1  Easily annoyed or irritable 0 0  Afraid - awful might happen 0 1  Total GAD 7 Score 0 6  Anxiety Difficulty - Not difficult at all    BP Readings from Last 3 Encounters:  07/16/20 110/60  03/19/20 122/60  03/07/19 100/60    Physical Exam Vitals and nursing note reviewed.  HENT:     Head: Normocephalic.     Right Ear: External ear normal.     Left Ear: External ear normal.     Nose: Nose normal.     Mouth/Throat:     Mouth: Mucous membranes are moist.  Eyes:     General: No scleral icterus.       Right eye: No discharge.        Left eye: No discharge.     Conjunctiva/sclera: Conjunctivae normal.     Pupils: Pupils are equal, round, and reactive to light.  Neck:     Thyroid: No thyromegaly.     Vascular: No JVD.     Trachea: No tracheal deviation.  Cardiovascular:     Rate and Rhythm: Normal rate and regular rhythm.     Heart sounds: Normal heart sounds. No murmur heard.  No friction rub. No gallop.   Pulmonary:     Effort: No respiratory distress.     Breath sounds: Normal breath sounds. No stridor. No wheezing, rhonchi or rales.  Abdominal:     General: Bowel sounds are normal. There is no distension.     Palpations: Abdomen is soft. There is no mass.     Tenderness: There is no abdominal tenderness. There is no guarding or rebound.     Hernia: No hernia is present.   Musculoskeletal:        General: No tenderness. Normal range of motion.     Cervical back: Normal range of motion and neck supple.  Lymphadenopathy:     Cervical: No cervical adenopathy.  Skin:    General: Skin is warm.     Findings: No rash.     Comments: seb ker calf  Neurological:     Mental Status: Alex [name] is alert and oriented to person, place, and time.     Cranial Nerves: No cranial nerve deficit.     Deep Tendon Reflexes: Reflexes are normal and symmetric.     Wt Readings from Last 3  Encounters:  07/16/20 300 lb (136.1 kg)  03/19/20 296 lb (134.3 kg)  03/07/19 290 lb (131.5 kg)    BP 110/60   Pulse 80   Ht 5\' 11"  (1.803 m)   Wt 300 lb (136.1 kg)   BMI 41.84 kg/m   Assessment and Plan:  1. Asthma, exercise induced Chronic.  Intermittent.  Mild.  Patient will continue albuterol inhaler 1 to 2 puffs as needed for wheezing as as needed. - albuterol (VENTOLIN HFA) 108 (90 Base) MCG/ACT inhaler; Inhale 1-2 puffs into the lungs every 6 (six) hours as needed for wheezing or shortness of breath. 1-2 puffs every 6 hours as needed  Dispense: 6.7 g; Refill: 11

## 2020-07-23 MED FILL — HYDROXYZINE HCL 25 MG TABS: 25 | 30 days supply | Qty: 90 | Fill #2

## 2020-08-12 MED FILL — buPROPion HCL ER (XL) 300 M: 300 | 30 days supply | Qty: 30 | Fill #3

## 2020-08-12 MED FILL — BUSPIRONE HCL 15 MG TABS: 15 | 30 days supply | Qty: 60 | Fill #3

## 2020-08-21 MED FILL — HYDROXYZINE HCL 25 MG TABS: 25 | 30 days supply | Qty: 90 | Fill #3

## 2020-09-04 ENCOUNTER — Other Ambulatory Visit (HOSPITAL_COMMUNITY): Payer: Self-pay | Admitting: Psychiatry

## 2020-09-04 DIAGNOSIS — F4322 Adjustment disorder with anxiety: Secondary | ICD-10-CM | POA: Diagnosis not present

## 2020-09-04 DIAGNOSIS — F332 Major depressive disorder, recurrent severe without psychotic features: Secondary | ICD-10-CM | POA: Diagnosis not present

## 2020-09-05 MED FILL — buPROPion HCL ER (XL) 300 M: 300 | 30 days supply | Qty: 30 | Fill #0

## 2020-09-05 MED FILL — BUSPIRONE HCL 15 MG TABS: 15 | 30 days supply | Qty: 60 | Fill #0

## 2020-09-20 MED FILL — HYDROXYZINE HCL 25 MG TABS: 25 | 30 days supply | Qty: 90 | Fill #0

## 2020-09-30 MED FILL — buPROPion HCL ER (XL) 300 M: 300 | 30 days supply | Qty: 30 | Fill #1

## 2020-09-30 MED FILL — BUSPIRONE HCL 15 MG TABS: 15 | 30 days supply | Qty: 60 | Fill #1

## 2020-10-16 MED FILL — HYDROXYZINE HCL 25 MG TABS: 25 | 30 days supply | Qty: 90 | Fill #1

## 2020-10-23 DIAGNOSIS — Z20828 Contact with and (suspected) exposure to other viral communicable diseases: Secondary | ICD-10-CM | POA: Diagnosis not present

## 2020-10-30 MED FILL — buPROPion HCL ER (XL) 300 M: 300 | 30 days supply | Qty: 30 | Fill #2

## 2020-10-30 MED FILL — BUSPIRONE HCL 15 MG TABS: 15 | 30 days supply | Qty: 60 | Fill #2

## 2020-11-13 MED FILL — HYDROXYZINE HCL 25 MG TABS: 25 | 30 days supply | Qty: 90 | Fill #2

## 2020-11-28 MED FILL — buPROPion HCL ER (XL) 300 M: 300 | 30 days supply | Qty: 30 | Fill #3

## 2020-11-28 MED FILL — BUSPIRONE HCL 15 MG TABS: 15 | 30 days supply | Qty: 60 | Fill #3

## 2020-12-13 MED FILL — HYDROXYZINE HCL 25 MG TABS: 25 | 30 days supply | Qty: 90 | Fill #3

## 2020-12-30 ENCOUNTER — Other Ambulatory Visit (HOSPITAL_COMMUNITY): Payer: Self-pay | Admitting: Psychiatry

## 2020-12-30 DIAGNOSIS — F4322 Adjustment disorder with anxiety: Secondary | ICD-10-CM | POA: Diagnosis not present

## 2020-12-30 DIAGNOSIS — F332 Major depressive disorder, recurrent severe without psychotic features: Secondary | ICD-10-CM | POA: Diagnosis not present

## 2020-12-30 MED FILL — BUSPIRONE HCL 15 MG TABS: 15 | 30 days supply | Qty: 60 | Fill #0

## 2020-12-30 MED FILL — buPROPion HCL ER (XL) 300 M: 300 | 30 days supply | Qty: 30 | Fill #0

## 2021-01-14 ENCOUNTER — Other Ambulatory Visit (HOSPITAL_BASED_OUTPATIENT_CLINIC_OR_DEPARTMENT_OTHER): Payer: Self-pay

## 2021-01-23 MED FILL — buPROPion HCL ER (XL) 300 M: 300 | 30 days supply | Qty: 30 | Fill #1

## 2021-01-23 MED FILL — BUSPIRONE HCL 15 MG TABS: 15 | 30 days supply | Qty: 60 | Fill #1

## 2021-04-07 ENCOUNTER — Other Ambulatory Visit (HOSPITAL_COMMUNITY): Payer: Self-pay

## 2021-04-18 ENCOUNTER — Other Ambulatory Visit (HOSPITAL_COMMUNITY): Payer: Self-pay

## 2021-04-18 MED FILL — Bupropion HCl Tab ER 24HR 300 MG: ORAL | 30 days supply | Qty: 30 | Fill #0 | Status: CN

## 2021-04-22 ENCOUNTER — Other Ambulatory Visit (HOSPITAL_COMMUNITY): Payer: Self-pay

## 2021-04-22 MED FILL — Bupropion HCl Tab ER 24HR 300 MG: ORAL | 30 days supply | Qty: 30 | Fill #0 | Status: AC

## 2021-04-23 ENCOUNTER — Other Ambulatory Visit (HOSPITAL_COMMUNITY): Payer: Self-pay

## 2021-04-23 DIAGNOSIS — F332 Major depressive disorder, recurrent severe without psychotic features: Secondary | ICD-10-CM | POA: Diagnosis not present

## 2021-04-23 DIAGNOSIS — F4322 Adjustment disorder with anxiety: Secondary | ICD-10-CM | POA: Diagnosis not present

## 2021-04-23 MED ORDER — BUSPIRONE HCL 15 MG PO TABS
ORAL_TABLET | ORAL | 3 refills | Status: DC
  Filled 2021-04-23: qty 60, 30d supply, fill #0

## 2021-04-23 MED ORDER — HYDROXYZINE HCL 25 MG PO TABS
ORAL_TABLET | ORAL | 3 refills | Status: DC
  Filled 2021-04-23: qty 90, 30d supply, fill #0

## 2021-04-23 MED ORDER — BUPROPION HCL ER (XL) 300 MG PO TB24
300.0000 mg | ORAL_TABLET | Freq: Every day | ORAL | 3 refills | Status: DC
  Filled 2021-04-23: qty 30, 30d supply, fill #0

## 2021-04-24 ENCOUNTER — Other Ambulatory Visit (HOSPITAL_COMMUNITY): Payer: Self-pay

## 2021-05-01 ENCOUNTER — Other Ambulatory Visit (HOSPITAL_COMMUNITY): Payer: Self-pay

## 2021-05-09 DIAGNOSIS — Z20822 Contact with and (suspected) exposure to covid-19: Secondary | ICD-10-CM | POA: Diagnosis not present

## 2021-05-20 ENCOUNTER — Other Ambulatory Visit (HOSPITAL_COMMUNITY): Payer: Self-pay

## 2021-05-30 ENCOUNTER — Other Ambulatory Visit (HOSPITAL_COMMUNITY): Payer: Self-pay

## 2021-05-30 MED FILL — Bupropion HCl Tab ER 24HR 300 MG: ORAL | 30 days supply | Qty: 30 | Fill #1 | Status: AC

## 2021-06-04 ENCOUNTER — Other Ambulatory Visit: Payer: Self-pay

## 2021-06-04 ENCOUNTER — Encounter: Payer: Self-pay | Admitting: Family Medicine

## 2021-06-04 ENCOUNTER — Ambulatory Visit
Admission: RE | Admit: 2021-06-04 | Discharge: 2021-06-04 | Disposition: A | Discharge status: Home / Self Care | Payer: 59 | Source: Ambulatory Visit | Attending: Family Medicine | Admitting: Family Medicine

## 2021-06-04 ENCOUNTER — Ambulatory Visit
Admission: RE | Admit: 2021-06-04 | Discharge: 2021-06-04 | Disposition: A | Discharge status: Home / Self Care | Payer: 59 | Attending: Family Medicine | Admitting: Family Medicine

## 2021-06-04 ENCOUNTER — Ambulatory Visit (INDEPENDENT_AMBULATORY_CARE_PROVIDER_SITE_OTHER): Payer: 59 | Admitting: Family Medicine

## 2021-06-04 VITALS — BP 120/88 | HR 80 | Ht 71.0 in | Wt 319.0 lb

## 2021-06-04 DIAGNOSIS — R002 Palpitations: Secondary | ICD-10-CM | POA: Diagnosis not present

## 2021-06-04 DIAGNOSIS — R059 Cough, unspecified: Secondary | ICD-10-CM | POA: Diagnosis not present

## 2021-06-04 DIAGNOSIS — R0609 Other forms of dyspnea: Secondary | ICD-10-CM

## 2021-06-04 DIAGNOSIS — R079 Chest pain, unspecified: Secondary | ICD-10-CM | POA: Insufficient documentation

## 2021-06-04 DIAGNOSIS — U099 Post covid-19 condition, unspecified: Secondary | ICD-10-CM

## 2021-06-04 DIAGNOSIS — J45990 Exercise induced bronchospasm: Secondary | ICD-10-CM | POA: Diagnosis not present

## 2021-06-04 DIAGNOSIS — Z1211 Encounter for screening for malignant neoplasm of colon: Secondary | ICD-10-CM | POA: Diagnosis not present

## 2021-06-04 DIAGNOSIS — R0600 Dyspnea, unspecified: Secondary | ICD-10-CM | POA: Diagnosis not present

## 2021-06-04 NOTE — Progress Notes (Signed)
Date:  06/04/2021   Name:  Alex [name]   DOB:  Jan 21, 1981   MRN:  [id number]   Chief Complaint: Annual Exam  Alex [name] is a 40 y.o. adult who presents today for Alex [name] Complete Annual Exam. Alex [name] Alex [name] feels well. Alex [name] reports exercising weekly 3 to 4 times. Alex [name] reports Alex [name] is sleeping well.    Palpitations  This is a chronic problem. The current episode started more than 1 month ago (9 months). Progression since onset: one episode. Associated symptoms include chest pain, coughing, near-syncope and shortness of breath. Pertinent negatives include no diaphoresis, fever, irregular heartbeat, malaise/fatigue, nausea or syncope.  Chest Pain  This is a new (since covid) problem. The current episode started more than 1 year ago. The problem occurs intermittently. The problem has been waxing and waning. The pain is present in the substernal region. The pain is mild. The quality of the pain is described as tightness. The pain does not radiate. Associated symptoms include a cough, exertional chest pressure, near-syncope, palpitations and shortness of breath. Pertinent negatives include no abdominal pain, diaphoresis, fever, hemoptysis, irregular heartbeat, malaise/fatigue, nausea, orthopnea, sputum production or syncope. Treatments tried: B agonist. The treatment provided moderate relief.  Shortness of Breath This is a new problem. The current episode started more than 1 month ago. The problem occurs intermittently. The problem has been waxing and waning. Associated symptoms include chest pain and wheezing. Pertinent negatives include no abdominal pain, fever, hemoptysis, orthopnea, sputum production or syncope. [name] has tried beta agonist inhalers for the symptoms. The treatment provided moderate relief. Alex [name] past medical history is significant for asthma.  Asthma Alex [name] complains of chest tightness, cough,  frequent throat clearing, shortness of breath and wheezing. There is no difficulty breathing, hemoptysis, hoarse voice or sputum production. Episode onset: moreso postcovid. The problem occurs intermittently. The problem has been waxing and waning. The cough is non-productive. Associated symptoms include chest pain. Pertinent negatives include no fever or malaise/fatigue. Alex [name] past medical history is significant for asthma.   Lab Results  Component Value Date   CREATININE 0.95 03/19/2020   BUN 12 03/19/2020   NA 143 03/19/2020   K 4.6 03/19/2020   CL 105 03/19/2020   CO2 22 03/19/2020   Lab Results  Component Value Date   CHOL 169 03/19/2020   HDL 51 03/19/2020   LDLCALC 104 (H) 03/19/2020   TRIG 71 03/19/2020   CHOLHDL 3.2 03/07/2019   Lab Results  Component Value Date   TSH 2.165 12/15/2017   No results found for: HGBA1C Lab Results  Component Value Date   WBC 5.8 03/07/2019   HGB 13.9 03/07/2019   HCT 41.3 03/07/2019   MCV 89 03/07/2019   PLT 198 03/07/2019   Lab Results  Component Value Date   ALT 25 03/19/2020   AST 25 03/19/2020   ALKPHOS 78 03/19/2020   BILITOT 0.5 03/19/2020     Review of Systems  Constitutional:  Negative for chills, diaphoresis, fever and malaise/fatigue.  HENT: Negative.  Negative for hoarse voice.   Respiratory:  Positive for cough, shortness of breath and wheezing. Negative for hemoptysis and sputum production.   Cardiovascular:  Positive for chest pain, palpitations and near-syncope. Negative for orthopnea and syncope.  Gastrointestinal:  Negative for abdominal pain and nausea.  Genitourinary: Negative.   Allergic/Immunologic: Positive for environmental allergies.  Neurological: Negative.  Patient Active Problem List   Diagnosis Date Noted   MDD (major depressive disorder), single episode, severe , no psychosis ([place])    MDD (major depressive disorder), recurrent severe, without psychosis ([place]) 12/13/2017   Morbid  obesity due to excess calories ([place]) 03/04/2016   Essential hypertension 03/04/2016   Hypersomnia with sleep apnea 01/14/2016   Snoring 01/14/2016    No Known Allergies  No past surgical history on file.  Social History   Tobacco Use   Smoking status: Never   Smokeless tobacco: Never  Substance Use Topics   Alcohol use: Yes    Alcohol/week: 2.0 standard drinks    Types: 2 Standard drinks or equivalent per week    Comment: occasional   Drug use: No     Medication list has been reviewed and updated.  Current Meds  Medication Sig   albuterol (VENTOLIN HFA) 108 (90 Base) MCG/ACT inhaler Inhale 1-2 puffs into the lungs every 6 (six) hours as needed for wheezing or shortness of breath. 1-2 puffs every 6 hours as needed   buPROPion (WELLBUTRIN XL) 300 MG 24 hr tablet TAKE 1 TABLET BY MOUTH DAILY   busPIRone (BUSPAR) 15 MG tablet TAKE 1 TABLET BY MOUTH TWICE A DAY FOR ANXIETY   hydrOXYzine (ATARAX/VISTARIL) 25 MG tablet Take 1 tablet (25 mg total) by mouth 3 (three) times daily as needed for anxiety.   [DISCONTINUED] buPROPion (WELLBUTRIN XL) 300 MG 24 hr tablet Take 1 tablet (300 mg total) by mouth daily. For mood control    PHQ 2/9 Scores 06/04/2021 07/16/2020 03/19/2020 09/06/2018  PHQ - 2 Score 0 0 0 1  PHQ- 9 Score 0 0 0 2    GAD 7 : Generalized Anxiety Score 06/04/2021 07/16/2020 03/19/2020  Nervous, Anxious, on Edge 0 0 1  Control/stop worrying 1 0 1  Worry too much - different things 1 0 1  Trouble relaxing 1 0 1  Restless 0 0 1  Easily annoyed or irritable 1 0 0  Afraid - awful might happen 0 0 1  Total GAD 7 Score 4 0 6  Anxiety Difficulty Not difficult at all - Not difficult at all    BP Readings from Last 3 Encounters:  06/04/21 120/88  07/16/20 110/60  03/19/20 122/60    Physical Exam Vitals and nursing note reviewed. Exam conducted with a chaperone present.  Constitutional:      General: Alex [name] is not in acute distress.    Appearance: CESC VINCENTE is not diaphoretic.  HENT:     Head: Normocephalic and atraumatic.     Right Ear: Tympanic membrane, ear canal and external ear normal.     Left Ear: Tympanic membrane, ear canal and external ear normal.     Nose: Nose normal.  Eyes:     General:        Right eye: No discharge.        Left eye: No discharge.     Conjunctiva/sclera: Conjunctivae normal.     Pupils: Pupils are equal, round, and reactive to light.  Neck:     Thyroid: No thyromegaly.     Vascular: No JVD.  Cardiovascular:     Rate and Rhythm: Normal rate and regular rhythm.     Chest Wall: PMI is not displaced.     Pulses: Normal pulses.     Heart sounds: Normal heart sounds, S1 normal and S2 normal. No murmur heard. No systolic murmur is present.  No diastolic murmur is  present.    No friction rub. No gallop. No S3 or S4 sounds.  Pulmonary:     Effort: Pulmonary effort is normal.     Breath sounds: Normal breath sounds.  Abdominal:     General: Bowel sounds are normal.     Palpations: Abdomen is soft. There is no mass.     Tenderness: There is no abdominal tenderness. There is no guarding.  Musculoskeletal:        General: Normal range of motion.     Cervical back: Normal range of motion and neck supple.     Right lower leg: No edema.     Left lower leg: No edema.  Lymphadenopathy:     Cervical: No cervical adenopathy.  Skin:    General: Skin is warm and dry.  Neurological:     Mental Status: MARCELINO PELLECCHIA is alert.    Wt Readings from Last 3 Encounters:  06/04/21 (!) 319 lb (144.7 kg)  07/16/20 300 lb (136.1 kg)  03/19/20 296 lb (134.3 kg)    BP 120/88   Pulse 80   Ht '5\' 11"'$  (1.803 m)   Wt (!) 319 lb (144.7 kg)   BMI 44.49 kg/m   Assessment and Plan:  1. Chest pain, unspecified type New onset.  Described as tightness.  Episodic.  When occurs is with activity but patient has had significant exercise activity without pain as well.  Because of risk factors EKG was done.  EKG results:I  have reviewed EKG which shows rate 68 and sinus rhythm.  Intervals normal.  There is no EKG evidence of LVH by voltage.  There are no ischemic EKG changes such as Q waves, ST-T wave changes, nor delay in R wave progression.. Comparison to previous EKG dated none available for comparison.  Because of the risk factors and that this is occurred within the last couple of months we will refer to cardiology for further evaluation given that he also had a near syncopal episode with palpitations as noted below. X-ray will be obtained as well. - Comprehensive Metabolic Panel (CMET) - DG Chest 2 View; Future - Ambulatory referral to Cardiology  2. Palpitations New onset.  1 episode.  No persistent episodes thereafter but patient had with activity noted a run of tachyarrhythmia that is associated with dizziness and near syncope.  This was while patient was spreading mulch.  EKG as noted above is unremarkable for ischemic changes but we will refer to cardiology for further evaluation. - EKG 12-Lead - DG Chest 2 View; Future - Ambulatory referral to Cardiology  3. Post-COVID chronic dyspnea Patient had COVID several months ago and has developed some chronic dyspnea along with cough.  We will obtain a chest x-ray first and pending cardiology evaluation and x-ray consider next step as pulmonary consult.  4. Morbid obesity due to excess calories [name]) Health risks of being over weight were discussed and patient was counseled on weight loss options and exercise.  - Lipid Panel With LDL/HDL Ratio    5. Asthma, exercise induced Patient with history of asthma which is usually associated with exercise.  Patient has been increasing workouts in the gym as well as cardio and weight training.  Chest tightness may be associated with exacerbation of asthma and pending evaluation and we have discussed the possibility of a long-acting beta agonist along with his current use of immediate acting beta agonist.  Patient has  requested as his cardiologist of choice Dr Karren Burly in [place] and referral has been  placed.

## 2021-06-04 NOTE — Addendum Note (Signed)
Addended by: Juline Patch on: 06/04/2021 11:57 AM   Modules accepted: Orders

## 2021-06-05 ENCOUNTER — Emergency Department (HOSPITAL_COMMUNITY): Payer: 59

## 2021-06-05 ENCOUNTER — Encounter: Payer: Self-pay | Admitting: Family Medicine

## 2021-06-05 ENCOUNTER — Emergency Department (HOSPITAL_COMMUNITY)
Admission: EM | Admit: 2021-06-05 | Discharge: 2021-06-05 | Disposition: A | Discharge status: Home / Self Care | Payer: 59 | Attending: Emergency Medicine | Admitting: Emergency Medicine

## 2021-06-05 ENCOUNTER — Other Ambulatory Visit: Payer: Self-pay

## 2021-06-05 ENCOUNTER — Encounter (HOSPITAL_COMMUNITY): Payer: Self-pay

## 2021-06-05 DIAGNOSIS — F419 Anxiety disorder, unspecified: Secondary | ICD-10-CM

## 2021-06-05 DIAGNOSIS — J45909 Unspecified asthma, uncomplicated: Secondary | ICD-10-CM | POA: Diagnosis not present

## 2021-06-05 DIAGNOSIS — R0789 Other chest pain: Secondary | ICD-10-CM

## 2021-06-05 DIAGNOSIS — I10 Essential (primary) hypertension: Secondary | ICD-10-CM | POA: Diagnosis not present

## 2021-06-05 DIAGNOSIS — R002 Palpitations: Secondary | ICD-10-CM | POA: Diagnosis not present

## 2021-06-05 DIAGNOSIS — R42 Dizziness and giddiness: Secondary | ICD-10-CM | POA: Diagnosis not present

## 2021-06-05 LAB — CBC
HCT: 42.4 % (ref 39.0–52.0)
Hemoglobin: 13.6 g/dL (ref 13.0–17.0)
MCH: 27.7 pg (ref 26.0–34.0)
MCHC: 32.1 g/dL (ref 30.0–36.0)
MCV: 86.4 fL (ref 80.0–100.0)
Platelets: 218 10*3/uL (ref 150–400)
RBC: 4.91 MIL/uL (ref 4.22–5.81)
RDW: 13 % (ref 11.5–15.5)
WBC: 7 10*3/uL (ref 4.0–10.5)
nRBC: 0 % (ref 0.0–0.2)

## 2021-06-05 LAB — BASIC METABOLIC PANEL
Anion gap: 7 (ref 5–15)
BUN: 12 mg/dL (ref 6–20)
CO2: 25 mmol/L (ref 22–32)
Calcium: 9.1 mg/dL (ref 8.9–10.3)
Chloride: 105 mmol/L (ref 98–111)
Creatinine, Ser: 0.96 mg/dL (ref 0.61–1.24)
GFR, Estimated: >60 mL/min (ref >60)
Glucose, Bld: 100 mg/dL — ABNORMAL HIGH (ref 70–99)
Potassium: 3.8 mmol/L (ref 3.5–5.1)
Sodium: 137 mmol/L (ref 135–145)

## 2021-06-05 LAB — TROPONIN I (HIGH SENSITIVITY)
Troponin I (High Sensitivity): 4 ng/L (ref <18)
Troponin I (High Sensitivity): 4 ng/L (ref <18)

## 2021-06-05 LAB — MAGNESIUM: Magnesium: 2 mg/dL (ref 1.7–2.4)

## 2021-06-05 NOTE — Discharge Instructions (Addendum)
I reviewed your EKG and blood work along with the attending physician.  It is all very reassuring.  There is no signs of an arrhythmia, and your troponins are both normal which is very reassuring.  It does not seem like you are having a heart attack or any other cardiac or pulmonary event.  Please follow-up with your primary care doctor.  Do not hesitate to return to the emergency department if symptoms should significantly change or worsen.

## 2021-06-05 NOTE — ED Provider Notes (Signed)
[facility] EMERGENCY DEPARTMENT Provider Note   CSN: [id number] Arrival date & time: 06/05/21  1532     History Chief Complaint  Patient presents with   Anxiety   Palpitations    Alex [name] is a 40 y.o. adult.   Anxiety Pertinent negatives include no chest pain, no abdominal pain and no shortness of breath.  Palpitations Associated symptoms: no back pain, no chest pain, no cough, no shortness of breath and no vomiting    40 year old presenting to the emergency department with chest pain.  Patient reports that he was seen by his primary care doctor yesterday for PVCs.  Patient states that normally his primary care doctor told him that something could be wrong with his heart and he needed further work-up.  He states that ever since that time, he has been very concerned about his heart function.  He states that today since he woke up, he had some additional chest tightness.  Patient states he used his albuterol twice today, but that did not significantly help with symptoms.  He reports he also had some left-sided chest pain, sharp, transient, nonradiating.  He is chest tightness is not currently present.  His symptoms are not exertional, they are nonpleuritic.  No history of similar symptoms.  Patient states that he is concerned that his symptoms could be due to anxiety, as ever since his primary care doctor told him he could could have something wrong with his heart he has not been able to stop thinking about it.  Past Medical History:  Diagnosis Date   Anxiety    Asthma    exercise-induced   Depression     Patient Active Problem List   Diagnosis Date Noted   MDD (major depressive disorder), single episode, severe , no psychosis ([place])    MDD (major depressive disorder), recurrent severe, without psychosis ([place]) 12/13/2017   Morbid obesity due to excess calories ([place]) 03/04/2016   Essential hypertension 03/04/2016   Hypersomnia with sleep apnea 01/14/2016    Snoring 01/14/2016    History reviewed. No pertinent surgical history.     Family History  Problem Relation Age of Onset   Hypertension Father    Cancer Maternal Grandfather    Cancer Paternal Grandfather     Social History   Tobacco Use   Smoking status: Never   Smokeless tobacco: Never  Substance Use Topics   Alcohol use: Yes    Alcohol/week: 2.0 standard drinks    Types: 2 Standard drinks or equivalent per week    Comment: occasional   Drug use: No    Home Medications Prior to Admission medications   Medication Sig Start Date End Date Taking? Authorizing Provider  albuterol (VENTOLIN HFA) 108 (90 Base) MCG/ACT inhaler Inhale 1-2 puffs into the lungs every 6 (six) hours as needed for wheezing or shortness of breath. 1-2 puffs every 6 hours as needed 07/16/20   Juline Patch, MD  buPROPion (WELLBUTRIN XL) 300 MG 24 hr tablet TAKE 1 TABLET BY MOUTH DAILY 12/30/20 12/30/21  Corena Pilgrim, MD  busPIRone (BUSPAR) 15 MG tablet TAKE 1 TABLET BY MOUTH TWICE A DAY FOR ANXIETY 12/30/20 12/30/21  Corena Pilgrim, MD  hydrOXYzine (ATARAX/VISTARIL) 25 MG tablet Take 1 tablet (25 mg total) by mouth 3 (three) times daily as needed for anxiety. 12/16/17   Money, Lowry Ram, FNP    Allergies    Patient has no known allergies.  Review of Systems   Review of Systems  Constitutional:  Negative for chills and fever.  HENT:  Negative for ear pain and sore throat.   Eyes:  Negative for pain and visual disturbance.  Respiratory:  Positive for chest tightness. Negative for cough and shortness of breath.   Cardiovascular:  Positive for palpitations. Negative for chest pain.  Gastrointestinal:  Negative for abdominal pain and vomiting.  Genitourinary:  Negative for dysuria and hematuria.  Musculoskeletal:  Negative for arthralgias and back pain.  Skin:  Negative for color change and rash.  Neurological:  Negative for seizures and syncope.  All other systems reviewed and are  negative.  Physical Exam Updated Vital Signs BP 120/75   Pulse 69   Temp 98.3 F (36.8 C) (Oral)   Resp 14   Ht '5\' 10"'$  (1.778 m)   Wt (!) 140.6 kg   SpO2 100%   BMI 44.48 kg/m   Physical Exam Vitals and nursing note reviewed.  Constitutional:      General: THERAN COOLING is not in acute distress.    Appearance: Normal appearance. FERRON KARLE is well-developed and normal weight. RUPINDER THERRELL is not ill-appearing or toxic-appearing.  HENT:     Head: Normocephalic and atraumatic.  Eyes:     Conjunctiva/sclera: Conjunctivae normal.  Cardiovascular:     Rate and Rhythm: Normal rate and regular rhythm.     Heart sounds: No murmur heard. Pulmonary:     Effort: Pulmonary effort is normal. No respiratory distress.     Breath sounds: Normal breath sounds. No stridor. No wheezing, rhonchi or rales.  Abdominal:     Palpations: Abdomen is soft.     Tenderness: There is no abdominal tenderness.  Musculoskeletal:     Cervical back: Neck supple.  Skin:    General: Skin is warm and dry.     Capillary Refill: Capillary refill takes less than 2 seconds.  Neurological:     Mental Status: CORDELLE CHOUDHARY is alert and oriented to person, place, and time.    ED Results / Procedures / Treatments   Labs (all labs ordered are listed, but only abnormal results are displayed) Labs Reviewed  BASIC METABOLIC PANEL - Abnormal; Notable for the following components:      Result Value   Glucose, Bld 100 (*)    All other components within normal limits  CBC  MAGNESIUM  TROPONIN I (HIGH SENSITIVITY)  TROPONIN I (HIGH SENSITIVITY)    EKG EKG Interpretation  Date/Time:  Thursday June 05 2021 15:38:44 EDT Ventricular Rate:  73 PR Interval:  166 QRS Duration: 78 QT Interval:  356 QTC Calculation: 392 R Axis:   49 Text Interpretation: Normal sinus rhythm with sinus arrhythmia Normal ECG Confirmed by Noemi Chapel [phone]) on 06/05/2021 10:37:32 PM  Radiology DG Chest 2  View  Result Date: 06/05/2021 CLINICAL DATA:  Chest tightness. Dizziness. Anxiety. Palpitations. EXAM: CHEST - 2 VIEW COMPARISON:  06/04/2021 FINDINGS: Heart size is normal. Mediastinal shadows are normal. The lungs are clear. No bronchial thickening. No infiltrate, mass, effusion or collapse. Pulmonary vascularity is normal. No bony abnormality. IMPRESSION: Normal chest Electronically Signed   By: Nelson Chimes M.D.   On: 06/05/2021 16:44   DG Chest 2 View  Result Date: 06/05/2021 CLINICAL DATA:  cough/dyspnea/with exercise/ post covid EXAM: CHEST - 2 VIEW COMPARISON:  None. FINDINGS: The heart size and mediastinal contours are within normal limits. Both lungs are clear. The visualized skeletal structures are unremarkable. IMPRESSION: No active cardiopulmonary disease. Electronically Signed   By: Hart Carwin  Plundo D.O.   On: 06/05/2021 07:58    Procedures Procedures   Medications Ordered in ED Medications - No data to display  ED Course  I have reviewed the triage vital signs and the nursing notes.  Pertinent labs & imaging results that were available during my care of the patient were reviewed by me and considered in my medical decision making (see chart for details).    MDM Rules/Calculators/A&P HEAR Score: 1                         40 year old with asthma and anxiety presenting to the emergency department with atypical chest pain since yesterday.  Vital signs reviewed, within acceptable limits.  Physical exam shows a well-appearing patient, comfortable, no acute distress.  Lung sounds are clear, no wheezing.  Differential includes pneumonia, pneumothorax, ACS, pulmonary embolism.  Chest x-ray does not show any pneumothorax or pneumonia.  EKG shows normal sinus rhythm with a proper intervals, no anatomic ST elevation or ST depression that would be concerning for ACS.  Delta troponins negative.  Low risk hear score.  Patient has no leg swelling, no tachycardia, no hypoxia, no history of  thromboembolic disease, I have no concern for pulmonary embolism at this time.  Overall, patient's symptoms do not appear to be cardiac or pulmonary in etiology.  I believe he is safe for discharge from the emergency department at this time.  Patient is comfortable with the after mentioned reassuring work-up and comfortable going home.  Strict return precautions discussed with patient at bedside. Final Clinical Impression(s) / ED Diagnoses Final diagnoses:  Anxiety  Other chest pain    Rx / DC Orders ED Discharge Orders     None        Claud Kelp, MD 06/05/21 Oswaldo Done    Noemi Chapel, MD 06/06/21 [phone]

## 2021-06-05 NOTE — ED Provider Notes (Signed)
Emergency Medicine Provider Triage Evaluation Note  Alex [name] , a 40 y.o. adult  was evaluated in triage.  Pt complains of dizziness, chest tightness, and anxiety.  Patient reports that he woke this morning having palpitations and left-sided chest tightness.  He reports that he has been having the palpitations and chest tightness intermittently throughout the day.  Patient endorses associated shortness of breath and hyperventilation.  Patient states that when he is hyperventilating he becomes lightheaded but denies any loss of consciousness.  Patient states that he was recently seen by PCP for similar symptoms.  Patient denies any history of DVT/PE, surgery last 4 weeks, cancer treatment, hormone therapy, unilateral leg swelling or tenderness.  Review of Systems  Positive: Chest tightness, shortness of breath, palpitations, anxiety Negative: Diaphoresis, nausea, vomiting, abdominal pain, leg swelling or tenderness  Physical Exam  BP (!) 137/108   Pulse 81   Temp 99 F (37.2 C) (Oral)   Resp 16   Ht '5\' 10"'$  (1.778 m)   Wt (!) 140.6 kg   SpO2 100%   BMI 44.48 kg/m  Gen:   Awake, no distress, patient appears anxious Resp:  Normal effort, lungs clear to auscultation bilaterally MSK:   Moves extremities without difficulty, no swelling or edema noted to bilateral lower extremities Other:  Abdomen soft, nondistended, nontender, no mass, no pulsatile mass, no guarding, no rebound tenderness.  +2 radial pulse bilaterally  Medical Decision Making  Medically screening exam initiated at 3:50 PM.  Appropriate orders placed.  HELIOS GELTZ was informed that the remainder of the evaluation will be completed by another provider, this initial triage assessment does not replace that evaluation, and the importance of remaining in the ED until their evaluation is complete.  The patient appears stable so that the remainder of the work up may be completed by another provider.      Loni Beckwith, PA-C 06/05/21 1552    Truddie Hidden, MD 06/06/21 [phone]

## 2021-06-05 NOTE — ED Triage Notes (Addendum)
Pt states that he started having palpitations this morning upon waking. States that he used his inhaler to help with work of breathing this morning. Pt also states that he has been feeling increased anxiety throughout the day as the palpitations and diffuse chest tightness continue

## 2021-06-05 NOTE — ED Provider Notes (Signed)
I saw and evaluated the patient, reviewed the resident's note and I agree with the findings and plan.  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 -   Pertinent Exam findings: mild anxious appearing - VS normal  I was personally present and directly supervised the following procedures:  CP and medical eval Labs and EKG normal   EKG Interpretation  Date/Time:  Thursday June 05 2021 15:38:44 EDT Ventricular Rate:  73 PR Interval:  166 QRS Duration: 78 QT Interval:  356 QTC Calculation: 392 R Axis:   49 Text Interpretation: Normal sinus rhythm with sinus arrhythmia Normal ECG Confirmed by Noemi Chapel [phone]) on 06/05/2021 10:37:32 PM       I personally interpreted the EKG as well as the resident and agree with the interpretation on the resident's chart.  Final diagnoses:  Anxiety  Other chest pain      Noemi Chapel, MD 06/06/21 1514

## 2021-06-13 ENCOUNTER — Institutional Professional Consult (permissible substitution) (INDEPENDENT_AMBULATORY_CARE_PROVIDER_SITE_OTHER): Payer: 59 | Admitting: Thoracic Surgery (Cardiothoracic Vascular Surgery)

## 2021-06-13 ENCOUNTER — Telehealth: Payer: Self-pay

## 2021-06-13 ENCOUNTER — Encounter: Payer: Self-pay | Admitting: Thoracic Surgery (Cardiothoracic Vascular Surgery)

## 2021-06-13 ENCOUNTER — Other Ambulatory Visit: Payer: Self-pay | Admitting: Thoracic Surgery (Cardiothoracic Vascular Surgery)

## 2021-06-13 ENCOUNTER — Other Ambulatory Visit: Payer: Self-pay

## 2021-06-13 VITALS — BP 114/75 | HR 88 | Resp 20 | Ht 71.0 in | Wt 314.0 lb

## 2021-06-13 DIAGNOSIS — Z0000 Encounter for general adult medical examination without abnormal findings: Secondary | ICD-10-CM | POA: Diagnosis not present

## 2021-06-13 DIAGNOSIS — I10 Essential (primary) hypertension: Secondary | ICD-10-CM

## 2021-06-13 DIAGNOSIS — R002 Palpitations: Secondary | ICD-10-CM

## 2021-06-13 NOTE — Telephone Encounter (Signed)
-----   Message from Fonda Kinder sent at 06/13/2021 12:06 PM EDT ----- Regarding: Calcium score Good morning  I scanned in an order from Dr. Melodie Bouillon for this patient to have a calcium score while he is here for his Echo  Please enter order under Dr. Audie Box CT Calcium score Diagnosis Primary HTN  ICD 110  Once order is placed I will get it scheduled  Thanks, Lattie Haw

## 2021-06-13 NOTE — Telephone Encounter (Signed)
Order placed

## 2021-06-13 NOTE — Progress Notes (Signed)
[address]       [place] [address]             [phone]        Alex [name] [facility] Medical Record [id number] Date of Birth: 1981-01-01  Referring: Juline Patch, MD Primary Care: Juline Patch, MD Primary Cardiologist:None  Chief Complaint:    Chief Complaint  Patient presents with   Consult    History of Present Illness:     [name] is a 40 year old male who is self-referred for further evaluation of atypical chest pain.  He states that over the last several weeks he has been having a combination of pleuritic chest pain, chest wall pain, and radiating neck pain.  He was seen in the emergency department and was ruled out for myocardial infarction, but he also endorses a history of anxiety, and is concerned that he may have an underlying heart condition.  He does endorse some palpitations at rest.  Recently when he has been exercising that this has exacerbated some shortness of breath as well as sharp pain that appears focused on his chest wall.  He does have history of COVID over a year ago.  And several weeks prior to the onset of the symptoms he did have a positive COVID exposure, but tested negative for COVID-19.  At that point he did have fevers and a sore throat.   Past Medical History:  Diagnosis Date   Anxiety    Asthma    exercise-induced   Depression     No past surgical history on file.   Social History   Tobacco Use  Smoking Status Never  Smokeless Tobacco Never    Social History   Substance and Sexual Activity  Alcohol Use Yes   Alcohol/week: 2.0 standard drinks   Types: 2 Standard drinks or equivalent per week   Comment: occasional     No Known Allergies    Current Outpatient Medications  Medication Sig Dispense Refill   albuterol (VENTOLIN HFA) 108 (90 Base) MCG/ACT inhaler Inhale 1-2 puffs into the lungs every 6 (six) hours as needed for wheezing or shortness of breath. 1-2 puffs every 6 hours as  needed 6.7 g 11   buPROPion (WELLBUTRIN XL) 300 MG 24 hr tablet TAKE 1 TABLET BY MOUTH DAILY 30 tablet 3   busPIRone (BUSPAR) 15 MG tablet TAKE 1 TABLET BY MOUTH TWICE A DAY FOR ANXIETY 60 tablet 3   hydrOXYzine (ATARAX/VISTARIL) 25 MG tablet Take 1 tablet (25 mg total) by mouth 3 (three) times daily as needed for anxiety. 60 tablet 0   No current facility-administered medications for this visit.    (Not in a hospital admission)   Family History  Problem Relation Age of Onset   Hypertension Father    Cancer Maternal Grandfather    Cancer Paternal Grandfather      Review of Systems:   Review of Systems  Constitutional: Negative.   Respiratory:  Positive for shortness of breath.   Cardiovascular:  Positive for chest pain and palpitations.  Gastrointestinal:  Positive for abdominal pain.  Neurological:  Positive for sensory change and headaches.  Psychiatric/Behavioral:  Positive for depression. The patient is nervous/anxious.      Physical Exam: BP 114/75   Pulse 88   Resp 20   Ht '5\' 11"'$  (1.803 m)   Wt (!) 314 lb (142.4 kg)   SpO2 98% Comment: RA  BMI 43.79 kg/m  Physical Exam  Constitutional:      General: [name] is not in acute distress.    Appearance: Normal appearance. Alex [name] is not ill-appearing or toxic-appearing.  HENT:     Head: Normocephalic and atraumatic.  Eyes:     Extraocular Movements: Extraocular movements intact.  Cardiovascular:     Rate and Rhythm: Normal rate and regular rhythm.     Heart sounds: Normal heart sounds. No murmur heard. Pulmonary:     Effort: Pulmonary effort is normal. No respiratory distress.  Abdominal:     General: There is no distension.  Musculoskeletal:        General: Normal range of motion.     Cervical back: Normal range of motion.  Skin:    General: Skin is warm and dry.  Neurological:     General: No focal deficit present.     Mental Status: Alex [name] is alert and oriented to person, place,  and time.      Diagnostic Studies & Laboratory data:    I have independently reviewed the above radiologic studies and discussed with the patient   Recent Lab Findings: Lab Results  Component Value Date   WBC 7.0 06/05/2021   HGB 13.6 06/05/2021   HCT 42.4 06/05/2021   PLT 218 06/05/2021   GLUCOSE 100 (H) 06/05/2021   CHOL 169 03/19/2020   TRIG 71 03/19/2020   HDL 51 03/19/2020   LDLCALC 104 (H) 03/19/2020   ALT 25 03/19/2020   AST 25 03/19/2020   NA 137 06/05/2021   K 3.8 06/05/2021   CL 105 06/05/2021   CREATININE 0.96 06/05/2021   BUN 12 06/05/2021   CO2 25 06/05/2021   TSH 2.165 12/15/2017      Assessment / Plan:   40 year old male with atypical chest pain.  We had a long discussion in regards to possibility that this may be related to his anxiety.  Given his history of palpitations I have ordered an echocardiogram as well as a coronary calcium score CT scan.  I will see him as a virtual visit once all of these have resulted.     I  spent 20 minutes counseling the patient face to face.   Lajuana Matte 06/13/2021 4:41 PM

## 2021-06-14 LAB — COMPREHENSIVE METABOLIC PANEL
ALT: 33 IU/L (ref 0–44)
AST: 26 IU/L (ref 0–40)
Albumin/Globulin Ratio: 1.8 (ref 1.2–2.2)
Albumin: 4.4 g/dL (ref 4.0–5.0)
Alkaline Phosphatase: 78 IU/L (ref 44–121)
BUN/Creatinine Ratio: 10 (ref 9–20)
BUN: 11 mg/dL (ref 6–20)
Bilirubin Total: 0.4 mg/dL (ref 0.0–1.2)
CO2: 26 mmol/L (ref 20–29)
Calcium: 9.3 mg/dL (ref 8.7–10.2)
Chloride: 104 mmol/L (ref 96–106)
Creatinine, Ser: 1.06 mg/dL (ref 0.76–1.27)
Globulin, Total: 2.5 g/dL (ref 1.5–4.5)
Glucose: 91 mg/dL (ref 65–99)
Potassium: 4.9 mmol/L (ref 3.5–5.2)
Sodium: 141 mmol/L (ref 134–144)
Total Protein: 6.9 g/dL (ref 6.0–8.5)
eGFR: 93 mL/min/{1.73_m2} (ref >59)

## 2021-06-14 LAB — LIPID PANEL WITH LDL/HDL RATIO
Cholesterol, Total: 163 mg/dL (ref 100–199)
HDL: 48 mg/dL (ref >39)
LDL Chol Calc (NIH): 102 mg/dL — ABNORMAL HIGH (ref 0–99)
LDL/HDL Ratio: 2.1 ratio (ref 0.0–3.6)
Triglycerides: 69 mg/dL (ref 0–149)
VLDL Cholesterol Cal: 13 mg/dL (ref 5–40)

## 2021-06-16 ENCOUNTER — Ambulatory Visit (HOSPITAL_COMMUNITY)
Admission: RE | Admit: 2021-06-16 | Discharge: 2021-06-16 | Disposition: A | Discharge status: Home / Self Care | Payer: 59 | Source: Ambulatory Visit | Attending: Thoracic Surgery (Cardiothoracic Vascular Surgery) | Admitting: Thoracic Surgery (Cardiothoracic Vascular Surgery)

## 2021-06-16 ENCOUNTER — Ambulatory Visit (HOSPITAL_BASED_OUTPATIENT_CLINIC_OR_DEPARTMENT_OTHER): Payer: 59

## 2021-06-16 ENCOUNTER — Ambulatory Visit (INDEPENDENT_AMBULATORY_CARE_PROVIDER_SITE_OTHER)
Admission: RE | Admit: 2021-06-16 | Discharge: 2021-06-16 | Disposition: A | Discharge status: Home / Self Care | Payer: Self-pay | Source: Ambulatory Visit | Attending: Cardiovascular Disease | Admitting: Cardiovascular Disease

## 2021-06-16 ENCOUNTER — Other Ambulatory Visit: Payer: Self-pay

## 2021-06-16 ENCOUNTER — Other Ambulatory Visit: Payer: Self-pay | Admitting: *Deleted

## 2021-06-16 DIAGNOSIS — I10 Essential (primary) hypertension: Secondary | ICD-10-CM | POA: Diagnosis not present

## 2021-06-16 DIAGNOSIS — M7989 Other specified soft tissue disorders: Secondary | ICD-10-CM | POA: Diagnosis not present

## 2021-06-16 DIAGNOSIS — R0789 Other chest pain: Secondary | ICD-10-CM

## 2021-06-16 LAB — ECHOCARDIOGRAM COMPLETE
Area-P 1/2: 4.44 cm2
S' Lateral: 2.6 cm

## 2021-06-16 NOTE — Progress Notes (Signed)
Bilateral lower extremity venous duplex has been completed. Preliminary results can be found in CV Proc through chart review.  Results were given to Levonne Spiller RN.  06/16/21 3:46 PM Carlos Levering RVT

## 2021-06-17 ENCOUNTER — Encounter: Payer: Self-pay | Admitting: Family Medicine

## 2021-06-18 ENCOUNTER — Encounter: Payer: Self-pay | Admitting: Family Medicine

## 2021-06-18 ENCOUNTER — Ambulatory Visit (INDEPENDENT_AMBULATORY_CARE_PROVIDER_SITE_OTHER): Payer: 59 | Admitting: Family Medicine

## 2021-06-18 ENCOUNTER — Other Ambulatory Visit: Payer: Self-pay

## 2021-06-18 VITALS — BP 120/80 | HR 80 | Ht 71.0 in | Wt 317.0 lb

## 2021-06-18 DIAGNOSIS — Z1211 Encounter for screening for malignant neoplasm of colon: Secondary | ICD-10-CM | POA: Diagnosis not present

## 2021-06-18 DIAGNOSIS — Z0000 Encounter for general adult medical examination without abnormal findings: Secondary | ICD-10-CM | POA: Diagnosis not present

## 2021-06-18 LAB — HEMOCCULT GUIAC POC 1CARD (OFFICE): Fecal Occult Blood, POC: NEGATIVE

## 2021-06-18 NOTE — Progress Notes (Signed)
Date:  06/18/2021   Name:  Alex [name]   DOB:  1981/10/18   MRN:  [id number]   Chief Complaint: Annual Exam  Alex [name] is a 40 y.o. adult who presents today for Alex [name] Complete Annual Exam. Alex [name]. Alex [name] reports exercising daily. Alex [name].     Lab Results  Component Value Date   CREATININE 1.06 06/13/2021   BUN 11 06/13/2021   NA 141 06/13/2021   K 4.9 06/13/2021   CL 104 06/13/2021   CO2 26 06/13/2021   Lab Results  Component Value Date   CHOL 163 06/13/2021   HDL 48 06/13/2021   LDLCALC 102 (H) 06/13/2021   TRIG 69 06/13/2021   CHOLHDL 3.2 03/07/2019   Lab Results  Component Value Date   TSH 2.165 12/15/2017   No results found for: HGBA1C Lab Results  Component Value Date   WBC 7.0 06/05/2021   HGB 13.6 06/05/2021   HCT 42.4 06/05/2021   MCV 86.4 06/05/2021   PLT 218 06/05/2021   Lab Results  Component Value Date   ALT 33 06/13/2021   AST 26 06/13/2021   ALKPHOS 78 06/13/2021   BILITOT 0.4 06/13/2021     Review of Systems  Constitutional:  Positive for fatigue. Negative for chills and fever.  HENT:  Positive for nosebleeds. Negative for drooling, ear discharge, ear pain and sore throat.   Respiratory:  Positive for shortness of breath. Negative for cough, chest tightness and wheezing.   Cardiovascular:  Negative for chest pain, palpitations and leg swelling.  Gastrointestinal:  Negative for abdominal distention, abdominal pain, anal bleeding, blood in stool, constipation, diarrhea and nausea.       Hemorrhoids  Endocrine: Negative for polydipsia.  Genitourinary:  Negative for dysuria, frequency, hematuria and urgency.  Musculoskeletal:  Negative for arthralgias, back pain, myalgias and neck pain.  Skin:  Negative for color change, pallor and rash.  Allergic/Immunologic: Negative for environmental allergies.  Neurological:  Negative for dizziness and  headaches.  Hematological:  Negative for adenopathy. Does not bruise/bleed easily.  Psychiatric/Behavioral:  Negative for suicidal ideas. The patient is nervous/anxious.    Patient Active Problem List   Diagnosis Date Noted   MDD (major depressive disorder), single episode, severe , no psychosis ([place])    MDD (major depressive disorder), recurrent severe, without psychosis ([place]) 12/13/2017   Morbid obesity due to excess calories ([place]) 03/04/2016   Essential hypertension 03/04/2016   Hypersomnia with sleep apnea 01/14/2016   Snoring 01/14/2016    No Known Allergies  No past surgical history on file.  Social History   Tobacco Use   Smoking status: Never   Smokeless tobacco: Never  Substance Use Topics   Alcohol use: Yes    Alcohol/week: 2.0 standard drinks    Types: 2 Standard drinks or equivalent per week    Comment: occasional   Drug use: No     Medication list has been reviewed and updated.  Current Meds  Medication Sig   albuterol (VENTOLIN HFA) 108 (90 Base) MCG/ACT inhaler Inhale 1-2 puffs into the lungs every 6 (six) hours as needed for wheezing or shortness of breath. 1-2 puffs every 6 hours as needed   buPROPion (WELLBUTRIN XL) 300 MG 24 hr tablet TAKE 1 TABLET BY MOUTH DAILY   busPIRone (BUSPAR) 15 MG tablet TAKE 1 TABLET BY MOUTH TWICE A DAY FOR ANXIETY  hydrOXYzine (ATARAX/VISTARIL) 25 MG tablet Take 1 tablet (25 mg total) by mouth 3 (three) times daily as needed for anxiety.    PHQ 2/9 Scores 06/18/2021 06/04/2021 07/16/2020 03/19/2020  PHQ - 2 Score 0 0 0 0  PHQ- 9 Score 0 0 0 0    GAD 7 : Generalized Anxiety Score 06/18/2021 06/04/2021 07/16/2020 03/19/2020  Nervous, Anxious, on Edge 1 0 0 1  Control/stop worrying 0 1 0 1  Worry too much - different things 0 1 0 1  Trouble relaxing 0 1 0 1  Restless 0 0 0 1  Easily annoyed or irritable 0 1 0 0  Afraid - awful might happen 0 0 0 1  Total GAD 7 Score 1 4 0 6  Anxiety Difficulty Not difficult at all Not  difficult at all - Not difficult at all    BP Readings from Last 3 Encounters:  06/18/21 120/80  06/13/21 114/75  06/05/21 120/75    Physical Exam Vitals and nursing note reviewed. Exam conducted with a chaperone present.  Constitutional:      General: Alex [name] is not in acute distress.    Appearance: Normal appearance. Alex [name] is overweight. Alex [name] is not diaphoretic.  HENT:     Head: Normocephalic and atraumatic.     Jaw: There is normal jaw occlusion.     Right Ear: Hearing, tympanic membrane, ear canal and external ear normal.     Left Ear: Hearing, tympanic membrane, ear canal and external ear normal.     Nose: Nose normal.     Mouth/Throat:     Lips: Pink.     Mouth: Mucous membranes are moist.     Dentition: Normal dentition.     Tongue: No lesions.     Palate: No mass.     Pharynx: Oropharynx is clear. Uvula midline.  Eyes:     General: Lids are normal. Vision grossly intact. Gaze aligned appropriately.        Right eye: No discharge.        Left eye: No discharge.     Extraocular Movements: Extraocular movements intact.     Conjunctiva/sclera: Conjunctivae normal.     Pupils: Pupils are equal, round, and reactive to light.  Neck:     Thyroid: No thyroid mass, thyromegaly or thyroid tenderness.     Vascular: Normal carotid pulses. No carotid bruit, hepatojugular reflux or JVD.     Trachea: Trachea and phonation normal.  Cardiovascular:     Rate and Rhythm: Normal rate and regular rhythm.     Chest Wall: PMI is not displaced. No thrill.     Pulses: Normal pulses.          Carotid pulses are 2+ on the right side and 2+ on the left side.      Radial pulses are 2+ on the right side and 2+ on the left side.       Femoral pulses are 2+ on the right side and 2+ on the left side.      Popliteal pulses are 2+ on the right side and 2+ on the left side.       Dorsalis pedis pulses are 2+ on the right side and 2+ on the left side.       Posterior  tibial pulses are 2+ on the right side and 2+ on the left side.     Heart sounds: Normal heart sounds, S1 normal and S2 normal. No murmur heard.  No systolic murmur is present.  No diastolic murmur is present.    No friction rub. No gallop. No S3 or S4 sounds.  Pulmonary:     Effort: Pulmonary effort is normal.     Breath sounds: Normal breath sounds. No decreased breath sounds, wheezing, rhonchi or rales.  Chest:  Breasts:    Right: No swelling, bleeding, inverted nipple, mass, nipple discharge, skin change or tenderness.     Left: No swelling, bleeding, inverted nipple, mass, nipple discharge, skin change or tenderness.  Abdominal:     General: Bowel sounds are normal.     Palpations: Abdomen is soft. There is no hepatomegaly, splenomegaly or mass.     Tenderness: There is no abdominal tenderness. There is no guarding.     Hernia: No hernia is present. There is no hernia in the umbilical area, ventral area, left inguinal area or right inguinal area.  Genitourinary:    Penis: Normal and circumcised.      Testes: Normal.     Epididymis:     Right: Normal.     Left: Normal.     Prostate: Normal. Not enlarged, not tender and no nodules present.     Rectum: Normal. Guaiac result negative. No mass.  Musculoskeletal:        General: Normal range of motion.     Cervical back: Full passive range of motion without pain, normal range of motion and neck supple.     Right lower leg: No edema.     Left lower leg: No edema.  Lymphadenopathy:     Cervical: No cervical adenopathy.     Right cervical: No superficial, deep or posterior cervical adenopathy.    Left cervical: No superficial, deep or posterior cervical adenopathy.     Upper Body:     Right upper body: No supraclavicular or axillary adenopathy.     Left upper body: No supraclavicular or axillary adenopathy.     Lower Body: No right inguinal adenopathy. No left inguinal adenopathy.  Skin:    General: Skin is warm and dry.   Neurological:     Mental Status: MARWOOD CORKILL is alert.     Cranial Nerves: Cranial nerves are intact.     Sensory: Sensation is intact.     Motor: Motor function is intact.     Deep Tendon Reflexes: Reflexes are normal and symmetric.  Psychiatric:        Behavior: Behavior is cooperative.    Wt Readings from Last 3 Encounters:  06/18/21 (!) 317 lb (143.8 kg)  06/13/21 (!) 314 lb (142.4 kg)  06/05/21 (!) 310 lb (140.6 kg)    BP 120/80   Pulse 80   Ht '5\' 11"'$  (1.803 m)   Wt (!) 317 lb (143.8 kg)   BMI 44.21 kg/m   Assessment and Plan: CADARRIUS ANTIGUA is a 40 y.o. adult who presents today for FARDEEN TREASURE Complete Annual Exam. ERA REAUME feels [name]. Keldon Airey [facility] reports exercising as can. Alex [name] reports ANGUEL LEAMY is sleeping fairly [name].  Patient's chart was reviewed for previous encounters most recent labs most recent imaging and care everywhere. 1. Annual physical exam Immunizations are reviewed and recommendations provided.   Age appropriate screening tests are discussed. Counseling given for risk factor reduction interventions.  No subjective/objective concerns noted during history, review of systems, and physical exam. - POCT Occult Blood Stool  2. Colon cancer screening DRE was unremarkable and guaiac was negative. - POCT  Occult Blood Stool

## 2021-06-18 NOTE — Patient Instructions (Signed)

## 2021-06-20 ENCOUNTER — Other Ambulatory Visit: Payer: Self-pay

## 2021-06-20 ENCOUNTER — Telehealth (INDEPENDENT_AMBULATORY_CARE_PROVIDER_SITE_OTHER): Payer: 59 | Admitting: Thoracic Surgery (Cardiothoracic Vascular Surgery)

## 2021-06-20 DIAGNOSIS — R0789 Other chest pain: Secondary | ICD-10-CM

## 2021-06-20 NOTE — Progress Notes (Signed)
     [address]       [place], [address]             [phone]       Patient: Home Provider: Office Consent for Telemedicine visit obtained.  Today's visit was completed via a real-time telehealth (see specific modality noted below). The patient/authorized person provided oral consent at the time of the visit to engage in a telemedicine encounter with the present provider at [facility]. The patient/authorized person was informed of the potential benefits, limitations, and risks of telemedicine. The patient/authorized person expressed understanding that the laws that protect confidentiality also apply to telemedicine. The patient/authorized person acknowledged understanding that telemedicine does not provide emergency services and that he or she would need to call 911 or proceed to the nearest hospital for help if such a need arose.   Total time spent in the clinical discussion 10 minutes.  Telehealth Modality: Phone visit (audio only)  I had a telephone visit with [name].  He was being evaluated for atypical chest pain.  His echocardiogram and coronary calcium CT were essentially normal.  He also had bilateral lower extremity DVT ultrasounds which were also normal.  He agrees that most of his symptoms may be related to anxiety, and he has made an appointment to meet with a therapist.  From a surgical standpoint there is not much that I can offer him.  He will follow-up as needed.

## 2021-06-25 ENCOUNTER — Encounter (INDEPENDENT_AMBULATORY_CARE_PROVIDER_SITE_OTHER): Payer: Self-pay | Admitting: Family Medicine

## 2021-06-25 ENCOUNTER — Other Ambulatory Visit: Payer: Self-pay

## 2021-06-25 ENCOUNTER — Ambulatory Visit (INDEPENDENT_AMBULATORY_CARE_PROVIDER_SITE_OTHER): Payer: 59 | Admitting: Family Medicine

## 2021-06-25 VITALS — BP 112/70 | HR 78 | Temp 98.3°F | Ht 70.0 in | Wt 311.0 lb

## 2021-06-25 DIAGNOSIS — F419 Anxiety disorder, unspecified: Secondary | ICD-10-CM | POA: Diagnosis not present

## 2021-06-25 DIAGNOSIS — F32A Depression, unspecified: Secondary | ICD-10-CM | POA: Diagnosis not present

## 2021-06-25 DIAGNOSIS — Z1331 Encounter for screening for depression: Secondary | ICD-10-CM

## 2021-06-25 DIAGNOSIS — R739 Hyperglycemia, unspecified: Secondary | ICD-10-CM

## 2021-06-25 DIAGNOSIS — Z6841 Body Mass Index (BMI) 40.0 and over, adult: Secondary | ICD-10-CM | POA: Diagnosis not present

## 2021-06-25 DIAGNOSIS — Z0289 Encounter for other administrative examinations: Secondary | ICD-10-CM

## 2021-06-25 DIAGNOSIS — Z9189 Other specified personal risk factors, not elsewhere classified: Secondary | ICD-10-CM

## 2021-06-25 DIAGNOSIS — R5383 Other fatigue: Secondary | ICD-10-CM

## 2021-06-25 DIAGNOSIS — J4520 Mild intermittent asthma, uncomplicated: Secondary | ICD-10-CM

## 2021-06-25 DIAGNOSIS — R0602 Shortness of breath: Secondary | ICD-10-CM

## 2021-06-25 DIAGNOSIS — E559 Vitamin D deficiency, unspecified: Secondary | ICD-10-CM | POA: Diagnosis not present

## 2021-06-25 NOTE — Progress Notes (Signed)
Chief Complaint:   Alex [name] (MR# [id number]) is a 40 y.o. adult who presents for evaluation and treatment of Alex and related comorbidities. Current BMI is Body mass index is 44.62 kg/m. Alex [name] has been struggling with Alex [name] weight for many years and has been unsuccessful in either losing weight, maintaining weight loss, or reaching Alex [name] healthy weight goal.  Alex [name] is currently in the action stage of change and ready to dedicate time achieving and maintaining a healthier weight. Alex [name] is interested in becoming our patient and working on intensive lifestyle modifications including (but not limited to) diet and exercise for weight loss.  Alex [name]'s habits were reviewed today and are as follows: Alex [name] family eats meals together, Alex [name] thinks Alex [name] family will eat healthier with Alex [name], Alex [name] struggles with family and or coworkers weight loss sabotage, Alex [name] desired weight loss is 61 lbs, Alex [name] has been heavy most of Alex [name] life, Alex [name] started gaining weight very early, as family has terrible habits, Alex [name] heaviest weight ever was 330 pounds, Alex [name] has significant food cravings issues, Alex [name] snacks frequently in the evenings, Alex [name] skips meals frequently, Alex [name] is frequently drinking liquids with calories, Alex [name] frequently makes poor food choices, Alex [name] has problems with excessive hunger, Alex [name] frequently eats larger portions than normal, and Alex [name] struggles with emotional eating.  Depression Screen Alex [name] (modified PHQ-9) score was 22.  Depression screen [facility] 2/9 06/25/2021  Decreased Interest 2  Down, Depressed, Hopeless 3  PHQ - 2 Score 5  Altered sleeping 3  Tired, decreased energy 3  Change in appetite 3  Feeling bad or failure about  yourself  3  Trouble concentrating 3  Moving slowly or fidgety/restless 1  Suicidal thoughts 1  PHQ-9 Score 22  Difficult doing work/chores -   Subjective:   1. Other fatigue Alex [name] admits to daytime somnolence and admits to waking up still tired. Patent has a history of symptoms of daytime fatigue. Alex [name] generally gets 5 or 7 hours of sleep per night, and states that Alex [name] has difficulty falling asleep. Snoring is present. Apneic episodes are present. Epworth Sleepiness Score is 5.  2. Mild intermittent asthma, unspecified whether complicated Alex [name] has a history of asthma. He is not on a controlled medications, and he has had no recent exacerbations.  3. Vitamin D deficiency Alex [name] is on multivitamins, and he notes fatigue. He has no recent Vit D levels.  4. Hyperglycemia Alex [name] has a history of some mildly elevated glucose readings, and he notes polyphagia.  5. Anxiety and depression Alex [name] is having treatment by his Psychiatrist. He notes emotional eating behaviors, and some issues with self-esteem.  6. At risk for impaired metabolic function Alex [name] is at increased risk for impaired metabolic function if nutrition is inadequate.  Assessment/Plan:   1. Other fatigue Alex [name] does feel that Alex Alex [name] weight is causing Alex [name] energy to be lower than it should be. Fatigue may be related to Alex, depression or many other causes. Labs will be ordered, and in the meanwhile, Alex [name] will focus on self care including making healthy food choices, increasing physical activity and focusing on stress reduction.  - Vitamin B12 - TSH - Folate - T3 - CBC With Differential - T4, free  2. Mild intermittent asthma, unspecified whether complicated Alex [name] is to work on weight loss, as this can improve asthma symptoms and reduce exacerbations.  3. Vitamin D deficiency Low Vitamin D level contributes to fatigue and are associated with Alex, breast,  and colon cancer. We will check labs today. Alex [name] will follow-up for routine testing of Vitamin D, at least 2-3 times per year to avoid over-replacement.  - VITAMIN D 25 Hydroxy (Vit-D Deficiency, Fractures)  4. Hyperglycemia Fasting labs will be obtained today, and results with be discussed with Alex [name] in 2 weeks at [name] follow up visit. In the meanwhile Alex [name] will start his eating plan and will work on weight loss efforts.  - Insulin, random - Comprehensive metabolic panel - Hemoglobin A1c  5. Anxiety and depression Behavior modification techniques were discussed today to help [name] deal with Alex [name] anxiety and depression. We will refer to Alex [name], our Bariatric Psychologist for evaluation. Orders and follow up as documented in patient record.   6. At risk for impaired metabolic function Alex [name] was given approximately 30 minutes of impaired  metabolic function prevention counseling today. We discussed intensive lifestyle modifications today with an emphasis on specific nutrition and exercise instructions and strategies.   Repetitive spaced learning was employed today to elicit superior memory formation and behavioral change.  7. Alex with current BMI 44.7 Alex [name] is currently in the action stage of change and Alex [name] goal is to continue with weight loss efforts. I recommend Alex [name] begin the structured treatment plan as follows:  Alex [name] has agreed to the Category 3 Plan.  Exercise goals: No exercise has been prescribed at this time.   Behavioral modification strategies: increasing lean protein intake and no skipping meals.  Alex [name] was informed of the importance of frequent follow-up visits to maximize Alex [name] success with intensive lifestyle modifications for Alex [name] multiple health conditions. Alex [name] was informed we would discuss Alex [name] lab results at Alex [name] next visit  unless there is a critical issue that needs to be addressed sooner. Alex [name] agreed to keep Alex [name]'s next visit at the agreed upon time to discuss these results.  Objective:   Blood pressure 112/70, pulse 78, temperature 98.3 F (36.8 C), height '5\' 10"'$  (1.778 m), weight (!) 311 lb (141.1 kg), SpO2 97 %. Body mass index is 44.62 kg/m.  EKG: Normal sinus rhythm, rate 73 BPM.  Indirect Calorimeter completed today shows a VO2 of 295 and a REE of 2030.  Alex Alex Schwalb's calculated basal metabolic rate is [id number] thus Donney Kallio Cuffie's basal metabolic rate is worse than expected.  General: Cooperative, alert, well developed, in no acute distress. HEENT: Conjunctivae and lids unremarkable. Cardiovascular: Regular rhythm.  Lungs: Normal work of breathing. Neurologic: No focal deficits.   Lab Results  Component Value Date   CREATININE 1.06 06/13/2021   BUN 11 06/13/2021   NA 141 06/13/2021   K 4.9 06/13/2021   CL 104 06/13/2021   CO2 26 06/13/2021   Lab Results  Component Value Date   ALT 33 06/13/2021   AST 26 06/13/2021   ALKPHOS 78 06/13/2021   BILITOT 0.4 06/13/2021   No results found for: HGBA1C No results found for: INSULIN Lab Results  Component Value Date   TSH 2.165 12/15/2017   Lab Results  Component Value Date   CHOL 163 06/13/2021   HDL 48 06/13/2021   LDLCALC 102 (H) 06/13/2021  TRIG 69 06/13/2021   CHOLHDL 3.2 03/07/2019   Lab Results  Component Value Date   WBC 7.0 06/05/2021   HGB 13.6 06/05/2021   HCT 42.4 06/05/2021   MCV 86.4 06/05/2021   PLT 218 06/05/2021   No results found for: IRON, TIBC, FERRITIN  Attestation Statements:   Reviewed by clinician on day of visit: allergies, medications, problem list, medical history, surgical history, family history, social history, and previous encounter notes.   I, Trixie Dredge, am acting as transcriptionist for [name], MD.  I have reviewed the above documentation for accuracy and  completeness, and I agree with the above. - [name], MD

## 2021-06-26 ENCOUNTER — Encounter (INDEPENDENT_AMBULATORY_CARE_PROVIDER_SITE_OTHER): Payer: Self-pay | Admitting: Family Medicine

## 2021-06-26 LAB — CBC WITH DIFFERENTIAL
Basophils Absolute: 0 10*3/uL (ref 0.0–0.2)
Basos: 0 %
EOS (ABSOLUTE): 0.1 10*3/uL (ref 0.0–0.4)
Eos: 2 %
Hematocrit: 43.5 % (ref 37.5–51.0)
Hemoglobin: 14.3 g/dL (ref 13.0–17.7)
Immature Grans (Abs): 0 10*3/uL (ref 0.0–0.1)
Immature Granulocytes: 1 %
Lymphocytes Absolute: 1.7 10*3/uL (ref 0.7–3.1)
Lymphs: 30 %
MCH: 27.8 pg (ref 26.6–33.0)
MCHC: 32.9 g/dL (ref 31.5–35.7)
MCV: 85 fL (ref 79–97)
Monocytes Absolute: 0.5 10*3/uL (ref 0.1–0.9)
Monocytes: 9 %
Neutrophils Absolute: 3.4 10*3/uL (ref 1.4–7.0)
Neutrophils: 58 %
RBC: 5.15 x10E6/uL (ref 4.14–5.80)
RDW: 12.6 % (ref 11.6–15.4)
WBC: 5.8 10*3/uL (ref 3.4–10.8)

## 2021-06-26 LAB — INSULIN, RANDOM: INSULIN: 6.5 u[IU]/mL (ref 2.6–24.9)

## 2021-06-26 LAB — COMPREHENSIVE METABOLIC PANEL
ALT: 36 IU/L (ref 0–44)
AST: 28 IU/L (ref 0–40)
Albumin/Globulin Ratio: 1.9 (ref 1.2–2.2)
Albumin: 4.4 g/dL (ref 4.0–5.0)
Alkaline Phosphatase: 82 IU/L (ref 44–121)
BUN/Creatinine Ratio: 15 (ref 9–20)
BUN: 13 mg/dL (ref 6–20)
Bilirubin Total: 0.5 mg/dL (ref 0.0–1.2)
CO2: 18 mmol/L — ABNORMAL LOW (ref 20–29)
Calcium: 9.2 mg/dL (ref 8.7–10.2)
Chloride: 103 mmol/L (ref 96–106)
Creatinine, Ser: 0.87 mg/dL (ref 0.76–1.27)
Globulin, Total: 2.3 g/dL (ref 1.5–4.5)
Glucose: 82 mg/dL (ref 65–99)
Potassium: 4.7 mmol/L (ref 3.5–5.2)
Sodium: 140 mmol/L (ref 134–144)
Total Protein: 6.7 g/dL (ref 6.0–8.5)
eGFR: 114 mL/min/{1.73_m2} (ref >59)

## 2021-06-26 LAB — VITAMIN D 25 HYDROXY (VIT D DEFICIENCY, FRACTURES): Vit D, 25-Hydroxy: 33 ng/mL (ref 30.0–100.0)

## 2021-06-26 LAB — T4, FREE: Free T4: 1.32 ng/dL (ref 0.82–1.77)

## 2021-06-26 LAB — HEMOGLOBIN A1C
Est. average glucose Bld gHb Est-mCnc: 97 mg/dL
Hgb A1c MFr Bld: 5 % (ref 4.8–5.6)

## 2021-06-26 LAB — FOLATE: Folate: >20 ng/mL (ref >3.0)

## 2021-06-26 LAB — VITAMIN B12: Vitamin B-12: 445 pg/mL (ref 232–1245)

## 2021-06-26 LAB — TSH: TSH: 1.82 u[IU]/mL (ref 0.450–4.500)

## 2021-06-26 LAB — T3: T3, Total: 134 ng/dL (ref 71–180)

## 2021-06-26 NOTE — Telephone Encounter (Signed)
Last OV with Dr. Beasley 

## 2021-07-08 NOTE — Progress Notes (Signed)
Office: [phone]  /  Fax: [phone]    Date: July 22, 2021   Appointment Start Time: 9:02am Duration: 59 minutes Provider: Glennie Isle, Psy.D. Type of Session: Intake for Individual Therapy  Location of Patient: Home Location of Provider: Provider's home (private office) Type of Contact: Telepsychological Visit via MyChart Video Visit  Informed Consent: Prior to proceeding with today's appointment, two pieces of identifying information were obtained. In addition, Cache's physical location at the time of this appointment was obtained as well a phone number he could be reached at in the event of technical difficulties. [name] and this provider participated in today's telepsychological service.   The provider's role was explained to [employer]. The provider reviewed and discussed issues of confidentiality, privacy, and limits therein (e.g., reporting obligations). In addition to verbal informed consent, written informed consent for psychological services was obtained prior to the initial appointment. Since the clinic is not a 24/7 crisis center, mental health emergency resources were shared and this  provider explained MyChart, e-mail, voicemail, and/or other messaging systems should be utilized only for non-emergency reasons. This provider also explained that information obtained during appointments will be placed in Love's medical record and relevant information will be shared with other providers at [facility] for coordination of care. Tayson agreed information may be shared with other [facility] providers as needed for coordination of care and by signing the service agreement document, he provided written consent for coordination of care. Prior to initiating telepsychological services, Coady completed an informed consent document, which included the development of a safety plan (i.e., an emergency contact and emergency resources) in the  event of an emergency/crisis. Quintin verbally acknowledged understanding he is ultimately responsible for understanding his insurance benefits for telepsychological and in-person services. This provider also reviewed confidentiality, as it relates to telepsychological services, as well as the rationale for telepsychological services (i.e., to reduce exposure risk to COVID-19). Koa  acknowledged understanding that appointments cannot be recorded without both party consent and he is aware he is responsible for securing confidentiality on his end of the session. Saleem verbally consented to proceed.  Chief Complaint/HPI: Jonathyn was referred by Dr. [name] due to  anxiety and depression . Per the note for the initial visit with Dr. [name] on June 25, 2021, "Lovie is having treatment by his Psychiatrist. He notes emotional eating behaviors, and some issues with self-esteem." The note for the initial appointment with Dr. [name] further indicated the following: "[name] family eats meals together, DUVAN MOUSEL thinks VENICE LIZ family will eat healthier with [name], [name] struggles with family and or coworkers weight loss sabotage, VENCENT HAUSCHILD desired weight loss is 61 lbs, IVAL PACER has been heavy most of JOIE REAMER life, IOAN LANDINI started gaining weight very early, as family has terrible habits, KERN GINGRAS heaviest weight ever was 330 pounds, MICIAH COVELLI has significant food cravings issues, KOU GUCCIARDO snacks frequently in the evenings, JALEIL RENWICK skips meals frequently, MABEL UNREIN is frequently drinking liquids with calories, KEILAN NICHOL frequently makes poor food choices, KRIS NO has problems with excessive hunger, MITUL HALLOWELL frequently eats larger portions than normal, and KAINEN STRUCKMAN struggles with emotional eating." Joshawa's Food and Mood (modified PHQ-9) score on June 25, 2021 was 22.  During today's appointment, Kodie reported, "I have an issue with stress eating and turning  to food for comfort." He acknowledged he engages in mindless eating behaviors, adding he is "working on that." He was verbally administered a questionnaire assessing various behaviors related to emotional eating behaviors. Kahil endorsed the following: overeat when you are celebrating, experience food cravings on a regular basis, use food to help you cope with emotional situations, find food is comforting to you, overeat when you are worried about something, not worry about what you eat when you are in a good mood, overeat when you are alone, but eat much less when you are with other people, and eat as a reward. He shared he craves sweets at night and crunchy foods during the day. Pau believes the onset of emotional eating behaviors was likely in childhood and described the current frequency of emotional eating behaviors as "two to three times a week." In addition, Kamali denied a history of binge eating behaviors. Konrad denied a history of purging for weight loss. He acknowledged when he would previously overeat, he would "deprive" himself and engage in physical activity secondary to "extreme guilt." He denied the deprivation as not being significant restriction, noting he would consume "the bare minimum." Christoffer reported the last time the aforementioned occurred was 5-10 years ago. He stated he has never been diagnosed with an eating disorder and never treated for emotional eating. Currently, Jarman indicated stress, depression, and anxiety triggers emotional eating behaviors, whereas having a "consistent, normal workout routine" makes emotional eating behaviors better. Furthermore, Brenden reported ongoing "high stress" due to work.   Mental Status Examination:  Appearance: well groomed and appropriate hygiene  Behavior: appropriate to circumstances Mood: euthymic Affect: mood  congruent Speech: normal in rate, volume, and tone Eye Contact: appropriate Psychomotor Activity: appropriate Gait: unable to assess  Thought Process: linear, logical, and goal directed  Thought Content/Perception: denies suicidal and homicidal ideation, plan, and intent, no hallucinations, delusions, bizarre thinking or behavior reported or observed, and denies ideation and engagement in self-injurious behaviors Orientation: time, person, place, and purpose of appointment Memory/Concentration: memory, attention, language, and fund of knowledge intact  Insight/Judgment: good  Family & Psychosocial History: Jovian reported he has been married for 14 years, and they have a daughter (age 20). 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 Currently, Aydien's social support system consists of his wife ("She is the rock.") and 2-3 college friends. Moreover, Efrain stated he resides with his wife and daughter.   Medical History:  Past Medical History:  Diagnosis Date   Anxiety    Arterial embolism of left leg (HCC)    Asthma    exercise-induced   Constipation    Depression    Infertility male    Palpitation    SOB (shortness of breath)    Vitamin D deficiency    No past surgical history on file. Current Outpatient Medications on File Prior to Visit  Medication Sig Dispense Refill   albuterol (VENTOLIN HFA) 108 (90 Base) MCG/ACT inhaler Inhale 1-2 puffs into the lungs every 6 (six) hours as needed for wheezing or shortness of breath. 1-2 puffs every 6 hours as needed 6.7 g 11   buPROPion (WELLBUTRIN XL) 300 MG 24 hr tablet TAKE 1 TABLET BY MOUTH DAILY 30 tablet 3   buPROPion (WELLBUTRIN XL) 300 MG 24 hr tablet Take 1 tablet by mouth daily 30  tablet 2   buPROPion (WELLBUTRIN XL) 300 MG 24 hr  tablet Take 1 tablet (300 mg total) by mouth daily. 30 tablet 2   busPIRone (BUSPAR) 15 MG tablet TAKE 1 TABLET BY MOUTH TWICE A DAY FOR ANXIETY 60 tablet 3   busPIRone (BUSPAR) 15 MG tablet Take 1 tablet by mouth  three times a day for anxiety 90 tablet 2   busPIRone (BUSPAR) 15 MG tablet Take 1 tablet (15 mg total) by mouth 3 (three) times daily for anxiety 90 tablet 2   Fexofenadine HCl (ALLEGRA PO) Take by mouth.     hydrOXYzine (ATARAX/VISTARIL) 25 MG tablet Take 1 tablet (25 mg total) by mouth 3 (three) times daily as needed for anxiety. 60 tablet 0   hydrOXYzine (ATARAX/VISTARIL) 25 MG tablet Take 1 tablet by mouth once daily as needed for anxiety and 2 tabs at bedtime as needed for sleep. 90 tablet 2   hydrOXYzine (ATARAX/VISTARIL) 25 MG tablet Take 1 tablet (25 mg total) by mouth daily as needed for anxiety. May also take 2 tablets (50 mg total) at bedtime as needed for sleep 90 tablet 2   Multiple Vitamin (MULTIVITAMIN) tablet Take 1 tablet by mouth daily.     Probiotic Product (PROBIOTIC DAILY PO) Take by mouth.     Vitamin D, Ergocalciferol, (DRISDOL) 1.25 MG (50000 UNIT) CAPS capsule Take 1 capsule (50,000 Units total) by mouth every 7 (seven) days. 4 capsule 0   No current facility-administered medications on file prior to visit.  Medication compliant; no concerns.   Mental Health History: Makye reported he attended therapeutic services via [facility] in 2018. He shared he "voluntary committed [himself] for three days" due to depression in 2019, noting it was likely postpartum depression and challenges having his medications evaluated. He added, "I need to do something to take care of myself and not get to that place again." Currently, Montavious reported he is receiving therapeutic services via [place] which is offered through [facility]. He indicated they have met once, noting their next appointment will be scheduled soon. However, he added they message frequently.  Ibrahim reported he sought services to address ongoing stressors. Derik reported he currently meets with Dr. Darleene Cleaver (psychiatrist) for medication management "once every six months." Their next appointment is at the beginning of November. Moreover, Yates reported a family history of anxiety and depression, noting family members typically will not seek services. Furthermore, Eston denied a history of physical and sexual abuse as well as neglect. [name] indicated he feels he experienced "childhood emotional abuse" due to his parents' own upbringings causing them to "lash out" at him. Jarrad disclosed recalling specific incidents (e.g., tray throwing). He further described his mother as "the Schaffer of guilt." Currently, Merritt described his relationship with his parents as challenging, noting "anger" has subsided. No safety concerns reported.   Maveryk reported he experienced passive suicidal ideation ("I don't want to exist. This feeling sucks so bad.") in 2019, which led to the hospitalization noted above. He denied experiencing suicidal plan and intent. He disclosed experiencing passive suicidal ideation in childhood, noting the "magnitude" [of the suicidal ideation] was "less." He denied experiencing suicidal plan and intent back then. He believes he experienced passive suicidal ideation in childhood due to feeling "stuck" and his parents being "super controlling." He last experienced suicidal ideation in 2019. The following protective factors were identified for Edie: daughter, desire to improve health, desire to travel and current pursuit of higher education. If he were  to become overwhelmed in the future, which is a sign that a crisis may occur, he identified the following coping skills he could engage in: kayaking, fishing, listening to an audiobook, taking time for self, being productive, going for walks in a park and spending time with family. It was recommended the aforementioned be written  down and developed into a coping card for future reference; he agreed. Psychoeducation regarding the importance of reaching out to a trusted individual and/or utilizing emergency resources if there is a change in emotional status and/or there is an inability to ensure safety was provided. Damare's confidence in reaching out to a trusted individual and/or utilizing emergency resources should there be an intensification in emotional status and/or there is an inability to ensure safety was assessed on a scale of one to ten where one is not confident and ten is extremely confident. He reported his confidence is a 10. Additionally, Viyan reported he owns small pocket knives, and agreed to relocate them if there were any concerns for safety.   Kaci described his typical mood lately as "anxious," noting improvement with mental health services. However, he noted, "I've always been kind of depressed." Due to ongoing work related stress, he shared he is currently considering other employment opportunities. He also discussed experiencing a panic attack three weeks ago resulting in an evaluation/tests at the ER as he was experiencing chest pain. There are no medical concerns related the aforementioned. Olivier further shared he experiences crying spells once a month and believes the childhood psychological abuse has contributed to him feeling he is a "people pleaser." Additionally, he stated he is "easily distractible" and can experience "hyperfixation" on tasks. During childhood, Yavier reported a belief he experienced visual hallucinations of the devil following vivid nightmares about the devil. He denied ever experiencing hallucinations again.   Pericles reported he currently consumes approximately three standard alcoholic beverages a month. He denied tobacco use. He also denied illicit/recreational substance use. Regarding caffeine intake, Witt reported consuming approximately 20oz of coffee daily and  approximately 32oz of soda daily. Furthermore, [name] indicated he is not experiencing the following: hallucinations and delusions, paranoia, symptoms of mania , social withdrawal, symptoms of trauma, and obsessions and compulsions. He also denied current suicidal ideation, plan, and intent; history of and current homicidal ideation, plan, and intent; and history of and current engagement in self-harm. Notably, Donevan endorsed item 9 (i.e., "Do you feel that your weight problem is so hopeless that sometimes life doesn't seem worth living?") on the modified PHQ-9 during his initial appointment with Dr. [name] on June 25, 2021. He clarified he endorsed the item due to being stuck with his weight loss and not due to suicidal ideation.   Legal History: Echo reported there is no history of legal involvement.   Structured Assessments Results: The Patient Health Questionnaire-9 (PHQ-9) is a self-report measure that assesses symptoms and severity of depression over the course of the last two weeks. Kinneth obtained a score of 6 suggesting mild depression. Dearion finds the endorsed symptoms to be somewhat difficult. [0= Not at all; 1= Several days; 2= More than half the days; 3= Nearly every day] Little interest or pleasure in doing things 0  Feeling down, depressed, or hopeless 1  Trouble falling or staying asleep, or sleeping too much 2  Feeling tired or having little energy 2  Poor appetite or overeating 0  Feeling bad about yourself --- or that you are a failure or have let yourself or your family  down 0  Trouble concentrating on things, such as reading the newspaper or watching television 1  Moving or speaking so slowly that other people could have noticed? Or the opposite --- being so fidgety or restless that you have been moving around a lot more than usual 0  Thoughts that you would be better off dead or hurting yourself in some way 0  PHQ-9 Score 6    The Generalized Anxiety  Disorder-7 (GAD-7) is a brief self-report measure that assesses symptoms of anxiety over the course of the last two weeks. Lindon obtained a score of 15 suggesting severe anxiety. Aleksei finds the endorsed symptoms to be somewhat difficult. [0= Not at all; 1= Several days; 2= Over half the days; 3= Nearly every day] Feeling nervous, anxious, on edge 2  Not being able to stop or control worrying 2  Worrying too much about different things 2  Trouble relaxing 3  Being so restless that it's hard to sit still 1  Becoming easily annoyed or irritable 2  Feeling afraid as if something awful might happen 3  GAD-7 Score 15   Interventions:  Conducted a chart review Focused on rapport building Verbally administered PHQ-9 and GAD-7 for symptom monitoring Verbally administered Food & Mood questionnaire to assess various behaviors related to emotional eating Provided emphatic reflections and validation Collaborated with patient on a treatment goal  Psychoeducation provided regarding physical versus emotional hunger Conducted a risk assessment Developed a coping card  Provisional DSM-5 Diagnosis(es): F32.89 Other Specified Depressive Disorder, Emotional Eating Behaviors and F41.9 Unspecified Anxiety Disorder  Plan: Daryus was encouraged to continue therapeutic and psychiatric services. It was recommended he meet with this provider to address eating related concerns; he was receptive. He appears able and willing to participate as evidenced by collaboration on a treatment goal, engagement in reciprocal conversation, and asking questions as needed for clarification. The next appointment will be scheduled in three weeks, which will be via MyChart Video Visit. The following treatment goal was established: increase coping skills. This provider will regularly review the treatment plan and medical chart to keep informed of status changes. Aydyn expressed understanding and agreement with the initial treatment  plan of care. Brysun will be sent a handout via e-mail to utilize between now and the next appointment to increase awareness of hunger patterns and subsequent eating. [name] provided verbal consent during today's appointment for this provider to send the handout via e-mail.

## 2021-07-09 ENCOUNTER — Other Ambulatory Visit (HOSPITAL_COMMUNITY): Payer: Self-pay

## 2021-07-09 ENCOUNTER — Ambulatory Visit (INDEPENDENT_AMBULATORY_CARE_PROVIDER_SITE_OTHER): Payer: 59 | Admitting: Family Medicine

## 2021-07-09 ENCOUNTER — Encounter (INDEPENDENT_AMBULATORY_CARE_PROVIDER_SITE_OTHER): Payer: Self-pay | Admitting: Family Medicine

## 2021-07-09 ENCOUNTER — Other Ambulatory Visit: Payer: Self-pay

## 2021-07-09 VITALS — BP 103/60 | HR 82 | Temp 98.7°F | Ht 70.0 in | Wt 305.0 lb

## 2021-07-09 DIAGNOSIS — E66813 Obesity, class 3: Secondary | ICD-10-CM

## 2021-07-09 DIAGNOSIS — Z9189 Other specified personal risk factors, not elsewhere classified: Secondary | ICD-10-CM

## 2021-07-09 DIAGNOSIS — J45990 Exercise induced bronchospasm: Secondary | ICD-10-CM

## 2021-07-09 DIAGNOSIS — E559 Vitamin D deficiency, unspecified: Secondary | ICD-10-CM

## 2021-07-09 DIAGNOSIS — Z6841 Body Mass Index (BMI) 40.0 and over, adult: Secondary | ICD-10-CM | POA: Diagnosis not present

## 2021-07-09 MED ORDER — VITAMIN D (ERGOCALCIFEROL) 1.25 MG (50000 UNIT) PO CAPS
50000.0000 [IU] | ORAL_CAPSULE | ORAL | 0 refills | Status: DC
Start: 2021-07-09 — End: 2021-08-05
  Filled 2021-07-09: qty 4, 28d supply, fill #0

## 2021-07-09 NOTE — Progress Notes (Signed)
Chief Complaint:   OBESITY Alex [name] is here to discuss NAZIM SAFKO progress with [name] obesity treatment plan along with follow-up of JULIANN KEELEY obesity related diagnoses. Dazhon is on the Category 3 Plan and states KAHLIN SCHAUFLER is following ISSACHAR HAWRYLUK eating plan approximately 99.8% of the time. Xzaiden states RHYTHM ALATORRE is doing weights and cardio for 30-45 minutes 3 times per week.  Today's visit was #: 2 Starting weight: 311 lbs Starting date: 06/25/2021 Today's weight: 305 lbs Today's date: 07/09/2021 Total lbs lost to date: 6 Total lbs lost since last in-office visit: 6  Interim History: Alex [name] has done very well with weight loss on his plan. His hunger was mostly controlled. He had some challenges with meal timing due to his work schedule.  Subjective:   1. Vitamin D deficiency Alex [name] has a new diagnosis of Vit D deficiency. He is on multivitamins, but his Vit D level is not yet at goal of 50. I discussed labs with the patient today.  2. Exercise-induced asthma Alex [name] has been using his inhaler before exercising and this seems to be helping. He  is exercising regularly and he feels well overall.  3. At risk for heart disease Alex [name] is at a higher than average risk for cardiovascular disease due to obesity.   Assessment/Plan:   1. Vitamin D deficiency Low Vitamin D level contributes to fatigue and are associated with obesity, breast, and colon cancer. Alex [name] agreed to start prescription Vitamin D 50,000 IU every week with no refills. We will recheck labs in 3 months, and he will follow-up for routine testing of Vitamin D, at least 2-3 times per year to avoid over-replacement.  - Vitamin D, Ergocalciferol, (DRISDOL) 1.25 MG (50000 UNIT) CAPS capsule; Take 1 capsule (50,000 Units total) by mouth every 7 (seven) days.  Dispense: 4 capsule; Refill: 0  2. Exercise-induced asthma Alex [name] is to use albuterol approximately 30  minutes before exercising to maximize his benefits.  3. At risk for heart disease Alex [name] was given approximately 30 minutes of coronary artery disease prevention counseling today. Alex [name] is 40 y.o. adult and has risk factors for heart disease including obesity. We discussed intensive lifestyle modifications today with an emphasis on specific weight loss instructions and strategies.   Repetitive spaced learning was employed today to elicit superior memory formation and behavioral change.  4. Obesity with current BMI 43.8 Rece is currently in the action stage of change. As such, Alex [name] goal is to continue with weight loss efforts. Alex [name] has agreed to the Category 3 Plan.   Exercise goals: As is.  Behavioral modification strategies: better snacking choices, dealing with family or coworker sabotage, travel eating strategies, and celebration eating strategies.  Alex [name] has agreed to follow-up with our clinic in 2 weeks. Alex [name] was informed of the importance of frequent follow-up visits to maximize Alex [name] success with intensive lifestyle modifications for Alex [name] multiple health conditions.   Objective:   Blood pressure 103/60, pulse 82, temperature 98.7 F (37.1 C), height '5\' 10"'$  (1.778 m), weight (!) 305 lb (138.3 kg), SpO2 99 %. Body mass index is 43.76 kg/m.  General: Cooperative, alert, well developed, in no acute distress. HEENT: Conjunctivae and lids unremarkable. Cardiovascular: Regular rhythm.  Lungs: Normal work of breathing. Neurologic: No focal deficits.   Lab Results  Component Value Date   CREATININE 0.87 06/25/2021   BUN 13 06/25/2021  NA 140 06/25/2021   K 4.7 06/25/2021   CL 103 06/25/2021   CO2 18 (L) 06/25/2021   Lab Results  Component Value Date   ALT 36 06/25/2021   AST 28 06/25/2021   ALKPHOS 82 06/25/2021   BILITOT 0.5 06/25/2021   Lab Results  Component Value Date   HGBA1C 5.0 06/25/2021    Lab Results  Component Value Date   INSULIN 6.5 06/25/2021   Lab Results  Component Value Date   TSH 1.820 06/25/2021   Lab Results  Component Value Date   CHOL 163 06/13/2021   HDL 48 06/13/2021   LDLCALC 102 (H) 06/13/2021   TRIG 69 06/13/2021   CHOLHDL 3.2 03/07/2019   Lab Results  Component Value Date   VD25OH 33.0 06/25/2021   Lab Results  Component Value Date   WBC 5.8 06/25/2021   HGB 14.3 06/25/2021   HCT 43.5 06/25/2021   MCV 85 06/25/2021   PLT 218 06/05/2021   No results found for: IRON, TIBC, FERRITIN  Attestation Statements:   Reviewed by clinician on day of visit: allergies, medications, problem list, medical history, surgical history, family history, social history, and previous encounter notes.   I, Trixie Dredge, am acting as transcriptionist for [name], MD.  I have reviewed the above documentation for accuracy and completeness, and I agree with the above. -  [name], MD

## 2021-07-10 ENCOUNTER — Other Ambulatory Visit: Payer: Self-pay

## 2021-07-10 ENCOUNTER — Other Ambulatory Visit (HOSPITAL_COMMUNITY): Payer: Self-pay

## 2021-07-11 ENCOUNTER — Other Ambulatory Visit (HOSPITAL_COMMUNITY): Payer: Self-pay

## 2021-07-14 ENCOUNTER — Other Ambulatory Visit (HOSPITAL_COMMUNITY): Payer: Self-pay

## 2021-07-16 ENCOUNTER — Other Ambulatory Visit (HOSPITAL_COMMUNITY): Payer: Self-pay

## 2021-07-16 DIAGNOSIS — F330 Major depressive disorder, recurrent, mild: Secondary | ICD-10-CM | POA: Diagnosis not present

## 2021-07-16 DIAGNOSIS — F332 Major depressive disorder, recurrent severe without psychotic features: Secondary | ICD-10-CM | POA: Diagnosis not present

## 2021-07-16 DIAGNOSIS — F4322 Adjustment disorder with anxiety: Secondary | ICD-10-CM | POA: Diagnosis not present

## 2021-07-16 DIAGNOSIS — F411 Generalized anxiety disorder: Secondary | ICD-10-CM | POA: Diagnosis not present

## 2021-07-16 MED ORDER — HYDROXYZINE HCL 25 MG PO TABS: ORAL_TABLET | ORAL | 2 refills | Status: DC

## 2021-07-16 MED ORDER — BUSPIRONE HCL 15 MG PO TABS
15.0000 mg | ORAL_TABLET | Freq: Three times a day (TID) | ORAL | 2 refills | Status: DC
  Filled 2021-12-21: qty 90, 30d supply, fill #0
  Filled 2022-04-03: qty 90, 30d supply, fill #1
  Filled 2022-05-01: qty 90, 30d supply, fill #2

## 2021-07-16 MED ORDER — BUPROPION HCL ER (XL) 300 MG PO TB24
300.0000 mg | ORAL_TABLET | Freq: Every day | ORAL | 2 refills | Status: DC
  Filled 2021-07-16: qty 30, 30d supply, fill #0

## 2021-07-16 MED ORDER — HYDROXYZINE HCL 25 MG PO TABS
ORAL_TABLET | ORAL | 2 refills | Status: DC
  Filled 2021-07-16: qty 90, 30d supply, fill #0

## 2021-07-16 MED ORDER — BUSPIRONE HCL 15 MG PO TABS
ORAL_TABLET | ORAL | 2 refills | Status: DC
  Filled 2021-07-16: qty 90, 30d supply, fill #0

## 2021-07-16 MED ORDER — BUPROPION HCL ER (XL) 300 MG PO TB24
300.0000 mg | ORAL_TABLET | Freq: Every day | ORAL | 2 refills | Status: DC
  Filled 2021-08-24 – 2021-09-09 (×3): qty 30, 30d supply, fill #0
  Filled 2021-10-12: qty 30, 30d supply, fill #1
  Filled 2021-11-16: qty 30, 30d supply, fill #2

## 2021-07-17 ENCOUNTER — Other Ambulatory Visit (HOSPITAL_COMMUNITY): Payer: Self-pay

## 2021-07-18 ENCOUNTER — Other Ambulatory Visit (HOSPITAL_COMMUNITY): Payer: Self-pay

## 2021-07-22 ENCOUNTER — Encounter (INDEPENDENT_AMBULATORY_CARE_PROVIDER_SITE_OTHER): Payer: Self-pay | Admitting: Family Medicine

## 2021-07-22 ENCOUNTER — Other Ambulatory Visit: Payer: Self-pay

## 2021-07-22 ENCOUNTER — Telehealth (INDEPENDENT_AMBULATORY_CARE_PROVIDER_SITE_OTHER): Payer: 59 | Admitting: Psychology

## 2021-07-22 ENCOUNTER — Ambulatory Visit (INDEPENDENT_AMBULATORY_CARE_PROVIDER_SITE_OTHER): Payer: 59 | Admitting: Family Medicine

## 2021-07-22 VITALS — BP 117/72 | HR 67 | Temp 98.5°F | Ht 70.0 in | Wt 303.0 lb

## 2021-07-22 DIAGNOSIS — Z6841 Body Mass Index (BMI) 40.0 and over, adult: Secondary | ICD-10-CM | POA: Diagnosis not present

## 2021-07-22 DIAGNOSIS — F419 Anxiety disorder, unspecified: Secondary | ICD-10-CM

## 2021-07-22 DIAGNOSIS — E559 Vitamin D deficiency, unspecified: Secondary | ICD-10-CM

## 2021-07-22 DIAGNOSIS — F3289 Other specified depressive episodes: Secondary | ICD-10-CM

## 2021-07-22 NOTE — Progress Notes (Signed)
Chief Complaint:   OBESITY Alex [name] is here to discuss Alex [name] progress with Alex [name] obesity treatment plan along with follow-up of Alex [name] obesity related diagnoses. Alex [name] is on the Category 3 Plan and states Alex [name] is following Alex [name] eating plan approximately 80% of the time. Alex [name] states Alex [name] is doing cardio and weights for 90-120 minutes 3 times per week.  Today's visit was #: 3 Starting weight: 311 lbs Starting date: 06/25/2021 Today's weight: 303 lbs Today's date: 07/22/2021 Total lbs lost to date: 8 Total lbs lost since last in-office visit: 2  Interim History: Alex [name] continues to do well with weight loss. He notes a mild increase in hunger over the second half of the day. He also notes significant work stress and work place Alex [name], Alex [name].  Subjective:   1. Anxiety Alex [name] notes some increased anxiety, and is worsening with increased work stress. His Buspar was increased recently. This stress may be affecting his mood.  2. Vitamin D deficiency Alex [name] is stable on Vit D, and he denies nausea or vomiting. His level is not yet at goal.  Assessment/Plan:   1. Anxiety Behavior modification techniques were discussed today to help Alex [name] deal with Alex [name] anxiety. Alex [name] will continue Wellbutrin and Buspar as is and will continue to follow up as directed. Orders and follow up as documented in patient record.   2. Vitamin D deficiency Low Vitamin D level contributes to fatigue and are associated with obesity, breast, and colon cancer. Alex [name] will continue prescription Vitamin D and we will recheck labs in 6 weeks. He will follow-up for routine testing of Vitamin D, at least 2-3 times per year to avoid over-replacement.  3. Obesity with current BMI 43.5 Alex [name] is currently in the action stage of change. As such, Alex [name] goal is to continue with weight loss efforts. Alex [name] has agreed to  the Category 3 Plan.   Alex [name] will try to increase protein in snacks to help decrease excessive hunger.  Exercise goals: As is.  Behavioral modification strategies: increasing lean protein intake and dealing with family or coworker sabotage.  Alex [name] has agreed to follow-up with our clinic in 2 weeks. Alex [name] was informed of the importance of frequent follow-up visits to maximize Alex [name] success with intensive lifestyle modifications for Alex [name] multiple health conditions.   Objective:   Blood pressure 117/72, pulse 67, temperature 98.5 F (36.9 C), height 5\' 10"  (1.778 m), weight (!) 303 lb (137.4 kg), SpO2 97 %. Body mass index is 43.48 kg/m.  General: Cooperative, alert, well developed, in no acute distress. HEENT: Conjunctivae and lids unremarkable. Cardiovascular: Regular rhythm.  Lungs: Normal work of breathing. Neurologic: No focal deficits.   Lab Results  Component Value Date   CREATININE 0.87 06/25/2021   BUN 13 06/25/2021   NA 140 06/25/2021   K 4.7 06/25/2021   CL 103 06/25/2021   CO2 18 (L) 06/25/2021   Lab Results  Component Value Date   ALT 36 06/25/2021   AST 28 06/25/2021   ALKPHOS 82 06/25/2021   BILITOT 0.5 06/25/2021   Lab Results  Component Value Date   HGBA1C 5.0 06/25/2021   Lab Results  Component Value Date   INSULIN 6.5 06/25/2021   Lab Results  Component Value Date   TSH 1.820 06/25/2021   Lab Results  Component Value Date   CHOL 163 06/13/2021   HDL 48  06/13/2021   LDLCALC 102 (H) 06/13/2021   TRIG 69 06/13/2021   CHOLHDL 3.2 03/07/2019   Lab Results  Component Value Date   VD25OH 33.0 06/25/2021   Lab Results  Component Value Date   WBC 5.8 06/25/2021   HGB 14.3 06/25/2021   HCT 43.5 06/25/2021   MCV 85 06/25/2021   PLT 218 06/05/2021   No results found for: IRON, TIBC, FERRITIN  Attestation Statements:   Reviewed by clinician on day of visit: allergies, medications, problem list,  medical history, surgical history, family history, social history, and previous encounter notes.  Time spent on visit including pre-visit chart review and post-visit care and charting was 30 minutes.    I, Alex [name], am acting as transcriptionist for Alex Nip, MD.  I have reviewed the above documentation for accuracy and completeness, and I agree with the above. -  Alex Nip, MD

## 2021-07-29 NOTE — Progress Notes (Signed)
  Office: [phone]  /  Fax: [phone]    Date: August 12, 2021   Appointment Start Time: 2:02pm Duration: 28 minutes Provider: Glennie Isle, Psy.D. Type of Session: Individual Therapy  Location of Patient: Home (private location) Location of Provider: Provider's Home (private office) Type of Contact: Telepsychological Visit via MyChart Video Visit  Session Content: [name] is a 40 y.o. adult presenting for a follow-up appointment to address the previously established treatment goal of increasing coping skills.Today's appointment was a telepsychological visit due to COVID-19. Alex [name] provided verbal consent for today's telepsychological appointment and he is aware he is responsible for securing confidentiality on his end of the session. Prior to proceeding with today's appointment, Per's physical location at the time of this appointment was obtained as well a phone number he could be reached at in the event of technical difficulties. Alex [name] and this provider participated in today's telepsychological service.   This provider conducted a brief check-in. [name] discussed utilizing the previously shared handout. He shared about a recent work related situation that resulted in emotional hunger. Associated thoughts/feelings processed. Psychoeducation provided regarding all or nothing thinking. Moreover, psychoeducation regarding triggers for emotional eating was provided. [name] was provided a handout, and encouraged to utilize the handout between now and the next appointment to increase awareness of triggers and frequency. Alex [name] agreed. Alex [name] provided verbal consent during today's appointment for this provider to send a handout about triggers via e-mail. Moreover, psychoeducation regarding the importance of self-care utilizing the oxygen mask metaphor was provided. Furthermore, psychoeducation regarding pleasurable activities, including its impact on emotional eating and overall  well-being was provided. [name] was provided with a handout with various options of pleasurable activities, and was encouraged to engage in one activity a day and additional activities as needed when triggered to emotionally eat. Alex [name] agreed. Alex [name] provided verbal consent during today's appointment for this provider to send a handout with pleasurable activities via e-mail. Overall, [name] was receptive to today's appointment as evidenced by openness to sharing, responsiveness to feedback, and willingness to explore triggers for emotional eating and engage in pleasurable activities.  Mental Status Examination:  Appearance: well groomed and appropriate hygiene  Behavior: appropriate to circumstances Mood: euthymic Affect: mood congruent Speech: normal in rate, volume, and tone Eye Contact: appropriate Psychomotor Activity: appropriate Gait: unable to assess Thought Process: linear, logical, and goal directed  Thought Content/Perception: no hallucinations, delusions, bizarre thinking or behavior reported or observed and no evidence or endorsement of suicidal and homicidal ideation, plan, and intent Orientation: time, person, place, and purpose of appointment Memory/Concentration: memory, attention, language, and fund of knowledge intact  Insight/Judgment: fair  Interventions:  Conducted a brief chart review Provided empathic reflections and validation Reviewed content from the previous session Employed supportive psychotherapy interventions to facilitate reduced distress and to improve coping skills with identified stressors Psychoeducation provided regarding triggers for emotional eating Psychoeducation provided regarding self-care Psychoeducation provided regarding pleasurable activities   DSM-5 Diagnosis(es):  F32.89 Other Specified Depressive Disorder, Emotional Eating Behaviors and F41.9 Unspecified Anxiety Disorder  Treatment Goal & Progress: During the initial appointment with  this provider, the following treatment goal was established: increase coping skills. [name] has demonstrated progress in his goal as evidenced by increased awareness of hunger patterns.   Plan: Based on appointment availability and [name]'s schedule, the next appointment will be scheduled in one month, which will be via MyChart Video Visit. The next session will focus on working towards the established treatment goal.

## 2021-08-05 ENCOUNTER — Other Ambulatory Visit: Payer: Self-pay

## 2021-08-05 ENCOUNTER — Ambulatory Visit (INDEPENDENT_AMBULATORY_CARE_PROVIDER_SITE_OTHER): Payer: 59 | Admitting: Family Medicine

## 2021-08-05 ENCOUNTER — Other Ambulatory Visit (HOSPITAL_COMMUNITY): Payer: Self-pay

## 2021-08-05 ENCOUNTER — Encounter (INDEPENDENT_AMBULATORY_CARE_PROVIDER_SITE_OTHER): Payer: Self-pay | Admitting: Family Medicine

## 2021-08-05 VITALS — BP 107/68 | HR 66 | Temp 97.7°F | Ht 70.0 in | Wt 300.0 lb

## 2021-08-05 DIAGNOSIS — E559 Vitamin D deficiency, unspecified: Secondary | ICD-10-CM | POA: Diagnosis not present

## 2021-08-05 DIAGNOSIS — Z9189 Other specified personal risk factors, not elsewhere classified: Secondary | ICD-10-CM

## 2021-08-05 DIAGNOSIS — F32A Depression, unspecified: Secondary | ICD-10-CM

## 2021-08-05 DIAGNOSIS — F419 Anxiety disorder, unspecified: Secondary | ICD-10-CM | POA: Diagnosis not present

## 2021-08-05 DIAGNOSIS — Z6841 Body Mass Index (BMI) 40.0 and over, adult: Secondary | ICD-10-CM

## 2021-08-05 MED ORDER — VITAMIN D (ERGOCALCIFEROL) 1.25 MG (50000 UNIT) PO CAPS
50000.0000 [IU] | ORAL_CAPSULE | ORAL | 0 refills | Status: DC
Start: 2021-08-05 — End: 2021-10-29
  Filled 2021-08-05 – 2021-09-29 (×2): qty 4, 28d supply, fill #0

## 2021-08-05 NOTE — Progress Notes (Signed)
Chief Complaint:   OBESITY Alex [name] is here to discuss Alex [name] progress with Alex [name] obesity treatment plan along with follow-up of Alex [name] obesity related diagnoses. Alex [name] is on the Category 3 Plan and states Alex [name] is following SAVA PROBY eating plan approximately 80% of the time. Alex [name] states Alex [name] is doing cardio and weight training for 90-120 minutes 3 times per week.  Today's visit was #: 4 Starting weight: 311 lbs Starting date: 06/25/2021 Today's weight: 300 lbs Today's date: 08/05/2021 Total lbs lost to date: 11 Total lbs lost since last in-office visit: 3  Interim History: Alex [name] continues to do very well with weight loss. He had a session with Dr. [name] and he recognizes that he struggles with some emotional eating behaviors, and he is working on being mindful of this. He is doing well meeting his protein goals overall.  Subjective:   1. Vitamin D deficiency Alex [name] is stable on Vit D, but his level is not yet at goal. No side effects were noted.  2. Anxiety and depression Alex [name]'s mood is stable on his medications. He is working on decreasing emotional eating behaviors, and he is making good progress.  3. At risk for heart disease Alex [name] is at a higher than average risk for cardiovascular disease due to obesity.   Assessment/Plan:   1. Vitamin D deficiency Low Vitamin D level contributes to fatigue and are associated with obesity, breast, and colon cancer. Alex [name] agrees to continue to take prescription Vitamin D @50 ,000 IU every week and will follow-up for routine testing of Vitamin D, at least 2-3 times per year to avoid over-replacement.  - Vitamin D, Ergocalciferol, (DRISDOL) 1.25 MG (50000 UNIT) CAPS capsule; Take 1 capsule (50,000 Units total) by mouth every 7 (seven) days.  Dispense: 4 capsule; Refill: 0  2. Anxiety and depression Behavior modification techniques were discussed today to  help Alex [name] deal with Alex [name]'s depression and anxiety.  Alex [name] will continue his medications, and we will continue to help with identifying and decrease emotional eating behaviors. Orders and follow up as documented in patient record.   3. At risk for heart disease Alex [name] was given approximately 15 minutes of coronary artery disease prevention counseling today. Alex [name] is 40 y.o. adult and has risk factors for heart disease including obesity. We discussed intensive lifestyle modifications today with an emphasis on specific weight loss instructions and strategies.   Repetitive spaced learning was employed today to elicit superior memory formation and behavioral change.  4. Obesity with current BMI 43.2 Alex [name] is currently in the action stage of change. As such, Alex [name] goal is to continue with weight loss efforts. Alex [name] has agreed to the Category 3 Plan.   Exercise goals: As is.  Behavioral modification strategies: increasing lean protein intake and emotional eating strategies.  Alex [name] has agreed to follow-up with our clinic in 2 to 3 weeks. Alex [name] was informed of the importance of frequent follow-up visits to maximize Alex [name] success with intensive lifestyle modifications for Alex [name] multiple health conditions.   Objective:   Blood pressure 107/68, pulse 66, temperature 97.7 F (36.5 C), height 5\' 10"  (1.778 m), weight 300 lb (136.1 kg), SpO2 100 %. Body mass index is 43.05 kg/m.  General: Cooperative, alert, well developed, in no acute distress. HEENT: Conjunctivae and lids unremarkable. Cardiovascular: Regular rhythm.  Lungs: Normal work of breathing. Neurologic: No  focal deficits.   Lab Results  Component Value Date   CREATININE 0.87 06/25/2021   BUN 13 06/25/2021   NA 140 06/25/2021   K 4.7 06/25/2021   CL 103 06/25/2021   CO2 18 (L) 06/25/2021   Lab Results  Component Value Date   ALT 36 06/25/2021    AST 28 06/25/2021   ALKPHOS 82 06/25/2021   BILITOT 0.5 06/25/2021   Lab Results  Component Value Date   HGBA1C 5.0 06/25/2021   Lab Results  Component Value Date   INSULIN 6.5 06/25/2021   Lab Results  Component Value Date   TSH 1.820 06/25/2021   Lab Results  Component Value Date   CHOL 163 06/13/2021   HDL 48 06/13/2021   LDLCALC 102 (H) 06/13/2021   TRIG 69 06/13/2021   CHOLHDL 3.2 03/07/2019   Lab Results  Component Value Date   VD25OH 33.0 06/25/2021   Lab Results  Component Value Date   WBC 5.8 06/25/2021   HGB 14.3 06/25/2021   HCT 43.5 06/25/2021   MCV 85 06/25/2021   PLT 218 06/05/2021   No results found for: IRON, TIBC, FERRITIN  Attestation Statements:   Reviewed by clinician on day of visit: allergies, medications, problem list, medical history, surgical history, family history, social history, and previous encounter notes.   I, Alex [name], am acting as transcriptionist for Alex Nip, MD.  I have reviewed the above documentation for accuracy and completeness, and I agree with the above. -  Alex Nip, MD

## 2021-08-12 ENCOUNTER — Telehealth (INDEPENDENT_AMBULATORY_CARE_PROVIDER_SITE_OTHER): Payer: 59 | Admitting: Psychology

## 2021-08-12 DIAGNOSIS — F3289 Other specified depressive episodes: Secondary | ICD-10-CM | POA: Diagnosis not present

## 2021-08-12 DIAGNOSIS — F419 Anxiety disorder, unspecified: Secondary | ICD-10-CM

## 2021-08-13 ENCOUNTER — Other Ambulatory Visit (HOSPITAL_COMMUNITY): Payer: Self-pay

## 2021-08-19 ENCOUNTER — Encounter (INDEPENDENT_AMBULATORY_CARE_PROVIDER_SITE_OTHER): Payer: Self-pay | Admitting: Family Medicine

## 2021-08-19 ENCOUNTER — Other Ambulatory Visit: Payer: Self-pay

## 2021-08-19 ENCOUNTER — Ambulatory Visit (INDEPENDENT_AMBULATORY_CARE_PROVIDER_SITE_OTHER): Payer: 59 | Admitting: Family Medicine

## 2021-08-19 VITALS — BP 119/72 | HR 76 | Temp 98.7°F | Ht 70.0 in | Wt 298.0 lb

## 2021-08-19 DIAGNOSIS — Z6841 Body Mass Index (BMI) 40.0 and over, adult: Secondary | ICD-10-CM | POA: Diagnosis not present

## 2021-08-19 DIAGNOSIS — F418 Other specified anxiety disorders: Secondary | ICD-10-CM | POA: Diagnosis not present

## 2021-08-19 NOTE — Progress Notes (Signed)
Chief Complaint:   OBESITY Alex [name] is here to discuss Alex [name] progress with Alex [name] obesity treatment plan along with follow-up of Alex [name] obesity related diagnoses. Alex [name] is on the Category 3 Plan and states Alex [name] is following [name] eating plan approximately 70% of the time. Alex [name] states Alex [name] is doing cardio for 90-120 minutes 3 times per week.  Today's visit was #: 5 Starting weight: 311 lbs Starting date: 06/25/2021 Today's weight: 298 lbs Today's date: 08/19/2021 Total lbs lost to date: 13 Total lbs lost since last in-office visit: 2  Interim History: Alex [name] continues to do well with weight loss on his Category 3 eating plan. He did some celebrations eating, but he was able to get back on track relatively easily.  Subjective:   1. Depression with anxiety Alex [name]'s mood is stable on his medications, and overall he is sleeping well when he is on his normal routine. He is working on decreasing emotional eating behaviors and not feeling deprived.  Assessment/Plan:   1. Depression with anxiety Alex [name] will continue his medications and mindful eating, and will continue to monitor. He was offered support and encouragement. Orders and follow up as documented in patient record.   2. Obesity with current BMI 42.8 Alex [name] is currently in the action stage of change. As such, Alex [name] goal is to continue with weight loss efforts. Alex [name] has agreed to the Category 3 Plan.   Exercise goals: As is.  Behavioral modification strategies: celebration eating strategies.  Alex [name] has agreed to follow-up with our clinic in 2 to 3 weeks. Alex [name] was informed of the importance of frequent follow-up visits to maximize Alex [name] success with intensive lifestyle modifications for Alex [name] multiple health conditions.   Objective:   Blood pressure 119/72, pulse 76, temperature 98.7 F (37.1  C), height 5\' 10"  (1.778 m), weight 298 lb (135.2 kg), SpO2 100 %. Body mass index is 42.76 kg/m.  General: Cooperative, alert, well developed, in no acute distress. HEENT: Conjunctivae and lids unremarkable. Cardiovascular: Regular rhythm.  Lungs: Normal work of breathing. Neurologic: No focal deficits.   Lab Results  Component Value Date   CREATININE 0.87 06/25/2021   BUN 13 06/25/2021   NA 140 06/25/2021   K 4.7 06/25/2021   CL 103 06/25/2021   CO2 18 (L) 06/25/2021   Lab Results  Component Value Date   ALT 36 06/25/2021   AST 28 06/25/2021   ALKPHOS 82 06/25/2021   BILITOT 0.5 06/25/2021   Lab Results  Component Value Date   HGBA1C 5.0 06/25/2021   Lab Results  Component Value Date   INSULIN 6.5 06/25/2021   Lab Results  Component Value Date   TSH 1.820 06/25/2021   Lab Results  Component Value Date   CHOL 163 06/13/2021   HDL 48 06/13/2021   LDLCALC 102 (H) 06/13/2021   TRIG 69 06/13/2021   CHOLHDL 3.2 03/07/2019   Lab Results  Component Value Date   VD25OH 33.0 06/25/2021   Lab Results  Component Value Date   WBC 5.8 06/25/2021   HGB 14.3 06/25/2021   HCT 43.5 06/25/2021   MCV 85 06/25/2021   PLT 218 06/05/2021   No results found for: IRON, TIBC, FERRITIN  Attestation Statements:   Reviewed by clinician on day of visit: allergies, medications, problem list, medical history, surgical history, family history, social history, and previous encounter notes.  Time  spent on visit including pre-visit chart review and post-visit care and charting was 30 minutes.    I, Trixie Dredge, am acting as transcriptionist for [name], MD.  I have reviewed the above documentation for accuracy and completeness, and I agree with the above. -  [name], MD

## 2021-08-25 ENCOUNTER — Other Ambulatory Visit: Payer: Self-pay

## 2021-08-25 ENCOUNTER — Encounter: Payer: Self-pay | Admitting: Cardiovascular Disease

## 2021-08-25 ENCOUNTER — Ambulatory Visit (INDEPENDENT_AMBULATORY_CARE_PROVIDER_SITE_OTHER): Payer: 59 | Admitting: Cardiovascular Disease

## 2021-08-25 VITALS — BP 120/72 | HR 67 | Ht 70.0 in | Wt 305.6 lb

## 2021-08-25 DIAGNOSIS — R079 Chest pain, unspecified: Secondary | ICD-10-CM

## 2021-08-25 NOTE — Progress Notes (Signed)
Chief Complaint  Patient presents with   New Patient (Initial Visit)    Chest pain   History of Present Illness: 40 yo with history of anxiety, depression and palpitations who is here today as a new patient for the evaluation of chest pain. The patient was a self referral to the CT surgery office in August 2022 for evaluation of chest pain. Dr. Kipp Brood saw Alex [name] in August 2022 and felt that the pain was atypical. Echo 06/16/21 with LVEF=60-65%, mild LVH. No valve disease. CT coronary calcium score of 0.   Alex [name] tells me today that he had several episodes of chest pressure over the past six months. He is very active and exercises three days per week. Overall feeling well. No chest pressure over the past few weeks.   Primary Care Physician: Juline Patch, MD   Past Medical History:  Diagnosis Date   Anxiety    Arterial embolism of left leg (HCC)    Asthma    exercise-induced   Constipation    Depression    Infertility male    Palpitation    SOB (shortness of breath)    Vitamin D deficiency     Past Surgical History:  Procedure Laterality Date   WISDOM TOOTH EXTRACTION      Current Outpatient Medications  Medication Sig Dispense Refill   albuterol (VENTOLIN HFA) 108 (90 Base) MCG/ACT inhaler Inhale 1-2 puffs into the lungs every 6 (six) hours as needed for wheezing or shortness of breath. 1-2 puffs every 6 hours as needed 6.7 g 11   buPROPion (WELLBUTRIN XL) 300 MG 24 hr tablet Take 1 tablet (300 mg total) by mouth daily. 30 tablet 2   busPIRone (BUSPAR) 15 MG tablet Take 1 tablet (15 mg total) by mouth 3 (three) times daily for anxiety 90 tablet 2   Fexofenadine HCl (ALLEGRA PO) Take by mouth.     hydrOXYzine (ATARAX/VISTARIL) 25 MG tablet Take 1 tablet (25 mg total) by mouth 3 (three) times daily as needed for anxiety. 60 tablet 0   Multiple Vitamin (MULTIVITAMIN) tablet Take 1 tablet by mouth daily.     Probiotic Product (PROBIOTIC DAILY PO) Take by mouth.      Vitamin D, Ergocalciferol, (DRISDOL) 1.25 MG (50000 UNIT) CAPS capsule Take 1 capsule (50,000 Units total) by mouth every 7 (seven) days. 4 capsule 0   No current facility-administered medications for this visit.    No Known Allergies  Social History   Socioeconomic History   Marital status: Married    Spouse name: Not on file   Number of children: Not on file   Years of education: Not on file   Highest education level: Not on file  Occupational History   Not on file  Tobacco Use   Smoking status: Never   Smokeless tobacco: Never  Vaping Use   Vaping Use: Never used  Substance and Sexual Activity   Alcohol use: Yes    Alcohol/week: 2.0 standard drinks    Types: 2 Standard drinks or equivalent per week    Comment: occasional   Drug use: No   Sexual activity: Yes  Other Topics Concern   Not on file  Social History Narrative   Drinks 3 cups of caffeine daily.   Social Determinants of Health   Financial Resource Strain: Not on file  Food Insecurity: Not on file  Transportation Needs: Not on file  Physical Activity: Not on file  Stress: Not on file  Social Connections:  Not on file  Intimate Partner Violence: Not on file    Family History  Problem Relation Age of Onset   Anxiety disorder Mother    Depression Mother    Obesity Mother    Obesity Father    Hyperlipidemia Father    Hypertension Father    Depression Father    Anxiety disorder Father    Cancer Maternal Grandfather    Cancer Paternal Grandfather     Review of Systems:  As stated in the HPI and otherwise negative.   BP 120/72   Pulse 67   Ht 5\' 10"  (1.778 m)   Wt (!) 305 lb 9.6 oz (138.6 kg)   SpO2 100%   BMI 43.85 kg/m   Physical Examination: General: Well developed, well nourished, NAD  HEENT: OP clear, mucus membranes moist  SKIN: warm, dry. No rashes. Neuro: No focal deficits  Musculoskeletal: Muscle strength 5/5 all ext  Psychiatric: Mood and affect normal  Neck: No JVD, no carotid  bruits, no thyromegaly, no lymphadenopathy.  Lungs:Clear bilaterally, no wheezes, rhonci, crackles Cardiovascular: Regular rate and rhythm. No murmurs, gallops or rubs. Abdomen:Soft. Bowel sounds present. Non-tender.  Extremities: No lower extremity edema. Pulses are 2 + in the bilateral DP/PT.  EKG:  EKG is ordered today. The ekg ordered today demonstrates Sinus  Echo August 2022:   1. Left ventricular ejection fraction, by estimation, is 60 to 65%. The  left ventricle has normal function. The left ventricle has no regional  wall motion abnormalities. There is mild left ventricular hypertrophy.  Left ventricular diastolic parameters  were normal.   2. Right ventricular systolic function is normal. The right ventricular  size is normal.   3. The mitral valve is normal in structure. Trivial mitral valve  regurgitation.   4. The aortic valve is normal in structure. Aortic valve regurgitation is  not visualized.   Recent Labs: 06/05/2021: Magnesium 2.0; Platelets 218 06/25/2021: ALT 36; BUN 13; Creatinine, Ser 0.87; Hemoglobin 14.3; Potassium 4.7; Sodium 140; TSH 1.820   Lipid Panel    Component Value Date/Time   CHOL 163 06/13/2021 0809   TRIG 69 06/13/2021 0809   HDL 48 06/13/2021 0809   CHOLHDL 3.2 03/07/2019 0927   LDLCALC 102 (H) 06/13/2021 0809     Wt Readings from Last 3 Encounters:  08/25/21 (!) 305 lb 9.6 oz (138.6 kg)  08/19/21 298 lb (135.2 kg)  08/05/21 300 lb (136.1 kg)      Assessment and Plan:   1. Chest pain: He has atypical pain. He has a normal echo. Calcium score zero. Will arrange an exercise treadmill stress test to exclude ischemia. His pain is most likely not cardiac related.   Current medicines are reviewed at length with the patient today.  The patient does not have concerns regarding medicines.  The following changes have been made:  no change  Labs/ tests ordered today include:   Orders Placed This Encounter  Procedures   Exercise  Tolerance Test   EKG 12-Lead      Disposition:   F/U with me as needed.    Signed, Alex Chandler, MD 08/25/2021 1:40 PM    [facility] [place], [place], [place]  [address] Phone: [phone]; Fax: [phone]

## 2021-08-25 NOTE — Patient Instructions (Signed)
Medication Instructions:  No changes *If you need a refill on your cardiac medications before your next appointment, please call your pharmacy*   Lab Work: none If you have labs (blood work) drawn today and your tests are completely normal, you will receive your results only by: [place] (if you have MyChart) OR A paper copy in the mail If you have any lab test that is abnormal or we need to change your treatment, we will call you to review the results.   Testing/Procedures: Your physician has requested that you have an exercise tolerance test. For further information please visit [email or web address]. Please also follow instruction sheet, as given.  Follow-Up: As needed.  Other Instructions

## 2021-08-26 ENCOUNTER — Other Ambulatory Visit (HOSPITAL_COMMUNITY): Payer: Self-pay

## 2021-08-26 DIAGNOSIS — F330 Major depressive disorder, recurrent, mild: Secondary | ICD-10-CM | POA: Diagnosis not present

## 2021-08-26 DIAGNOSIS — F411 Generalized anxiety disorder: Secondary | ICD-10-CM | POA: Diagnosis not present

## 2021-08-26 DIAGNOSIS — F4322 Adjustment disorder with anxiety: Secondary | ICD-10-CM | POA: Diagnosis not present

## 2021-08-26 DIAGNOSIS — F332 Major depressive disorder, recurrent severe without psychotic features: Secondary | ICD-10-CM | POA: Diagnosis not present

## 2021-08-26 MED ORDER — BUSPIRONE HCL 15 MG PO TABS
15.0000 mg | ORAL_TABLET | Freq: Three times a day (TID) | ORAL | 2 refills | Status: DC | PRN
  Filled 2021-08-26: qty 90, 30d supply, fill #0

## 2021-08-26 MED ORDER — BUPROPION HCL ER (XL) 300 MG PO TB24
300.0000 mg | ORAL_TABLET | Freq: Every day | ORAL | 2 refills | Status: DC
  Filled 2021-08-26: qty 30, 30d supply, fill #0

## 2021-08-26 MED ORDER — HYDROXYZINE HCL 25 MG PO TABS
ORAL_TABLET | ORAL | 2 refills | Status: DC
  Filled 2021-08-26: qty 90, 30d supply, fill #0

## 2021-08-26 NOTE — Progress Notes (Signed)
  Office: [phone]  /  Fax: [phone]    Date: September 09, 2021   Appointment Start Time: 8:30am Duration: 34 minutes Provider: Glennie [name], Psy.D. Type of Session: Individual Therapy  Location of Patient: Home (private location) Location of Provider: Provider's Home (private office) Type of Contact: Telepsychological Visit via MyChart Video Visit  Session Content: Alex [name] is a 40 y.o. adult presenting for a follow-up appointment to address the previously established treatment goal of increasing coping skills.Today's appointment was a telepsychological visit due to COVID-19. Alex [name] provided verbal consent for today's telepsychological appointment and Alex [name] is aware Alex [name] is responsible for securing confidentiality on Alex [name] end of the session. Prior to proceeding with today's appointment, Alex [name]'s physical location at the time of this appointment was obtained as well a phone number Alex [name] could be reached at in the event of technical difficulties. Alex [name] and this provider participated in today's telepsychological service.   This provider conducted a brief check-in. Alex [name] shared about recent events, noting a reduction in stress at work and improvement in self-care. Reviewed pleasurable activities. Alex [name] shared about engagement in pleasurable activities. Alex [name] acknowledged, "The more happy I get, the more excuses I give myself [as it relates to deviating from his structured meal plan]." Alex [name] added Alex [name] feels Alex [name] does better with his eating habits when Alex [name] is experiencing low mood. This was further explored and processed. Psychoeducation provided regarding self-compassion. Alex [name] was engaged in a self-compassion exercise to help with eating-related challenges and other ongoing stressors. Alex [name] was encouraged to regularly ask, "What do I need right now?" Overall, Alex [name] was receptive to today's appointment as evidenced by openness to sharing,  responsiveness to feedback, and  willingness to work toward increasing self-compassion .  Mental Status Examination:  Appearance: well groomed and appropriate hygiene  Behavior: appropriate to circumstances Mood: euthymic Affect: mood congruent; tearful when discussing self-compassion Speech: normal in rate, volume, and tone Eye Contact: appropriate Psychomotor Activity: appropriate Gait: unable to assess Thought Process: linear, logical, and goal directed  Thought Content/Perception: no hallucinations, delusions, bizarre thinking or behavior reported or observed and no evidence or endorsement of suicidal and homicidal ideation, plan, and intent Orientation: time, person, place, and purpose of appointment Memory/Concentration: memory, attention, language, and fund of knowledge intact  Insight/Judgment: fair  Interventions:  Conducted a brief chart review Provided empathic reflections and validation Reviewed content from the previous session Provided positive reinforcement Employed supportive psychotherapy interventions to facilitate reduced distress and to improve coping skills with identified stressors Psychoeducation provided regarding self-compassion Engaged patient in a self-compassion exercise  DSM-5 Diagnosis(es): F32.89 Other Specified Depressive Disorder, Emotional Eating Behaviors and F41.9 Unspecified Anxiety Disorder  Treatment Goal & Progress: During the initial appointment with this provider, the following treatment goal was established: increase coping skills. Alex [name] has demonstrated progress in Alex [name] goal as evidenced by increased awareness in hunger patterns and engagement in pleasurable activities.   Plan: The next appointment will be scheduled in approximately two weeks, which will be via MyChart Video Visit. The next session will focus on working towards the established treatment goal. Alex [name] indicated Alex [name] continues to meet with his primary therapist monthly.

## 2021-09-01 ENCOUNTER — Other Ambulatory Visit: Payer: Self-pay

## 2021-09-03 ENCOUNTER — Encounter (INDEPENDENT_AMBULATORY_CARE_PROVIDER_SITE_OTHER): Payer: Self-pay | Admitting: Family Medicine

## 2021-09-03 ENCOUNTER — Ambulatory Visit (INDEPENDENT_AMBULATORY_CARE_PROVIDER_SITE_OTHER): Payer: 59 | Admitting: Family Medicine

## 2021-09-03 ENCOUNTER — Other Ambulatory Visit: Payer: Self-pay

## 2021-09-03 VITALS — BP 120/73 | HR 69 | Temp 98.3°F | Ht 70.0 in | Wt 299.0 lb

## 2021-09-03 DIAGNOSIS — Z6841 Body Mass Index (BMI) 40.0 and over, adult: Secondary | ICD-10-CM | POA: Diagnosis not present

## 2021-09-03 DIAGNOSIS — F418 Other specified anxiety disorders: Secondary | ICD-10-CM

## 2021-09-03 NOTE — Progress Notes (Signed)
Chief Complaint:   OBESITY Alex [name] is here to discuss Alex [name] progress with Alex [name] obesity treatment plan along with follow-up of Alex [name] obesity related diagnoses. Alex [name] is on the Category 3 Plan and states Alex [name] is following Alex [name] eating plan approximately 40% of the time. Alex [name] states Alex [name] is doing cardio for 90-120 minutes 3 times per week.  Today's visit was #: 6 Starting weight: 311 lbs Starting date: 06/25/2021 Today's weight: 299 lbs Today's date: 09/03/2021 Total lbs lost to date: 12 Total lbs lost since last in-office visit: 6  Interim History: Alex [name] continues to work on diet, exercise, and weight loss. He had extra challenges over Halloween and with some extra celebrations, which he did well dealing with this.  Subjective:   1. Depression with emotional eating behaviors Alex [name] continues to work on [employer] behaviors. Hs is stable on Wellbutrin and he notes some decreased stress at work at least temporarily. No side effects were noted.  Assessment/Plan:   1. Depression with emotional eating behaviors Alex [name] will continue Wellbutrin. Emotional eating behavior strategies were discussed today to help Alex [name] deal with Alex [name] emotional/non-hunger eating behaviors. Orders and follow up as documented in patient record.   2. Obesity with current BMI 43.0 Alex [name] is currently in the action stage of change. As such, Alex [name] goal is to continue with weight loss efforts. Alex [name] has agreed to the Category 3 Plan.   Exercise goals: As is.  Behavioral modification strategies: holiday eating strategies .  Alex [name] has agreed to follow-up with our clinic in 2 to 3 weeks. Alex [name] was informed of the importance of frequent follow-up visits to maximize Alex [name] success with intensive lifestyle modifications for Alex [name] multiple health  conditions.   Objective:   Blood pressure 120/73, pulse 69, temperature 98.3 F (36.8 C), height 5\' 10"  (1.778 m), weight 299 lb (135.6 kg), SpO2 98 %. Body mass index is 42.9 kg/m.  General: Cooperative, alert, well developed, in no acute distress. HEENT: Conjunctivae and lids unremarkable. Cardiovascular: Regular rhythm.  Lungs: Normal work of breathing. Neurologic: No focal deficits.   Lab Results  Component Value Date   CREATININE 0.87 06/25/2021   BUN 13 06/25/2021   NA 140 06/25/2021   K 4.7 06/25/2021   CL 103 06/25/2021   CO2 18 (L) 06/25/2021   Lab Results  Component Value Date   ALT 36 06/25/2021   AST 28 06/25/2021   ALKPHOS 82 06/25/2021   BILITOT 0.5 06/25/2021   Lab Results  Component Value Date   HGBA1C 5.0 06/25/2021   Lab Results  Component Value Date   INSULIN 6.5 06/25/2021   Lab Results  Component Value Date   TSH 1.820 06/25/2021   Lab Results  Component Value Date   CHOL 163 06/13/2021   HDL 48 06/13/2021   LDLCALC 102 (H) 06/13/2021   TRIG 69 06/13/2021   CHOLHDL 3.2 03/07/2019   Lab Results  Component Value Date   VD25OH 33.0 06/25/2021   Lab Results  Component Value Date   WBC 5.8 06/25/2021   HGB 14.3 06/25/2021   HCT 43.5 06/25/2021   MCV 85 06/25/2021   PLT 218 06/05/2021   No results found for: IRON, TIBC, FERRITIN  Attestation Statements:   Reviewed by clinician on day of visit: allergies, medications, problem list, medical history, surgical history, family history, social history, and previous  encounter notes.  Time spent on visit including pre-visit chart review and post-visit care and charting was 32 minutes.    I, Alex [name], am acting as transcriptionist for Alex Nip, MD.  I have reviewed the above documentation for accuracy and completeness, and I agree with the above. -  Alex Nip, MD

## 2021-09-05 ENCOUNTER — Telehealth: Payer: Self-pay | Admitting: *Deleted

## 2021-09-05 DIAGNOSIS — R079 Chest pain, unspecified: Secondary | ICD-10-CM

## 2021-09-09 ENCOUNTER — Telehealth (INDEPENDENT_AMBULATORY_CARE_PROVIDER_SITE_OTHER): Payer: 59 | Admitting: Psychology

## 2021-09-09 ENCOUNTER — Other Ambulatory Visit (HOSPITAL_COMMUNITY): Payer: Self-pay

## 2021-09-09 ENCOUNTER — Ambulatory Visit (INDEPENDENT_AMBULATORY_CARE_PROVIDER_SITE_OTHER): Payer: 59

## 2021-09-09 ENCOUNTER — Other Ambulatory Visit: Payer: Self-pay

## 2021-09-09 DIAGNOSIS — F3289 Other specified depressive episodes: Secondary | ICD-10-CM

## 2021-09-09 DIAGNOSIS — R079 Chest pain, unspecified: Secondary | ICD-10-CM

## 2021-09-09 DIAGNOSIS — F419 Anxiety disorder, unspecified: Secondary | ICD-10-CM | POA: Diagnosis not present

## 2021-09-09 LAB — EXERCISE TOLERANCE TEST
Angina Index: 0
Duke Treadmill Score: 4
Estimated workload: 10.4
Exercise duration (min): 9 min
Exercise duration (sec): 0 s
MPHR: 183 {beats}/min
Peak HR: 166 {beats}/min
Percent HR: 90 %
RPE: 17
Rest HR: 76 {beats}/min
ST Depression (mm): 1 mm

## 2021-09-09 NOTE — Progress Notes (Signed)
  Office: [phone]  /  Fax: [phone]    Date: September 22, 2021   Appointment Start Time: 2:31pm Duration: 30 minutes Provider: Glennie Isle, Psy.D. Type of Session: Individual Therapy  Location of Patient: Home (private location) Location of Provider: Provider's Home (private office) Type of Contact: Telepsychological Visit via MyChart Video Visit  Session Content: Alex [name] is a 40 y.o. adult presenting for a follow-up appointment to address the previously established treatment goal of increasing coping skills.Today's appointment was a telepsychological visit due to COVID-19. Alex [name] provided verbal consent for today's telepsychological appointment and Alex [name] is aware Alex [name] is responsible for securing confidentiality on Alex [name] end of the session. Prior to proceeding with today's appointment, Alex [name]'s physical location at the time of this appointment was obtained as well a phone number Alex [name] could be reached at in the event of technical difficulties. Alex [name] and this provider participated in today's telepsychological service.   This provider conducted a brief check-in. Alex [name] shared about recent events, including celebrating Thanksgiving and birthday. He discussed making better choices and engaging in portion control, adding a reduction in emotional eating behaviors. Notably, Alex [name] described feeling in control for the first time with his eating habits. Further explored and processed. Reviewed self-compassion. He discussed regularly asking himself what he needs in the moment. Positive reinforcement was provided. To further increase understanding of self-compassion, this provider and Alex [name] discussed self-kindness, common humanity, and mindfulness. Overall, Alex [name] was receptive to today's appointment as evidenced by openness to sharing, responsiveness to feedback, and willingness to continue engaging in learned skills.  Mental Status Examination:   Appearance: well groomed and appropriate hygiene  Behavior: appropriate to circumstances Mood: euthymic Affect: mood congruent Speech: normal in rate, volume, and tone Eye Contact: appropriate Psychomotor Activity: appropriate Gait: unable to assess Thought Process: linear, logical, and goal directed  Thought Content/Perception: no hallucinations, delusions, bizarre thinking or behavior reported or observed and no evidence or endorsement of suicidal and homicidal ideation, plan, and intent Orientation: time, person, place, and purpose of appointment Memory/Concentration: memory, attention, language, and fund of knowledge intact  Insight/Judgment: good  Interventions:  Conducted a brief chart review Provided empathic reflections and validation Reviewed content from the previous session Provided positive reinforcement Employed supportive psychotherapy interventions to facilitate reduced distress and to improve coping skills with identified stressors Provided further psychoeducation regarding self-compassion  DSM-5 Diagnosis(es): F32.89 Other Specified Depressive Disorder, Emotional Eating Behaviors and F41.9 Unspecified Anxiety Disorder  Treatment Goal & Progress: During the initial appointment with this provider, the following treatment goal was established: increase coping skills. Alex [name] has demonstrated progress in Alex [name] goal as evidenced by increased awareness of hunger patterns, increased awareness of triggers for emotional eating behaviors, and reduction in emotional eating behaviors . Alex [name] also continues to demonstrate willingness to engage in learned skill(s).  Plan: The next appointment will be scheduled in two weeks, which will be via MyChart Video Visit. The next session will focus on working towards the established treatment goal.

## 2021-09-10 ENCOUNTER — Other Ambulatory Visit: Payer: Self-pay

## 2021-09-10 ENCOUNTER — Other Ambulatory Visit (HOSPITAL_COMMUNITY): Payer: Self-pay

## 2021-09-10 DIAGNOSIS — J45990 Exercise induced bronchospasm: Secondary | ICD-10-CM

## 2021-09-10 MED ORDER — ALBUTEROL SULFATE HFA 108 (90 BASE) MCG/ACT IN AERS
1.0000 | INHALATION_SPRAY | Freq: Four times a day (QID) | RESPIRATORY_TRACT | 11 refills | Status: DC | PRN
  Filled 2021-09-10 – 2021-09-29 (×2): qty 8.5, 25d supply, fill #0

## 2021-09-11 ENCOUNTER — Other Ambulatory Visit (HOSPITAL_COMMUNITY): Payer: Self-pay

## 2021-09-22 ENCOUNTER — Telehealth (INDEPENDENT_AMBULATORY_CARE_PROVIDER_SITE_OTHER): Payer: 59 | Admitting: Psychology

## 2021-09-22 ENCOUNTER — Other Ambulatory Visit (HOSPITAL_COMMUNITY): Payer: Self-pay

## 2021-09-22 DIAGNOSIS — F419 Anxiety disorder, unspecified: Secondary | ICD-10-CM

## 2021-09-22 DIAGNOSIS — F3289 Other specified depressive episodes: Secondary | ICD-10-CM | POA: Diagnosis not present

## 2021-09-22 NOTE — Progress Notes (Signed)
  Office: [phone]  /  Fax: [phone]    Date: September 06, 2021   Appointment Start Time: 9:04am Duration: 23 minutes Provider: Glennie Isle, Psy.D. Type of Session: Individual Therapy  Location of Patient: Home (private location) Location of Provider: Provider's Home (private office) Type of Contact: Telepsychological Visit via MyChart Video Visit  Session Content: Alex [name] is a 40 y.o. adult presenting for a follow-up appointment to address the previously established treatment goal of increasing coping skills.Today's appointment was a telepsychological visit due to COVID-19. Alex [name] provided verbal consent for today's telepsychological appointment and CRISTOFHER LIVECCHI is aware MAXTON NOREEN is responsible for securing confidentiality on KIREE DEJARNETTE end of the session. Prior to proceeding with today's appointment, Brylan's physical location at the time of this appointment was obtained as well a phone number MICKLE CAMPTON could be reached at in the event of technical difficulties. Alex [name] and this provider participated in today's telepsychological service.   This provider conducted a brief check-in. Bastion reported he has been thinking more about self-compassion and reflecting on his "inner voice." He discussed being more in the moment and also shared eating is going overall well. Positive reinforcement was provided. Psychoeducation regarding mindfulness was provided to further assist with coping. A handout was provided to Alex [name] with further information regarding mindfulness, including exercises. This provider also explained the benefit of mindfulness as it relates to emotional eating. Demario was encouraged to engage in the provided exercises between now and the next appointment with this provider. Alex [name] agreed. During today's appointment, [name] was led through a mindfulness exercise involving his senses. Alex [name] provided verbal consent during today's appointment for this  provider to send a handout about mindfulness via e-mail. Overall, [name] was receptive to today's appointment as evidenced by openness to sharing, responsiveness to feedback, and willingness to engage in mindfulness exercises to assist with coping.  Mental Status Examination:  Appearance: well groomed and appropriate hygiene  Behavior: appropriate to circumstances Mood: neutral Affect: mood congruent Speech: WNL Eye Contact: appropriate Psychomotor Activity: WNL Gait: unable to assess Thought Process: linear, logical, and goal directed and no evidence or endorsement of suicidal, homicidal, and self-harm ideation, plan and intent  Thought Content/Perception: no hallucinations, delusions, bizarre thinking or behavior endorsed or observed Orientation: AAOx4 Memory/Concentration: memory, attention, language, and fund of knowledge intact  Insight/Judgment: good  Interventions:  Conducted a brief chart review Provided empathic reflections and validation Reviewed content from the previous session Provided positive reinforcement Employed supportive psychotherapy interventions to facilitate reduced distress and to improve coping skills with identified stressors Psychoeducation provided regarding mindfulness Engaged patient in mindfulness exercise(s)  DSM-5 Diagnosis(es): F32.89 Other Specified Depressive Disorder, Emotional Eating Behaviors and F41.9 Unspecified Anxiety Disorder  Treatment Goal & Progress: During the initial appointment with this provider, the following treatment goal was established: increase coping skills. Jakory has demonstrated progress in EFTON THOMLEY goal as evidenced by increased awareness of hunger patterns, increased awareness of triggers for emotional eating behaviors, and reduction in emotional eating behaviors . Bodey also continues to demonstrate willingness to engage in learned skill(s).  Plan: The next appointment will be scheduled in approximately one  week, which will be via MyChart Video Visit. The next session will focus on working towards the established treatment goal.

## 2021-09-22 NOTE — Telephone Encounter (Signed)
Attestation order 

## 2021-09-23 ENCOUNTER — Ambulatory Visit (INDEPENDENT_AMBULATORY_CARE_PROVIDER_SITE_OTHER): Payer: 59 | Admitting: Family Medicine

## 2021-09-24 ENCOUNTER — Other Ambulatory Visit: Payer: Self-pay

## 2021-09-24 ENCOUNTER — Ambulatory Visit (INDEPENDENT_AMBULATORY_CARE_PROVIDER_SITE_OTHER): Payer: 59 | Admitting: Family Medicine

## 2021-09-24 ENCOUNTER — Encounter (INDEPENDENT_AMBULATORY_CARE_PROVIDER_SITE_OTHER): Payer: Self-pay | Admitting: Family Medicine

## 2021-09-24 VITALS — BP 130/73 | HR 72 | Temp 98.7°F | Ht 70.0 in | Wt 300.0 lb

## 2021-09-24 DIAGNOSIS — F419 Anxiety disorder, unspecified: Secondary | ICD-10-CM

## 2021-09-24 DIAGNOSIS — E559 Vitamin D deficiency, unspecified: Secondary | ICD-10-CM

## 2021-09-24 DIAGNOSIS — F32A Depression, unspecified: Secondary | ICD-10-CM

## 2021-09-24 DIAGNOSIS — Z6841 Body Mass Index (BMI) 40.0 and over, adult: Secondary | ICD-10-CM | POA: Diagnosis not present

## 2021-09-24 NOTE — Progress Notes (Signed)
Chief Complaint:   OBESITY Alex [name] is here to discuss Alex [name] progress with Alex [name] obesity treatment plan along with follow-up of Alex [name] obesity related diagnoses. [name] is on the Category 3 Plan and states Alex [name] is following Alex [name] eating plan approximately 60% of the time. Alex [name] states Alex [name] is at the gym, doing weights and cardio for 60 minutes 3 times per week.  Today's visit was #: 7 Starting weight: 311 lbs Starting date: 06/25/2021 Today's weight: 300 lbs Today's date: 09/24/2021 Total lbs lost to date: 11 Total lbs lost since last in-office visit: 0  Interim History: Alex [name] did well with minimizing holiday weight gain. He was able to portion control and avoided taking home leftovers. He has increased his exercise and he is doing an extra 100 calorie snack with protein on days he is exercising.  Subjective:   1. Vitamin D deficiency Alex [name] is stable on Vit D, and he denies nausea, vomiting, or muscle weakness.  2. Anxiety and depression Alex [name]'s mood is doing well. He is seeing Dr. Mallie [name] and appears to be more at "peace". He has no problems with his medications.  Assessment/Plan:   1. Vitamin D deficiency Alex [name] agreed to continue prescription Vitamin D 50,000 IU every week, and we will recheck labs in 1 month. He will follow-up for routine testing of Vitamin D, at least 2-3 times per year to avoid over-replacement.  2. Anxiety and depression Alex [name] will continue to monitor, and we discussed strategies to help reduce emotional eating behaviors. Orders and follow up as documented in patient record.   3. Obesity with current BMI of 43.1 Alex [name] is currently in the action stage of change. As such, Alex [name] goal is to continue with weight loss efforts. Alex [name] has agreed to the Category 3 Plan.   Exercise goals: As is.  Behavioral modification strategies: increasing lean  protein intake and meal planning and cooking strategies.  [name] has agreed to follow-up with our clinic in 2 weeks. Alex [name] was informed of the importance of frequent follow-up visits to maximize [name] success with intensive lifestyle modifications for [name] multiple health conditions.   Objective:   Blood pressure 130/73, pulse 72, temperature 98.7 F (37.1 C), height 5\' 10"  (1.778 m), weight 300 lb (136.1 kg), SpO2 98 %. Body mass index is 43.05 kg/m.  General: Cooperative, alert, well developed, in no acute distress. HEENT: Conjunctivae and lids unremarkable. Cardiovascular: Regular rhythm.  Lungs: Normal work of breathing. Neurologic: No focal deficits.   Lab Results  Component Value Date   CREATININE 0.87 06/25/2021   BUN 13 06/25/2021   NA 140 06/25/2021   K 4.7 06/25/2021   CL 103 06/25/2021   CO2 18 (L) 06/25/2021   Lab Results  Component Value Date   ALT 36 06/25/2021   AST 28 06/25/2021   ALKPHOS 82 06/25/2021   BILITOT 0.5 06/25/2021   Lab Results  Component Value Date   HGBA1C 5.0 06/25/2021   Lab Results  Component Value Date   INSULIN 6.5 06/25/2021   Lab Results  Component Value Date   TSH 1.820 06/25/2021   Lab Results  Component Value Date   CHOL 163 06/13/2021   HDL 48 06/13/2021   LDLCALC 102 (H) 06/13/2021   TRIG 69 06/13/2021   CHOLHDL 3.2 03/07/2019   Lab Results  Component Value Date   VD25OH 33.0 06/25/2021  Lab Results  Component Value Date   WBC 5.8 06/25/2021   HGB 14.3 06/25/2021   HCT 43.5 06/25/2021   MCV 85 06/25/2021   PLT 218 06/05/2021   No results found for: IRON, TIBC, FERRITIN  Attestation Statements:   Reviewed by clinician on day of visit: allergies, medications, problem list, medical history, surgical history, family history, social history, and previous encounter notes.  Time spent on visit including pre-visit chart review and post-visit care and charting was 23 minutes.     I, Trixie Dredge, am acting as transcriptionist for [name], MD.  I have reviewed the above documentation for accuracy and completeness, and I agree with the above. -  [name], MD

## 2021-09-29 ENCOUNTER — Other Ambulatory Visit (HOSPITAL_COMMUNITY): Payer: Self-pay

## 2021-10-06 ENCOUNTER — Telehealth (INDEPENDENT_AMBULATORY_CARE_PROVIDER_SITE_OTHER): Payer: 59 | Admitting: Psychology

## 2021-10-06 DIAGNOSIS — F419 Anxiety disorder, unspecified: Secondary | ICD-10-CM | POA: Diagnosis not present

## 2021-10-06 DIAGNOSIS — F3289 Other specified depressive episodes: Secondary | ICD-10-CM

## 2021-10-06 NOTE — Progress Notes (Signed)
Office: [phone]  /  Fax: [phone]    Date: October 14, 2021   Appointment Start Time: 11:06am Duration: 21 minutes Provider: Glennie Isle, Psy.D. Type of Session: Individual Therapy  Location of Patient: Home (private location) Location of Provider: Provider's Home (private office) Type of Contact: Telepsychological Visit via MyChart Video Visit  Session Content: This provider called Alex [name] at 11:04am as Alex [name] did not present for today's appointment. He reported he lost track of time, but would join today's appointment. As such, today's appointment was initiated 6 minutes late.Alex [name] is a 40 y.o. adult presenting for a follow-up appointment to address the previously established treatment goal of increasing coping skills.Today's appointment was a telepsychological visit due to COVID-19. Alex [name] provided verbal consent for today's telepsychological appointment and Alex [name] is aware Alex [name] is responsible for securing confidentiality on Alex [name] end of the session. Prior to proceeding with today's appointment, Alex [name]'s physical location at the time of this appointment was obtained as well a phone number Alex [name] could be reached at in the event of technical difficulties. Alex [name] and this provider participated in today's telepsychological service.   This provider conducted a brief check-in. Alex [name] reported he has been busy with work and holiday preparations. He recalled having a "really bad mental health day on Sunday." Further explored and processed. He discussed utilizing learned coping skills. Positive reinforcement was provided. Session focused further on mindfulness to assist with coping. He discussed engaging in shared exercises. Alex [name] was led through a mindfulness exercise (A Tastes of Mindfulness) and Alex [name] experience was processed. Alex [name] provided verbal consent during today's appointment for this provider to send the  handout for today's exercise via e-mail. This provider also discussed the utilization of YouTube for mindfulness exercises (e.g., exercises by Alex [name]). Furthermore, termination planning was discussed. Alex [name] was receptive to a follow-up appointment in 3-4 weeks and an additional follow-up/termination appointment in 3-4 weeks after that. Overall, Alex [name] was receptive to today's appointment as evidenced by openness to sharing, responsiveness to feedback, and willingness to continue engaging in mindfulness exercises.  Mental Status Examination:  Appearance: well groomed and appropriate hygiene  Behavior: appropriate to circumstances Mood: neutral Affect: mood congruent Speech: WNL Eye Contact: appropriate Psychomotor Activity: WNL Gait: unable to assess Thought Process: linear, logical, and goal directed and denies suicidal, homicidal, and self-harm ideation, plan and intent  Thought Content/Perception: no hallucinations, delusions, bizarre thinking or behavior endorsed or observed Orientation: AAOx4 Memory/Concentration: memory, attention, language, and fund of knowledge intact  Insight/Judgment: good  Interventions:  Conducted a brief chart review Conducted a risk assessment Provided empathic reflections and validation Reviewed content from the previous session Employed supportive psychotherapy interventions to facilitate reduced distress and to improve coping skills with identified stressors Engaged patient in mindfulness exercise(s) Discussed termination planning  DSM-5 Diagnosis(es): F32.89 Other Specified Depressive Disorder, Emotional Eating Behaviors and F41.9 Unspecified Anxiety Disorder  Treatment Goal & Progress: During the initial appointment with this provider, the following treatment goal was established: increase coping skills. Alex [name] has demonstrated progress in Alex [name] goal as evidenced by increased awareness of hunger patterns, increased awareness of  triggers for emotional eating behaviors, and reduction in emotional eating behaviors . Alex [name] also continues to demonstrate willingness to engage in learned skill(s).  Plan: Based on Alex [name]'s work schedule, the next appointment will be scheduled in six weeks, which will be via [place] Visit. The next session will focus on working towards the established treatment goal.  Alex [name] will continue to meet with his primary therapist.

## 2021-10-08 ENCOUNTER — Other Ambulatory Visit: Payer: Self-pay

## 2021-10-08 ENCOUNTER — Encounter (INDEPENDENT_AMBULATORY_CARE_PROVIDER_SITE_OTHER): Payer: Self-pay | Admitting: Family Medicine

## 2021-10-08 ENCOUNTER — Ambulatory Visit (INDEPENDENT_AMBULATORY_CARE_PROVIDER_SITE_OTHER): Payer: 59 | Admitting: Family Medicine

## 2021-10-08 VITALS — BP 113/71 | HR 67 | Temp 98.3°F | Ht 70.0 in | Wt 295.0 lb

## 2021-10-08 DIAGNOSIS — Z6841 Body Mass Index (BMI) 40.0 and over, adult: Secondary | ICD-10-CM | POA: Diagnosis not present

## 2021-10-08 DIAGNOSIS — J4520 Mild intermittent asthma, uncomplicated: Secondary | ICD-10-CM

## 2021-10-08 NOTE — Progress Notes (Signed)
Chief Complaint:   OBESITY Jaryn is here to discuss BRYNN REZNIK progress with [name] obesity treatment plan along with follow-up of JAFARI MCKILLOP obesity related diagnoses. Ulmer is on the Category 3 Plan and states STELLAN VICK is following DEAVIN FORST eating plan approximately 60% of the time. Dez states KYHEEM BATHGATE is at the gym for 60 minutes 3 times per week.  Today's visit was #: 8 Starting weight: 311 lbs Starting date: 06/25/2021 Today's weight: 295 lbs Today's date: 10/08/2021 Total lbs lost to date: 16 Total lbs lost since last in-office visit: 5  Interim History: Emoni continues to do very well with weight loss. He notes some increase in temptations especially at work. He has increased his exercise and his sleep has improved. He is doing well with meeting his protein goals.  Subjective:   1. Mild intermittent asthma, unspecified whether complicated Toriano noted some increase in symptoms when the heat is turned on this year. He uses albuterol before cardio exercises. He is doing well now.  Assessment/Plan:   1. Mild intermittent asthma, unspecified whether complicated Kailyn will continue his treatment as is, and will watch for signs of exacerbation.  2. Obesity with current BMI of 42.4 Falcon is currently in the action stage of change. As such, JAVIOUS HALLISEY goal is to continue with weight loss efforts. TYJAY GALINDO has agreed to the Category 3 Plan.   Exercise goals: As is.  Behavioral modification strategies: increasing lean protein intake, no skipping meals, and dealing with family or coworker sabotage.  Sorren has agreed to follow-up with our clinic in 3 weeks. LEDARRIUS BEAUCHAINE was informed of the importance of frequent follow-up visits to maximize ZYIERE ROSEMOND success with intensive lifestyle modifications for FORTINO HAAG multiple health conditions.   Objective:   Blood pressure 113/71, pulse  67, temperature 98.3 F (36.8 C), height 5\' 10"  (1.778 m), weight 295 lb (133.8 kg), SpO2 97 %. Body mass index is 42.33 kg/m.  General: Cooperative, alert, well developed, in no acute distress. HEENT: Conjunctivae and lids unremarkable. Cardiovascular: Regular rhythm.  Lungs: Normal work of breathing. Neurologic: No focal deficits.   Lab Results  Component Value Date   CREATININE 0.87 06/25/2021   BUN 13 06/25/2021   NA 140 06/25/2021   K 4.7 06/25/2021   CL 103 06/25/2021   CO2 18 (L) 06/25/2021   Lab Results  Component Value Date   ALT 36 06/25/2021   AST 28 06/25/2021   ALKPHOS 82 06/25/2021   BILITOT 0.5 06/25/2021   Lab Results  Component Value Date   HGBA1C 5.0 06/25/2021   Lab Results  Component Value Date   INSULIN 6.5 06/25/2021   Lab Results  Component Value Date   TSH 1.820 06/25/2021   Lab Results  Component Value Date   CHOL 163 06/13/2021   HDL 48 06/13/2021   LDLCALC 102 (H) 06/13/2021   TRIG 69 06/13/2021   CHOLHDL 3.2 03/07/2019   Lab Results  Component Value Date   VD25OH 33.0 06/25/2021   Lab Results  Component Value Date   WBC 5.8 06/25/2021   HGB 14.3 06/25/2021   HCT 43.5 06/25/2021   MCV 85 06/25/2021   PLT 218 06/05/2021   No results found for: IRON, TIBC, FERRITIN  Attestation Statements:   Reviewed by clinician on day of visit: allergies, medications, problem list, medical history, surgical history, family history, social history, and previous encounter notes.  Time spent on visit including pre-visit chart review and post-visit care and charting was 31 minutes.    I, Trixie Dredge, am acting as transcriptionist for [name], MD.  I have reviewed the above documentation for accuracy and completeness, and I agree with the above. -  [name], MD

## 2021-10-13 ENCOUNTER — Other Ambulatory Visit (HOSPITAL_COMMUNITY): Payer: Self-pay

## 2021-10-14 ENCOUNTER — Telehealth (INDEPENDENT_AMBULATORY_CARE_PROVIDER_SITE_OTHER): Payer: 59 | Admitting: Psychology

## 2021-10-14 DIAGNOSIS — F3289 Other specified depressive episodes: Secondary | ICD-10-CM

## 2021-10-14 DIAGNOSIS — F419 Anxiety disorder, unspecified: Secondary | ICD-10-CM | POA: Diagnosis not present

## 2021-10-25 ENCOUNTER — Encounter: Payer: Self-pay | Admitting: Family Medicine

## 2021-10-25 ENCOUNTER — Telehealth: Payer: 59 | Admitting: Physician Assistant

## 2021-10-25 ENCOUNTER — Encounter (INDEPENDENT_AMBULATORY_CARE_PROVIDER_SITE_OTHER): Payer: Self-pay | Admitting: Family Medicine

## 2021-10-25 DIAGNOSIS — U071 COVID-19: Secondary | ICD-10-CM

## 2021-10-25 MED ORDER — MOLNUPIRAVIR EUA 200MG CAPSULE: 4.0000 | ORAL_CAPSULE | Freq: Two times a day (BID) | ORAL | 0 refills | Status: AC

## 2021-10-25 NOTE — Addendum Note (Signed)
Addended by: Brunetta Jeans on: 10/25/2021 06:44 PM   Modules accepted: Orders

## 2021-10-25 NOTE — Patient Instructions (Signed)
Alex [name], thank you for joining Alex Rio, PA-C for today's virtual visit.  While this provider is not your primary care provider (PCP), if your PCP is located in our provider database this encounter information will be shared with them immediately following your visit.  Consent: (Patient) Alex [name] provided verbal consent for this virtual visit at the beginning of the encounter.  Current Medications:  Current Outpatient Medications:    albuterol (VENTOLIN HFA) 108 (90 Base) MCG/ACT inhaler, Inhale 1-2 puffs into the lungs every 6 (six) hours as needed for wheezing or shortness of breath., Disp: 8.5 g, Rfl: 11   buPROPion (WELLBUTRIN XL) 300 MG 24 hr tablet, Take 1 tablet (300 mg total) by mouth daily., Disp: 30 tablet, Rfl: 2   busPIRone (BUSPAR) 15 MG tablet, Take 1 tablet (15 mg total) by mouth 3 (three) times daily for anxiety, Disp: 90 tablet, Rfl: 2   Fexofenadine HCl (ALLEGRA PO), Take by mouth., Disp: , Rfl:    hydrOXYzine (ATARAX/VISTARIL) 25 MG tablet, Take 1 tablet (25 mg total) by mouth 3 (three) times daily as needed for anxiety., Disp: 60 tablet, Rfl: 0   Multiple Vitamin (MULTIVITAMIN) tablet, Take 1 tablet by mouth daily., Disp: , Rfl:    Probiotic Product (PROBIOTIC DAILY PO), Take by mouth., Disp: , Rfl:    Vitamin D, Ergocalciferol, (DRISDOL) 1.25 MG (50000 UNIT) CAPS capsule, Take 1 capsule (50,000 Units total) by mouth every 7 (seven) days., Disp: 4 capsule, Rfl: 0   Medications ordered in this encounter:  No orders of the defined types were placed in this encounter.    *If you need refills on other medications prior to your next appointment, please contact your pharmacy*  Follow-Up: Call back or seek an in-person evaluation if the symptoms worsen or if the condition fails to improve as anticipated.  Other Instructions Please keep well-hydrated and get plenty of rest. Start a saline nasal rinse to flush out your nasal passages. You can use  plain Mucinex to help thin congestion. If you have a humidifier, running in the bedroom at night. I want you to start OTC vitamin D3 1000 units daily, vitamin C 1000 mg daily, and a zinc supplement. Please take prescribed medications as directed.  You have been enrolled in a MyChart symptom monitoring program. Please answer these questions daily so we can keep track of how you are doing.  You were to quarantine for 5 days from onset of your symptoms.  After day 5, if you have had no fever and you are feeling better, you can end quarantine but need to mask for an additional 5 days. After day 5 if you have a fever or are having significant symptoms, please quarantine for full 10 days.  If you note any worsening of symptoms, any significant shortness of breath or any chest pain, please seek ER evaluation ASAP.  Please do not delay care!  COVID-19: What to Do if You Are Sick If you test positive and are an older adult or someone who is at high risk of getting very sick from COVID-19, treatment may be available. Contact a healthcare provider right away after a positive test to determine if you are eligible, even if your symptoms are mild right now. You can also visit a Test to Treat location and, if eligible, receive a prescription from a provider. Don't delay: Treatment must be started within the first few days to be effective. If you have a fever, cough, or other symptoms,  you might have COVID-19. Most people have mild illness and are able to recover at home. If you are sick: Keep track of your symptoms. If you have an emergency warning sign (including trouble breathing), call 911. Steps to help prevent the spread of COVID-19 if you are sick If you are sick with COVID-19 or think you might have COVID-19, follow the steps below to care for yourself and to help protect other people in your home and community. Stay home except to get medical care Stay home. Most people with COVID-19 have mild illness  and can recover at home without medical care. Do not leave your home, except to get medical care. Do not visit public areas and do not go to places where you are unable to wear a mask. Take care of yourself. Get rest and stay hydrated. Take over-the-counter medicines, such as acetaminophen, to help you feel better. Stay in touch with your doctor. Call before you get medical care. Be sure to get care if you have trouble breathing, or have any other emergency warning signs, or if you think it is an emergency. Avoid public transportation, ride-sharing, or taxis if possible. Get tested If you have symptoms of COVID-19, get tested. While waiting for test results, stay away from others, including staying apart from those living in your household. Get tested as soon as possible after your symptoms start. Treatments may be available for people with COVID-19 who are at risk for becoming very sick. Don't delay: Treatment must be started early to be effective--some treatments must begin within 5 days of your first symptoms. Contact your healthcare provider right away if your test result is positive to determine if you are eligible. Self-tests are one of several options for testing for the virus that causes COVID-19 and may be more convenient than laboratory-based tests and point-of-care tests. Ask your healthcare provider or your local health department if you need help interpreting your test results. You can visit your state, tribal, local, and territorial health department's website to look for the latest local information on testing sites. Separate yourself from other people As much as possible, stay in a specific room and away from other people and pets in your home. If possible, you should use a separate bathroom. If you need to be around other people or animals in or outside of the home, wear a well-fitting mask. Tell your close contacts that they may have been exposed to COVID-19. An infected person can  spread COVID-19 starting 48 hours (or 2 days) before the person has any symptoms or tests positive. By letting your close contacts know they may have been exposed to COVID-19, you are helping to protect everyone. See COVID-19 and Animals if you have questions about pets. If you are diagnosed with COVID-19, someone from the health department may call you. Answer the call to slow the spread. Monitor your symptoms Symptoms of COVID-19 include fever, cough, or other symptoms. Follow care instructions from your healthcare provider and local health department. Your local health authorities may give instructions on checking your symptoms and reporting information. When to seek emergency medical attention Look for emergency warning signs* for COVID-19. If someone is showing any of these signs, seek emergency medical care immediately: Trouble breathing Persistent pain or pressure in the chest New confusion Inability to wake or stay awake Pale, gray, or blue-colored skin, lips, or nail beds, depending on skin tone *This list is not all possible symptoms. Please call your medical provider for any other symptoms  that are severe or concerning to you. Call 911 or call ahead to your local emergency facility: Notify the operator that you are seeking care for someone who has or may have COVID-19. Call ahead before visiting your doctor Call ahead. Many medical visits for routine care are being postponed or done by phone or telemedicine. If you have a medical appointment that cannot be postponed, call your doctor's office, and tell them you have or may have COVID-19. This will help the office protect themselves and other patients. If you are sick, wear a well-fitting mask You should wear a mask if you must be around other people or animals, including pets (even at home). Wear a mask with the best fit, protection, and comfort for you. You don't need to wear the mask if you are alone. If you can't put on a mask  (because of trouble breathing, for example), cover your coughs and sneezes in some other way. Try to stay at least 6 feet away from other people. This will help protect the people around you. Masks should not be placed on young children under age 28 years, anyone who has trouble breathing, or anyone who is not able to remove the mask without help. Cover your coughs and sneezes Cover your mouth and nose with a tissue when you cough or sneeze. Throw away used tissues in a lined trash can. Immediately wash your hands with soap and water for at least 20 seconds. If soap and water are not available, clean your hands with an alcohol-based hand sanitizer that contains at least 60% alcohol. Clean your hands often Wash your hands often with soap and water for at least 20 seconds. This is especially important after blowing your nose, coughing, or sneezing; going to the bathroom; and before eating or preparing food. Use hand sanitizer if soap and water are not available. Use an alcohol-based hand sanitizer with at least 60% alcohol, covering all surfaces of your hands and rubbing them together until they feel dry. Soap and water are the best option, especially if hands are visibly dirty. Avoid touching your eyes, nose, and mouth with unwashed hands. Handwashing Tips Avoid sharing personal household items Do not share dishes, drinking glasses, cups, eating utensils, towels, or bedding with other people in your home. Wash these items thoroughly after using them with soap and water or put in the dishwasher. Clean surfaces in your home regularly Clean and disinfect high-touch surfaces (for example, doorknobs, tables, handles, light switches, and countertops) in your "sick room" and bathroom. In shared spaces, you should clean and disinfect surfaces and items after each use by the person who is ill. If you are sick and cannot clean, a caregiver or other person should only clean and disinfect the area around you  (such as your bedroom and bathroom) on an as needed basis. Your caregiver/other person should wait as long as possible (at least several hours) and wear a mask before entering, cleaning, and disinfecting shared spaces that you use. Clean and disinfect areas that may have blood, stool, or body fluids on them. Use household cleaners and disinfectants. Clean visible dirty surfaces with household cleaners containing soap or detergent. Then, use a household disinfectant. Use a product from [employer] List N: Disinfectants for Coronavirus (TGYBW-38). Be sure to follow the instructions on the label to ensure safe and effective use of the product. Many products recommend keeping the surface wet with a disinfectant for a certain period of time (look at "contact time" on the product  label). You may also need to wear personal protective equipment, such as gloves, depending on the directions on the product label. Immediately after disinfecting, wash your hands with soap and water for 20 seconds. For completed guidance on cleaning and disinfecting your home, visit Complete Disinfection Guidance. Take steps to improve ventilation at home Improve ventilation (air flow) at home to help prevent from spreading COVID-19 to other people in your household. Clear out COVID-19 virus particles in the air by opening windows, using air filters, and turning on fans in your home. Use this interactive tool to learn how to improve air flow in your home. When you can be around others after being sick with COVID-19 Deciding when you can be around others is different for different situations. Find out when you can safely end home isolation. For any additional questions about your care, contact your healthcare provider or state or local health department. 01/14/2021 Content source: [facility] ([facility]), Division of Viral Diseases This information is not intended to replace advice given to you  by your health care provider. Make sure you discuss any questions you have with your health care provider. Document Revised: 02/27/2021 Document Reviewed: 02/27/2021 Elsevier Patient Education  2022 [employer].      If you have been instructed to have an in-person evaluation today at a local Urgent Care facility, please use the link below. It will take you to a list of all of our available [facility], including address, phone number and hours of operation. Please do not delay care.  [facility]  If you or a family member do not have a primary care provider, use the link below to schedule a visit and establish care. When you choose a [facility] primary care physician or advanced practice provider, you gain a long-term partner in health. Find a Primary Care Provider  Learn more about [facility] in-office and virtual care options: [facility]

## 2021-10-25 NOTE — Progress Notes (Signed)
Virtual Visit Consent   Alex [name], you are scheduled for a virtual visit with a [facility] provider today.     Just as with appointments in the office, your consent must be obtained to participate.  Your consent will be active for this visit and any virtual visit you may have with one of our providers in the next 365 days.     If you have a MyChart account, a copy of this consent can be sent to you electronically.  All virtual visits are billed to your insurance company just like a traditional visit in the office.    As this is a virtual visit, video technology does not allow for your provider to perform a traditional examination.  This may limit your provider's ability to fully assess your condition.  If your provider identifies any concerns that need to be evaluated in person or the need to arrange testing (such as labs, EKG, etc.), we will make arrangements to do so.     Although advances in technology are sophisticated, we cannot ensure that it will always work on either your end or our end.  If the connection with a video visit is poor, the visit may have to be switched to a telephone visit.  With either a video or telephone visit, we are not always able to ensure that we have a secure connection.     I need to obtain your verbal consent now.   Are you willing to proceed with your visit today?    Alex [name] has provided verbal consent on 10/25/2021 for a virtual visit (video or telephone).   [name], [place]   Date: 10/25/2021 5:48 PM   Virtual Visit via Video Note   I, [name], connected with  Alex [name]  ([id number], 01/27/1981) on 10/25/21 at  6:00 PM EST by a video-enabled telemedicine application and verified that I am speaking with the correct person using two identifiers.  Location: Patient: Virtual Visit Location Patient: Home Provider: Virtual Visit Location Provider: Home Office   I discussed the limitations of evaluation and  management by telemedicine and the availability of in person appointments. The patient expressed understanding and agreed to proceed.    History of Present Illness: Alex [name] is a 40 y.o. who identifies as a nonbinary who was assigned adult at birth, and is being seen today for COVID-19. Alex [name] notes symptoms starting yesterday with some congestion, scratchy throat and fatigue, worsening to fever, chest congestion. Because of symptoms Alex [name] took a home COVID test which was positive. Patient with history of exercise-induced asthma and has noted some chest tightness. Some nausea without vomiting and diarrhea. Tylenol and Advil for OTC relief.    HPI: HPI  Problems:  Patient Active Problem List   Diagnosis Date Noted   MDD (major depressive disorder), single episode, severe , no psychosis ([place])    MDD (major depressive disorder), recurrent severe, without psychosis ([place]) 12/13/2017   Morbid obesity due to excess calories ([place]) 03/04/2016   Essential hypertension 03/04/2016   Hypersomnia with sleep apnea 01/14/2016   Snoring 01/14/2016    Allergies: No Known Allergies Medications:  Current Outpatient Medications:    albuterol (VENTOLIN HFA) 108 (90 Base) MCG/ACT inhaler, Inhale 1-2 puffs into the lungs every 6 (six) hours as needed for wheezing or shortness of breath., Disp: 8.5 g, Rfl: 11   buPROPion (WELLBUTRIN XL) 300 MG 24 hr tablet, Take 1 tablet (300 mg total) by mouth daily.,  Disp: 30 tablet, Rfl: 2   busPIRone (BUSPAR) 15 MG tablet, Take 1 tablet (15 mg total) by mouth 3 (three) times daily for anxiety, Disp: 90 tablet, Rfl: 2   Fexofenadine HCl (ALLEGRA PO), Take by mouth., Disp: , Rfl:    hydrOXYzine (ATARAX/VISTARIL) 25 MG tablet, Take 1 tablet (25 mg total) by mouth 3 (three) times daily as needed for anxiety., Disp: 60 tablet, Rfl: 0   Multiple Vitamin (MULTIVITAMIN) tablet, Take 1 tablet by mouth daily., Disp: , Rfl:    Probiotic Product (PROBIOTIC DAILY PO), Take by  mouth., Disp: , Rfl:    Vitamin D, Ergocalciferol, (DRISDOL) 1.25 MG (50000 UNIT) CAPS capsule, Take 1 capsule (50,000 Units total) by mouth every 7 (seven) days., Disp: 4 capsule, Rfl: 0  Observations/Objective: Patient is well-developed, well-nourished in no acute distress.  Resting comfortably at home.  Head is normocephalic, atraumatic.  No labored breathing. Speech is clear and coherent with logical content.  Patient is alert and oriented at baseline.   Assessment and Plan: 1. COVID-19  Patient with multiple risk factors for complicated course of illness. Discussed risks/benefits of antiviral medications including most common potential ADRs. Patient voiced understanding and would like to proceed with antiviral medication. They are candidate for molnupiravir. Rx sent to pharmacy. Supportive measures, OTC medications and vitamin regimen reviewed. Patient has been enrolled in a MyChart COVID symptom monitoring program. Samule Dry reviewed in detail. Strict ER precautions discussed with patient.    Follow Up Instructions: I discussed the assessment and treatment plan with the patient. The patient was provided an opportunity to ask questions and all were answered. The patient agreed with the plan and demonstrated an understanding of the instructions.  A copy of instructions were sent to the patient via MyChart unless otherwise noted below.    The patient was advised to call back or seek an in-person evaluation if the symptoms worsen or if the condition fails to improve as anticipated.  Time:  I spent 15 minutes with the patient via telehealth technology discussing the above problems/concerns.    [name], PA-C

## 2021-10-28 ENCOUNTER — Telehealth: Payer: Self-pay

## 2021-10-28 NOTE — Telephone Encounter (Signed)
BPA triggered for worsening symptoms appetite. Patient called, left VM to return the call to speak to a nurse [phone]. Will send advice via MyChart.

## 2021-10-29 ENCOUNTER — Other Ambulatory Visit (HOSPITAL_COMMUNITY): Payer: Self-pay

## 2021-10-29 ENCOUNTER — Telehealth (INDEPENDENT_AMBULATORY_CARE_PROVIDER_SITE_OTHER): Payer: 59 | Admitting: Family Medicine

## 2021-10-29 DIAGNOSIS — U071 COVID-19: Secondary | ICD-10-CM | POA: Diagnosis not present

## 2021-10-29 DIAGNOSIS — E559 Vitamin D deficiency, unspecified: Secondary | ICD-10-CM

## 2021-10-29 DIAGNOSIS — Z6841 Body Mass Index (BMI) 40.0 and over, adult: Secondary | ICD-10-CM

## 2021-10-29 MED ORDER — VITAMIN D (ERGOCALCIFEROL) 1.25 MG (50000 UNIT) PO CAPS
50000.0000 [IU] | ORAL_CAPSULE | ORAL | 0 refills | Status: DC
  Filled 2021-10-29: qty 4, 28d supply, fill #0

## 2021-10-30 NOTE — Progress Notes (Signed)
TeleHealth Visit:  Due to the COVID-19 pandemic, this visit was completed with telemedicine (audio/video) technology to reduce patient and provider exposure as well as to preserve personal protective equipment.   Alex [name] has verbally consented to this TeleHealth visit. The patient is located at home, the provider is located at the [facility] office. The participants in this visit include the listed provider and patient. The visit was conducted today via MyChart video.   Chief Complaint: OBESITY Alex [name] is here to discuss Alex [name] progress with Alex [name] obesity treatment plan along with follow-up of Alex [name] obesity related diagnoses. Alex [name] is on the Category 3 Plan and states Alex [name] is following Alex [name] eating plan approximately 30% of the time. Alex [name] states Alex [name] was doing 4 workouts for 45 minutes-1 hour.  Today's visit was #: 9 Starting weight: 311 lbs Starting date: 06/25/2021  Interim History: Alex [name] has done well with minimizing holiday weight gain. He now is recovering from Alex [name] and his appetite was decreased, but it is starting to return. He has had to stop exercising while sick.  Subjective:   1. Vitamin D deficiency Alex [name] is stable on Vit D with no side effects noted. His last Vit D level was not yet at goal.  2. COVID-19 Alex [name] started symptoms of COVID approximately 4 days ago. He started molnupiravir and he is feeling better. He still has significant fatigue and a cough, but his appetite is starting to return.  Assessment/Plan:   1. Vitamin D deficiency We will refill prescription Vitamin D for 1 month. Alex [name] will follow-up for routine testing of Vitamin D, at least 2-3 times per year to avoid over-replacement.  - Vitamin D, Ergocalciferol, (DRISDOL) 1.25 MG (50000 UNIT) CAPS capsule; Take 1 capsule (50,000 Units total) by mouth every 7 (seven) days.  Dispense: 4 capsule; Refill: 0  2.  COVID-19 Alex [name] is to finish his antivirals, and work on hydration and rest. He will advance his diet as tolerated and slowly wotk back up to exercise as he feels better.  3. Obesity with current BMI of 42.4 Alex [name] is currently in the action stage of change. As such, Alex [name] goal is to continue with weight loss efforts. Alex [name] has agreed to the Category 3 Plan.   Alex [name] is to slowly return to his meal plan and exercise as tolerated.   Behavioral modification strategies: increasing water intake.  Alex [name] has agreed to follow-up with our clinic in 3 to 4 weeks. Alex [name] was informed of the importance of frequent follow-up visits to maximize Alex [name] success with intensive lifestyle modifications for Alex [name] Alex [name] multiple health conditions.  Objective:   VITALS: Per patient if applicable, see vitals. GENERAL: Alert and in no acute distress. CARDIOPULMONARY: No increased WOB. Speaking in clear sentences.  PSYCH: Pleasant and cooperative. Speech normal rate and rhythm. Affect is appropriate. Insight and judgement are appropriate. Attention is focused, linear, and appropriate.  NEURO: Oriented as arrived to appointment on time with no prompting.   Lab Results  Component Value Date   CREATININE 0.87 06/25/2021   BUN 13 06/25/2021   NA 140 06/25/2021   K 4.7 06/25/2021   CL 103 06/25/2021   CO2 18 (L) 06/25/2021   Lab Results  Component Value Date   ALT 36 06/25/2021   AST 28 06/25/2021   ALKPHOS 82 06/25/2021   BILITOT 0.5 06/25/2021   Lab Results  Component  Value Date   HGBA1C 5.0 06/25/2021   Lab Results  Component Value Date   INSULIN 6.5 06/25/2021   Lab Results  Component Value Date   TSH 1.820 06/25/2021   Lab Results  Component Value Date   CHOL 163 06/13/2021   HDL 48 06/13/2021   LDLCALC 102 (H) 06/13/2021   TRIG 69 06/13/2021   CHOLHDL 3.2 03/07/2019   Lab Results  Component Value Date   VD25OH 33.0 06/25/2021    Lab Results  Component Value Date   WBC 5.8 06/25/2021   HGB 14.3 06/25/2021   HCT 43.5 06/25/2021   MCV 85 06/25/2021   PLT 218 06/05/2021   No results found for: IRON, TIBC, FERRITIN  Attestation Statements:   Reviewed by clinician on day of visit: allergies, medications, problem list, medical history, surgical history, family history, social history, and previous encounter notes.   I, Trixie Dredge, am acting as transcriptionist for [name], MD.  I have reviewed the above documentation for accuracy and completeness, and I agree with the above. - [name], MD

## 2021-10-31 ENCOUNTER — Encounter: Payer: Self-pay | Admitting: Family Medicine

## 2021-11-12 ENCOUNTER — Ambulatory Visit: Payer: 59 | Admitting: Family Medicine

## 2021-11-14 NOTE — Progress Notes (Signed)
°  Office: [phone]  /  Fax: [phone]    Date: November 25, 2021   Appointment Start Time: 8:32am Duration: 29 minutes Provider: Glennie Isle, Psy.D. Type of Session: Individual Therapy  Location of Patient: Home (private location)  Location of Provider: Provider's Home (private office) Type of Contact: Telepsychological Visit via MyChart Video Visit  Session Content: Alex [name] is a 41 y.o. adult presenting for a follow-up appointment to address the previously established treatment goal of increasing coping skills.Today's appointment was a telepsychological visit due to COVID-19. Alex [name] provided verbal consent for today's telepsychological appointment and Alex [name] is aware Alex [name] is responsible for securing confidentiality on Alex [name] end of the session. Prior to proceeding with today's appointment, Alex [name]'s physical location at the time of this appointment was obtained as well a phone number Alex [name] could be reached at in the event of technical difficulties. Alex [name] and this provider participated in today's telepsychological service. This provider called Alex [name] at 8:34am due to technical issues.   This provider conducted a brief check-in. Alex [name] reported, "I had a terrible January." He shared he had COVID-19 and secondary concerns (e.g., leg swelling, anxiety). This was explored further and processed. He noted he reached out to his primary therapist for support. Regarding eating habits, Alex [name] disclosed he did his "own thing to get through the month." He noted he had a "turning point" this past weekend, which has helped him "get back on plan as of Monday." Positive reinforcement was provided. Session focused further on mindfulness/self-compassion to assist with coping. Alex [name] was led through a mindfulness exercise ( Body Loving Kindness ) and Alex [name] experience was processed. Alex [name] provided verbal consent during today's appointment for this  provider to send a handout with today's exercise via e-mail. Alex [name] was receptive to today's appointment as evidenced by openness to sharing, responsiveness to feedback, and willingness to continue engaging in learned skills.  Mental Status Examination:  Appearance: well groomed and appropriate hygiene  Behavior: appropriate to circumstances Mood: neutral Affect: mood congruent Speech: WNL Eye Contact: appropriate Psychomotor Activity: WNL Gait: unable to assess Thought Process: linear, logical, and goal directed and no evidence or endorsement of suicidal, homicidal, and self-harm ideation, plan and intent  Thought Content/Perception: no hallucinations, delusions, bizarre thinking or behavior endorsed or observed Orientation: AAOx4 Memory/Concentration: memory, attention, language, and fund of knowledge intact  Insight: good Judgment: good  Interventions:  Conducted a brief chart review Provided empathic reflections and validation Reviewed content from the previous session Employed supportive psychotherapy interventions to facilitate reduced distress and to improve coping skills with identified stressors Engaged patient in mindfulness exercise(s)  DSM-5 Diagnosis(es):  F32.89 Other Specified Depressive Disorder, Emotional Eating Behaviors and F41.9 Unspecified Anxiety Disorder  Treatment Goal & Progress: During the initial appointment with this provider, the following treatment goal was established: increase coping skills. Alex [name] has demonstrated progress in Alex [name] goal as evidenced by increased awareness of hunger patterns, increased awareness of triggers for emotional eating behaviors, and reduction in emotional eating behaviors . Alex [name] also continues to demonstrate willingness to engage in learned skill(s).  Plan: The next appointment will be scheduled in 3-4 weeks, which will be via MyChart Video Visit. The next session will focus on working towards the established  treatment goal and termination. Alex [name] will continue meeting with his primary therapist. He discussed considering a new therapist for in-person services. He provided verbal consent for this provider to e-mail referral options.

## 2021-11-17 ENCOUNTER — Other Ambulatory Visit (HOSPITAL_COMMUNITY): Payer: Self-pay

## 2021-11-19 ENCOUNTER — Ambulatory Visit: Payer: 59 | Admitting: Family Medicine

## 2021-11-19 ENCOUNTER — Encounter: Payer: Self-pay | Admitting: Family Medicine

## 2021-11-19 ENCOUNTER — Other Ambulatory Visit: Payer: Self-pay

## 2021-11-19 VITALS — BP 130/70 | HR 104 | Ht 70.0 in | Wt 307.0 lb

## 2021-11-19 DIAGNOSIS — F322 Major depressive disorder, single episode, severe without psychotic features: Secondary | ICD-10-CM | POA: Diagnosis not present

## 2021-11-19 DIAGNOSIS — U099 Post covid-19 condition, unspecified: Secondary | ICD-10-CM | POA: Diagnosis not present

## 2021-11-19 DIAGNOSIS — R0609 Other forms of dyspnea: Secondary | ICD-10-CM

## 2021-11-19 DIAGNOSIS — R600 Localized edema: Secondary | ICD-10-CM | POA: Diagnosis not present

## 2021-11-19 NOTE — Progress Notes (Signed)
Date:  11/19/2021   Name:  Alex [name]   DOB:  1981-06-01   MRN:  [id number]   Chief Complaint: Cough (Dry, chronic cough- thick mucous) and Leg Swelling (L) leg has always been bigger than the right, but not pitting edema)  Cough   Lab Results  Component Value Date   NA 140 06/25/2021   K 4.7 06/25/2021   CO2 18 (L) 06/25/2021   GLUCOSE 82 06/25/2021   BUN 13 06/25/2021   CREATININE 0.87 06/25/2021   CALCIUM 9.2 06/25/2021   EGFR 114 06/25/2021   GFRNONAA >60 06/05/2021   Lab Results  Component Value Date   CHOL 163 06/13/2021   HDL 48 06/13/2021   LDLCALC 102 (H) 06/13/2021   TRIG 69 06/13/2021   CHOLHDL 3.2 03/07/2019   Lab Results  Component Value Date   TSH 1.820 06/25/2021   Lab Results  Component Value Date   HGBA1C 5.0 06/25/2021   Lab Results  Component Value Date   WBC 5.8 06/25/2021   HGB 14.3 06/25/2021   HCT 43.5 06/25/2021   MCV 85 06/25/2021   PLT 218 06/05/2021   Lab Results  Component Value Date   ALT 36 06/25/2021   AST 28 06/25/2021   ALKPHOS 82 06/25/2021   BILITOT 0.5 06/25/2021   Lab Results  Component Value Date   VD25OH 33.0 06/25/2021     Review of Systems  Respiratory:  Positive for cough.    Patient Active Problem List   Diagnosis Date Noted   MDD (major depressive disorder), single episode, severe , no psychosis ([place])    MDD (major depressive disorder), recurrent severe, without psychosis ([place]) 12/13/2017   Morbid obesity due to excess calories ([place]) 03/04/2016   Essential hypertension 03/04/2016   Hypersomnia with sleep apnea 01/14/2016   Snoring 01/14/2016    No Known Allergies  Past Surgical History:  Procedure Laterality Date   WISDOM TOOTH EXTRACTION      Social History   Tobacco Use   Smoking status: Never   Smokeless tobacco: Never  Vaping Use   Vaping Use: Never used  Substance Use Topics   Alcohol use: Yes    Alcohol/week: 2.0 standard drinks    Types: 2 Standard drinks or equivalent  per week    Comment: occasional   Drug use: No     Medication list has been reviewed and updated.  Current Meds  Medication Sig   albuterol (VENTOLIN HFA) 108 (90 Base) MCG/ACT inhaler Inhale 1-2 puffs into the lungs every 6 (six) hours as needed for wheezing or shortness of breath.   buPROPion (WELLBUTRIN XL) 300 MG 24 hr tablet Take 1 tablet (300 mg total) by mouth daily.   busPIRone (BUSPAR) 15 MG tablet Take 1 tablet (15 mg total) by mouth 3 (three) times daily for anxiety   Fexofenadine HCl (ALLEGRA PO) Take by mouth.   hydrOXYzine (ATARAX/VISTARIL) 25 MG tablet Take 1 tablet (25 mg total) by mouth 3 (three) times daily as needed for anxiety.   Multiple Vitamin (MULTIVITAMIN) tablet Take 1 tablet by mouth daily.   Probiotic Product (PROBIOTIC DAILY PO) Take by mouth.   Vitamin D, Ergocalciferol, (DRISDOL) 1.25 MG (50000 UNIT) CAPS capsule Take 1 capsule (50,000 Units total) by mouth every 7 (seven) days.    PHQ 2/9 Scores 11/19/2021 06/25/2021 06/18/2021 06/04/2021  PHQ - 2 Score 0 5 0 0  PHQ- 9 Score 6 22 0 0    GAD 7 : Generalized Anxiety  Score 11/19/2021 06/18/2021 06/04/2021 07/16/2020  Nervous, Anxious, on Edge 3 1 0 0  Control/stop worrying 3 0 1 0  Worry too much - different things 1 0 1 0  Trouble relaxing 2 0 1 0  Restless 1 0 0 0  Easily annoyed or irritable 3 0 1 0  Afraid - awful might happen 3 0 0 0  Total GAD 7 Score 16 1 4  0  Anxiety Difficulty Somewhat difficult Not difficult at all Not difficult at all -    BP Readings from Last 3 Encounters:  11/19/21 130/70  10/08/21 113/71  09/24/21 130/73    Physical Exam Vitals and nursing note reviewed.  HENT:     Head: Normocephalic.  Eyes:     General: No scleral icterus.       Right eye: No discharge.        Left eye: No discharge.     Conjunctiva/sclera: Conjunctivae normal.     Pupils: Pupils are equal, round, and reactive to light.  Neck:     Thyroid: No thyromegaly.     Vascular: No JVD.     Trachea:  No tracheal deviation.  Cardiovascular:     Rate and Rhythm: Normal rate and regular rhythm.     Pulses: Normal pulses.     Heart sounds: Normal heart sounds, S1 normal and S2 normal. No murmur heard. No systolic murmur is present.  No diastolic murmur is present.    No friction rub. No gallop. No S3 or S4 sounds.  Pulmonary:     Effort: No respiratory distress.     Breath sounds: Normal breath sounds. No wheezing, rhonchi or rales.  Abdominal:     General: Bowel sounds are normal.     Palpations: Abdomen is soft. There is no mass.     Tenderness: There is no abdominal tenderness. There is no guarding or rebound.  Musculoskeletal:        General: No tenderness. Normal range of motion.     Cervical back: Normal range of motion and neck supple.     Right lower leg: Normal. No swelling, deformity or tenderness. No edema.     Left lower leg: Swelling present. No deformity or tenderness. 1+ Edema present.     Comments: Enlarged calf circumference left  Lymphadenopathy:     Cervical: No cervical adenopathy.  Skin:    General: Skin is warm.     Findings: No rash.  Neurological:     Mental Status: Alex [name] is alert.     Cranial Nerves: No cranial nerve deficit.    Wt Readings from Last 3 Encounters:  11/19/21 (!) 307 lb (139.3 kg)  10/08/21 295 lb (133.8 kg)  09/24/21 300 lb (136.1 kg)    BP 130/70    Pulse (!) 104    Ht 5' 10"  (1.778 m)    Wt (!) 307 lb (139.3 kg)    BMI 44.05 kg/m   Assessment and Plan:  1. Edema of left lower leg Chronic.  Persistent.  Patient continues to have increased size of his left lower leg presumably due to venous insufficiency although this is never been formally evaluated.  We will refer to vascular surgery for evaluation of venous system and further evaluation if necessary. - Ambulatory referral to Vascular Surgery  2. MDD (major depressive disorder), single episode, severe , no psychosis (HCC) Chronic.  Uncontrolled.  Relatively stable.   Patient does have a psychiatrist and is being followed.  PHQ is 6  and gad score is 16.  Patient has been instructed to return for reevaluation and possible change in current dosing of medication.  3. Post-COVID chronic dyspnea Chronic.  Persistent.  Stable.  Status post 3 weeks patient had COVID and is continuing to have cough and shortness of breath.  Been told this may take months to resolve but patient would like to have reevaluation by pulmonary at this time. - Ambulatory referral to Pulmonology

## 2021-11-25 ENCOUNTER — Telehealth (INDEPENDENT_AMBULATORY_CARE_PROVIDER_SITE_OTHER): Payer: 59 | Admitting: Psychology

## 2021-11-25 ENCOUNTER — Other Ambulatory Visit (HOSPITAL_COMMUNITY): Payer: Self-pay

## 2021-11-25 DIAGNOSIS — F3289 Other specified depressive episodes: Secondary | ICD-10-CM

## 2021-11-25 DIAGNOSIS — F332 Major depressive disorder, recurrent severe without psychotic features: Secondary | ICD-10-CM | POA: Diagnosis not present

## 2021-11-25 DIAGNOSIS — F4322 Adjustment disorder with anxiety: Secondary | ICD-10-CM | POA: Diagnosis not present

## 2021-11-25 DIAGNOSIS — F419 Anxiety disorder, unspecified: Secondary | ICD-10-CM | POA: Diagnosis not present

## 2021-11-25 DIAGNOSIS — F411 Generalized anxiety disorder: Secondary | ICD-10-CM | POA: Diagnosis not present

## 2021-11-25 DIAGNOSIS — F330 Major depressive disorder, recurrent, mild: Secondary | ICD-10-CM | POA: Diagnosis not present

## 2021-11-25 MED ORDER — HYDROXYZINE HCL 25 MG PO TABS
ORAL_TABLET | ORAL | 2 refills | Status: DC
  Filled 2021-11-25: qty 30, 10d supply, fill #0

## 2021-11-25 MED ORDER — BUPROPION HCL ER (XL) 300 MG PO TB24
300.0000 mg | ORAL_TABLET | Freq: Every day | ORAL | 2 refills | Status: DC
  Filled 2021-11-25: qty 30, 30d supply, fill #0

## 2021-11-25 MED ORDER — BUSPIRONE HCL 15 MG PO TABS
15.0000 mg | ORAL_TABLET | Freq: Three times a day (TID) | ORAL | 2 refills | Status: DC
  Filled 2021-11-25: qty 90, 30d supply, fill #0

## 2021-11-26 ENCOUNTER — Other Ambulatory Visit (HOSPITAL_COMMUNITY): Payer: Self-pay

## 2021-11-26 ENCOUNTER — Encounter (INDEPENDENT_AMBULATORY_CARE_PROVIDER_SITE_OTHER): Payer: Self-pay | Admitting: Family Medicine

## 2021-11-26 ENCOUNTER — Other Ambulatory Visit: Payer: Self-pay

## 2021-11-26 ENCOUNTER — Ambulatory Visit (INDEPENDENT_AMBULATORY_CARE_PROVIDER_SITE_OTHER): Payer: 59 | Admitting: Family Medicine

## 2021-11-26 VITALS — BP 106/68 | HR 72 | Temp 97.6°F | Ht 70.0 in | Wt 296.0 lb

## 2021-11-26 DIAGNOSIS — E559 Vitamin D deficiency, unspecified: Secondary | ICD-10-CM | POA: Diagnosis not present

## 2021-11-26 DIAGNOSIS — R5383 Other fatigue: Secondary | ICD-10-CM

## 2021-11-26 DIAGNOSIS — Z6841 Body Mass Index (BMI) 40.0 and over, adult: Secondary | ICD-10-CM

## 2021-11-26 DIAGNOSIS — E669 Obesity, unspecified: Secondary | ICD-10-CM

## 2021-11-26 DIAGNOSIS — Z9189 Other specified personal risk factors, not elsewhere classified: Secondary | ICD-10-CM | POA: Diagnosis not present

## 2021-11-26 MED ORDER — VITAMIN D (ERGOCALCIFEROL) 1.25 MG (50000 UNIT) PO CAPS
50000.0000 [IU] | ORAL_CAPSULE | ORAL | 0 refills | Status: DC
  Filled 2021-11-26: qty 4, 28d supply, fill #0

## 2021-11-26 NOTE — Progress Notes (Signed)
Chief Complaint:   OBESITY Josha is here to discuss FAOLAN SPRINGFIELD progress with [name] obesity treatment plan along with follow-up of SARATH PRIVOTT obesity related diagnoses. Maxamus is on the Category 3 Plan and states BYRANT VALENT is following VERN GUERETTE eating plan approximately 30-40% of the time. Ryszard states ERMAN THUM is doing 0 minutes 0 times per week.  Today's visit was #: 10 Starting weight: 311 lbs Starting date: 06/25/2021 Today's weight: 296 lbs Today's date: 11/26/2021 Total lbs lost to date: 15 Total lbs lost since last in-office visit: 0  Interim History: Anthonie has done well with minimizing holiday weight gain. He is recovering from [place] and his energy still low. He is open to looking at other eating plan options.  Subjective:   1. Vitamin D deficiency Nichalas is on Vit D, and his last lab was not yet at goal. He notes fatigue.  2. Other fatigue Antiono is status post COVID, and he notes continuing fatigue and some paresthesias and has a numb tingling in both legs.  3. At risk for malnutrition [place] is at increased risk for malnutrition due to COVID and paresthesias.   Assessment/Plan:   1. Vitamin D deficiency Low Vitamin D level contributes to fatigue and are associated with obesity, breast, and colon cancer. We will check labs today, and we will refill take prescription Vitamin D for 1 month. Shaft will follow-up for routine testing of Vitamin D, at least 2-3 times per year to avoid over-replacement.  - Vitamin D, Ergocalciferol, (DRISDOL) 1.25 MG (50000 UNIT) CAPS capsule; Take 1 capsule (50,000 Units total) by mouth every 7 (seven) days.  Dispense: 4 capsule; Refill: 0 - VITAMIN D 25 Hydroxy (Vit-D Deficiency, Fractures)  2. Other fatigue We will check labs today. We will follow up at [place] next office visit.  - Vitamin B12 - CMP14+EGFR - Lipid Panel With LDL/HDL Ratio - Insulin, random - Hemoglobin  A1c - Folate - Iron and TIBC - Ferritin - CBC with Differential/Platelet - TSH  3. At risk for malnutrition Chasen was given approximately 15 minutes of counseling today regarding prevention of malnutrition and ways to meet macronutrient goals.    4. Obesity with current BMI of 42.6 Nidal is currently in the action stage of change. As such, JAIREN GOLDFARB goal is to continue with weight loss efforts. AUDEL COAKLEY has agreed to change to following a lower carbohydrate, vegetable and lean protein rich diet plan.   Behavioral modification strategies: increasing lean protein intake, decreasing simple carbohydrates, and meal planning and cooking strategies.  Celestino has agreed to follow-up with our clinic in 2 to 3 weeks. ROSSIE BRETADO was informed of the importance of frequent follow-up visits to maximize PRADEEP BEAUBRUN success with intensive lifestyle modifications for DONTEL HARSHBERGER multiple health conditions.   Eliezer was informed we would discuss EMMAUEL HALLUMS lab results at [name] next visit unless there is a critical issue that needs to be addressed sooner. Savian agreed to keep Christian Mate Amey's next visit at the agreed upon time to discuss these results.  Objective:   Blood pressure 106/68, pulse 72, temperature 97.6 F (36.4 C), height 5' 10"  (1.778 m), weight 296 lb (134.3 kg), SpO2 100 %. Body mass index is 42.47 kg/m.  General: Cooperative, alert, well developed, in no acute distress. HEENT: Conjunctivae and lids unremarkable. Cardiovascular: Regular rhythm.  Lungs: Normal work of breathing. Neurologic: No focal deficits.  Lab Results  Component Value Date   CREATININE 0.87 06/25/2021   BUN 13 06/25/2021   NA 140 06/25/2021   K 4.7 06/25/2021   CL 103 06/25/2021   CO2 18 (L) 06/25/2021   Lab Results  Component Value Date   ALT 36 06/25/2021   AST 28 06/25/2021   ALKPHOS 82 06/25/2021   BILITOT 0.5 06/25/2021   Lab Results   Component Value Date   HGBA1C 5.0 06/25/2021   Lab Results  Component Value Date   INSULIN 6.5 06/25/2021   Lab Results  Component Value Date   TSH 1.820 06/25/2021   Lab Results  Component Value Date   CHOL 163 06/13/2021   HDL 48 06/13/2021   LDLCALC 102 (H) 06/13/2021   TRIG 69 06/13/2021   CHOLHDL 3.2 03/07/2019   Lab Results  Component Value Date   VD25OH 33.0 06/25/2021   Lab Results  Component Value Date   WBC 5.8 06/25/2021   HGB 14.3 06/25/2021   HCT 43.5 06/25/2021   MCV 85 06/25/2021   PLT 218 06/05/2021   No results found for: IRON, TIBC, FERRITIN  Attestation Statements:   Reviewed by clinician on day of visit: allergies, medications, problem list, medical history, surgical history, family history, social history, and previous encounter notes.   I, Trixie Dredge, am acting as transcriptionist for [name], MD.  I have reviewed the above documentation for accuracy and completeness, and I agree with the above. -  [name], MD

## 2021-11-27 ENCOUNTER — Encounter (INDEPENDENT_AMBULATORY_CARE_PROVIDER_SITE_OTHER): Payer: Self-pay | Admitting: Family Medicine

## 2021-11-27 LAB — CBC WITH DIFFERENTIAL/PLATELET
Basophils Absolute: 0 10*3/uL (ref 0.0–0.2)
Basos: 0 %
EOS (ABSOLUTE): 0.1 10*3/uL (ref 0.0–0.4)
Eos: 2 %
Hematocrit: 46 % (ref 37.5–51.0)
Hemoglobin: 15.4 g/dL (ref 13.0–17.7)
Immature Grans (Abs): 0 10*3/uL (ref 0.0–0.1)
Immature Granulocytes: 0 %
Lymphocytes Absolute: 1.6 10*3/uL (ref 0.7–3.1)
Lymphs: 28 %
MCH: 29.1 pg (ref 26.6–33.0)
MCHC: 33.5 g/dL (ref 31.5–35.7)
MCV: 87 fL (ref 79–97)
Monocytes Absolute: 0.5 10*3/uL (ref 0.1–0.9)
Monocytes: 8 %
Neutrophils Absolute: 3.6 10*3/uL (ref 1.4–7.0)
Neutrophils: 62 %
Platelets: 180 10*3/uL (ref 150–450)
RBC: 5.29 x10E6/uL (ref 4.14–5.80)
RDW: 13.7 % (ref 11.6–15.4)
WBC: 5.8 10*3/uL (ref 3.4–10.8)

## 2021-11-27 LAB — CMP14+EGFR
ALT: 34 IU/L (ref 0–44)
AST: 25 IU/L (ref 0–40)
Albumin/Globulin Ratio: 2 (ref 1.2–2.2)
Albumin: 4.4 g/dL (ref 4.0–5.0)
Alkaline Phosphatase: 85 IU/L (ref 44–121)
BUN/Creatinine Ratio: 15 (ref 9–20)
BUN: 15 mg/dL (ref 6–20)
Bilirubin Total: 0.4 mg/dL (ref 0.0–1.2)
CO2: 24 mmol/L (ref 20–29)
Calcium: 9.1 mg/dL (ref 8.7–10.2)
Chloride: 103 mmol/L (ref 96–106)
Creatinine, Ser: 0.99 mg/dL (ref 0.76–1.27)
Globulin, Total: 2.2 g/dL (ref 1.5–4.5)
Glucose: 90 mg/dL (ref 70–99)
Potassium: 4.5 mmol/L (ref 3.5–5.2)
Sodium: 141 mmol/L (ref 134–144)
Total Protein: 6.6 g/dL (ref 6.0–8.5)
eGFR: 100 mL/min/{1.73_m2} (ref >59)

## 2021-11-27 LAB — FOLATE: Folate: >20 ng/mL (ref >3.0)

## 2021-11-27 LAB — HEMOGLOBIN A1C
Est. average glucose Bld gHb Est-mCnc: 97 mg/dL
Hgb A1c MFr Bld: 5 % (ref 4.8–5.6)

## 2021-11-27 LAB — IRON AND TIBC
Iron Saturation: 16 % (ref 15–55)
Iron: 61 ug/dL (ref 38–169)
Total Iron Binding Capacity: 373 ug/dL (ref 250–450)
UIBC: 312 ug/dL (ref 111–343)

## 2021-11-27 LAB — TSH: TSH: 2.91 u[IU]/mL (ref 0.450–4.500)

## 2021-11-27 LAB — LIPID PANEL WITH LDL/HDL RATIO
Cholesterol, Total: 176 mg/dL (ref 100–199)
HDL: 55 mg/dL (ref >39)
LDL Chol Calc (NIH): 110 mg/dL — ABNORMAL HIGH (ref 0–99)
LDL/HDL Ratio: 2 ratio (ref 0.0–3.6)
Triglycerides: 57 mg/dL (ref 0–149)
VLDL Cholesterol Cal: 11 mg/dL (ref 5–40)

## 2021-11-27 LAB — INSULIN, RANDOM: INSULIN: 10.1 u[IU]/mL (ref 2.6–24.9)

## 2021-11-27 LAB — VITAMIN D 25 HYDROXY (VIT D DEFICIENCY, FRACTURES): Vit D, 25-Hydroxy: 49.8 ng/mL (ref 30.0–100.0)

## 2021-11-27 LAB — VITAMIN B12: Vitamin B-12: 656 pg/mL (ref 232–1245)

## 2021-11-27 LAB — FERRITIN: Ferritin: 29 ng/mL — ABNORMAL LOW (ref 30–400)

## 2021-11-27 NOTE — Telephone Encounter (Signed)
Please see message and advise.  Thank you. ° °

## 2021-12-02 ENCOUNTER — Encounter: Payer: Self-pay | Admitting: Family Medicine

## 2021-12-09 NOTE — Progress Notes (Signed)
°  Office: [phone]  /  Fax: [phone]    Date: December 23, 2021   Appointment Start Time: 2:31pm Duration: 29 minutes Provider: Glennie Isle, Psy.D. Type of Session: Individual Therapy  Location of Patient: Home (private location) Location of Provider: Provider's Home (private office) Type of Contact: Telepsychological Visit via MyChart Video Visit  Session Content: Jaskaran is a 41 y.o. adult presenting for a follow-up appointment to address the previously established treatment goal of increasing coping skills.Today's appointment was a telepsychological visit due to COVID-19. [name] provided verbal consent for today's telepsychological appointment and MELROY BOUGHER is aware ERLIN GARDELLA is responsible for securing confidentiality on USBALDO PANNONE end of the session. Prior to proceeding with today's appointment, Kenyatte's physical location at the time of this appointment was obtained as well a phone number LEDFORD GOODSON could be reached at in the event of technical difficulties. [name] and this provider participated in today's telepsychological service.   This provider conducted a brief check-in. Ephraim shared about recent events, including switching roles at work. He discussed an improvement in eating habits. Reflected on progress to date (e.g., weight loss, reduction in emotional eating behaviors, engaging in self-care). A plan was developed to help Shemar cope with emotional eating behaviors in the future using learned skills. He was encouraged to write down his plan for future reference; he agreed. Overall, Clemmie was receptive to today's appointment as evidenced by openness to sharing, responsiveness to feedback, and willingness to continue engaging in learned skills.  Mental Status Examination:  Appearance: neat Behavior: appropriate to circumstances Mood: neutral Affect: mood congruent Speech: WNL Eye Contact: appropriate Psychomotor Activity: WNL Gait: unable  to assess Thought Process: linear, logical, and goal directed and no evidence or endorsement of suicidal, homicidal, and self-harm ideation, plan and intent  Thought Content/Perception: no hallucinations, delusions, bizarre thinking or behavior endorsed or observed Orientation: AAOx4 Memory/Concentration: intact Insight: good Judgment: good  Interventions:  Conducted a brief chart review Provided empathic reflections and validation Employed supportive psychotherapy interventions to facilitate reduced distress and to improve coping skills with identified stressors Reviewed learned skills  DSM-5 Diagnosis(es):  F32.89 Other Specified Depressive Disorder, Emotional Eating Behaviors and F41.9 Unspecified Anxiety Disorder  Treatment Goal & Progress: During the initial appointment with this provider, the following treatment goal was established: increase coping skills. Jonathon demonstrated progress in KEMAURI MUSA goal as evidenced by increased awareness of hunger patterns, increased awareness of triggers for emotional eating behaviors, and reduction in emotional eating behaviors . Alphonzo also continues to demonstrate willingness to engage in learned skill(s).  Plan: As previously planned, today was Eshan's last appointment with this provider. RUTH TULLY acknowledged understanding that [name] may request a follow-up appointment with this provider in the future as long as YERAY TOMAS is still established with the clinic. Ari will be initiating therapeutic and psychiatric services with [facility] in two weeks. No further follow-up planned by this provider.

## 2021-12-10 ENCOUNTER — Other Ambulatory Visit: Payer: Self-pay

## 2021-12-10 ENCOUNTER — Ambulatory Visit: Payer: 59 | Admitting: Family Medicine

## 2021-12-10 ENCOUNTER — Encounter: Payer: Self-pay | Admitting: Family Medicine

## 2021-12-10 ENCOUNTER — Encounter (INDEPENDENT_AMBULATORY_CARE_PROVIDER_SITE_OTHER): Payer: Self-pay | Admitting: Physician Assistant

## 2021-12-10 ENCOUNTER — Other Ambulatory Visit (HOSPITAL_COMMUNITY): Payer: Self-pay

## 2021-12-10 ENCOUNTER — Ambulatory Visit (INDEPENDENT_AMBULATORY_CARE_PROVIDER_SITE_OTHER): Payer: 59 | Admitting: Physician Assistant

## 2021-12-10 VITALS — BP 146/84 | HR 87 | Ht 70.0 in | Wt 300.0 lb

## 2021-12-10 VITALS — BP 120/58 | HR 81 | Temp 97.8°F | Ht 70.0 in | Wt 295.0 lb

## 2021-12-10 DIAGNOSIS — Z9189 Other specified personal risk factors, not elsewhere classified: Secondary | ICD-10-CM | POA: Diagnosis not present

## 2021-12-10 DIAGNOSIS — F419 Anxiety disorder, unspecified: Secondary | ICD-10-CM | POA: Diagnosis not present

## 2021-12-10 DIAGNOSIS — Z6841 Body Mass Index (BMI) 40.0 and over, adult: Secondary | ICD-10-CM

## 2021-12-10 DIAGNOSIS — E669 Obesity, unspecified: Secondary | ICD-10-CM | POA: Diagnosis not present

## 2021-12-10 DIAGNOSIS — E559 Vitamin D deficiency, unspecified: Secondary | ICD-10-CM

## 2021-12-10 DIAGNOSIS — F32A Depression, unspecified: Secondary | ICD-10-CM

## 2021-12-10 DIAGNOSIS — D171 Benign lipomatous neoplasm of skin and subcutaneous tissue of trunk: Secondary | ICD-10-CM

## 2021-12-10 DIAGNOSIS — M5417 Radiculopathy, lumbosacral region: Secondary | ICD-10-CM | POA: Diagnosis not present

## 2021-12-10 MED ORDER — MELOXICAM 15 MG PO TABS
15.0000 mg | ORAL_TABLET | Freq: Every day | ORAL | 2 refills | Status: DC
  Filled 2021-12-10: qty 30, 30d supply, fill #0
  Filled 2022-01-07: qty 30, 30d supply, fill #1

## 2021-12-10 MED ORDER — VITAMIN D (ERGOCALCIFEROL) 1.25 MG (50000 UNIT) PO CAPS
50000.0000 [IU] | ORAL_CAPSULE | ORAL | 0 refills | Status: DC
  Filled 2021-12-10 – 2022-01-07 (×2): qty 4, 28d supply, fill #0

## 2021-12-10 NOTE — Progress Notes (Signed)
Date:  12/10/2021   Name:  Alex [name]   DOB:  [birth year]   MRN:  [id number]   Chief Complaint: Mass (Can feel a lump on L) side since losing inches. No pain unless "messing with it") and Back Pain (Radiating down both legs)  Patient is a 41 year old  male presents for a evaluation of palpable subcutaneous nodule. . The patient reports the following problems: also backpain. Health maintenance has been reviewed up to date.    Back Pain This is a new problem. The current episode started in the past 7 days. The problem occurs daily. The problem has been waxing and waning since onset. The pain is present in the lumbar spine. The pain radiates to the right thigh and left thigh. The pain is at a severity of 3/10. The pain is mild. The symptoms are aggravated by lying down and sitting. Associated symptoms include paresthesias and tingling. Pertinent negatives include no abdominal pain, bladder incontinence, bowel incontinence, chest pain, dysuria, fever, headaches, paresis or pelvic pain.   Lab Results  Component Value Date   NA 141 11/26/2021   K 4.5 11/26/2021   CO2 24 11/26/2021   GLUCOSE 90 11/26/2021   BUN 15 11/26/2021   CREATININE 0.99 11/26/2021   CALCIUM 9.1 11/26/2021   EGFR 100 11/26/2021   GFRNONAA >60 06/05/2021   Lab Results  Component Value Date   CHOL 176 11/26/2021   HDL 55 11/26/2021   LDLCALC 110 (H) 11/26/2021   TRIG 57 11/26/2021   CHOLHDL 3.2 03/07/2019   Lab Results  Component Value Date   TSH 2.910 11/26/2021   Lab Results  Component Value Date   HGBA1C 5.0 11/26/2021   Lab Results  Component Value Date   WBC 5.8 11/26/2021   HGB 15.4 11/26/2021   HCT 46.0 11/26/2021   MCV 87 11/26/2021   PLT 180 11/26/2021   Lab Results  Component Value Date   ALT 34 11/26/2021   AST 25 11/26/2021   ALKPHOS 85 11/26/2021   BILITOT 0.4 11/26/2021   Lab Results  Component Value Date   VD25OH 49.8 11/26/2021     Review of Systems  Constitutional:   Negative for chills and fever.  HENT:  Negative for drooling, ear discharge, ear pain and sore throat.   Respiratory:  Positive for wheezing. Negative for cough and shortness of breath.   Cardiovascular:  Negative for chest pain, palpitations and leg swelling.  Gastrointestinal:  Negative for abdominal pain, blood in stool, bowel incontinence, constipation, diarrhea and nausea.  Endocrine: Negative for polydipsia.  Genitourinary:  Negative for bladder incontinence, dysuria, frequency, hematuria, pelvic pain and urgency.  Musculoskeletal:  Positive for back pain. Negative for myalgias and neck pain.  Skin:  Negative for rash.  Allergic/Immunologic: Negative for environmental allergies.  Neurological:  Positive for tingling and paresthesias. Negative for dizziness and headaches.  Hematological:  Does not bruise/bleed easily.  Psychiatric/Behavioral:  Negative for suicidal ideas. The patient is not nervous/anxious.    Patient Active Problem List   Diagnosis Date Noted   MDD (major depressive disorder), single episode, severe , no psychosis ([place])    MDD (major depressive disorder), recurrent severe, without psychosis ([place]) 12/13/2017   Morbid obesity due to excess calories ([place]) 03/04/2016   Essential hypertension 03/04/2016   Hypersomnia with sleep apnea 01/14/2016   Snoring 01/14/2016    No Known Allergies  Past Surgical History:  Procedure Laterality Date   WISDOM TOOTH EXTRACTION  Social History   Tobacco Use   Smoking status: Never   Smokeless tobacco: Never  Vaping Use   Vaping Use: Never used  Substance Use Topics   Alcohol use: Yes    Alcohol/week: 2.0 standard drinks    Types: 2 Standard drinks or equivalent per week    Comment: occasional   Drug use: No     Medication list has been reviewed and updated.  No outpatient medications have been marked as taking for the 12/10/21 encounter (Office Visit) with Juline Patch, MD.    [facility] 2/9 Scores 11/19/2021  06/25/2021 06/18/2021 06/04/2021  PHQ - 2 Score 0 5 0 0  PHQ- 9 Score 6 22 0 0    GAD 7 : Generalized Anxiety Score 11/19/2021 06/18/2021 06/04/2021 07/16/2020  Nervous, Anxious, on Edge 3 1 0 0  Control/stop worrying 3 0 1 0  Worry too much - different things 1 0 1 0  Trouble relaxing 2 0 1 0  Restless 1 0 0 0  Easily annoyed or irritable 3 0 1 0  Afraid - awful might happen 3 0 0 0  Total GAD 7 Score 16 1 4  0  Anxiety Difficulty Somewhat difficult Not difficult at all Not difficult at all -    BP Readings from Last 3 Encounters:  12/10/21 (!) 146/84  12/10/21 (!) 120/58  11/26/21 106/68    Physical Exam Vitals and nursing note reviewed.  HENT:     Head: Normocephalic.     Right Ear: Tympanic membrane, ear canal and external ear normal.     Left Ear: Tympanic membrane, ear canal and external ear normal.     Nose: Nose normal. No congestion or rhinorrhea.  Eyes:     General: No scleral icterus.       Right eye: No discharge.        Left eye: No discharge.     Conjunctiva/sclera: Conjunctivae normal.     Pupils: Pupils are equal, round, and reactive to light.  Neck:     Thyroid: No thyromegaly.     Vascular: No JVD.     Trachea: No tracheal deviation.  Cardiovascular:     Rate and Rhythm: Normal rate and regular rhythm.     Heart sounds: Normal heart sounds. No murmur heard.   No friction rub. No gallop.  Pulmonary:     Effort: No respiratory distress.     Breath sounds: Normal breath sounds. No wheezing, rhonchi or rales.  Abdominal:     General: Bowel sounds are normal.     Palpations: Abdomen is soft. There is no mass.     Tenderness: There is no abdominal tenderness. There is no guarding or rebound.       Comments: 1 cm/palpable nodularity c/w lipoma  Musculoskeletal:        General: No tenderness. Normal range of motion.     Cervical back: Normal range of motion and neck supple.     Lumbar back: Normal. No swelling, edema, deformity, signs of trauma, lacerations,  spasms, tenderness or bony tenderness. Normal range of motion. No scoliosis.  Lymphadenopathy:     Cervical: No cervical adenopathy.  Skin:    General: Skin is warm.     Findings: No bruising.  Neurological:     Mental Status: Alex [name] is alert.     Sensory: Sensation is intact.     Motor: Motor function is intact.     Deep Tendon Reflexes:     Reflex  Scores:      Patellar reflexes are 2+ on the right side and 2+ on the left side.      Achilles reflexes are 1+ on the right side and 1+ on the left side.   Wt Readings from Last 3 Encounters:  12/10/21 300 lb (136.1 kg)  12/10/21 295 lb (133.8 kg)  11/26/21 296 lb (134.3 kg)    BP (!) 146/84    Pulse 87    Ht 5' 10"  (1.778 m)    Wt 300 lb (136.1 kg)    SpO2 97%    BMI 43.05 kg/m   Assessment and Plan:  1. Lipoma of torso New onset.  Persistent.  Patient has lost inches but not necessarily weight and during examination noted that there is a palpable area of the left flank area consistent with a 1 mm nodularity soft nontender mass consistent with lipoma.  There is no associated splenomegaly or hepatomegaly.  We will continue to observe and note that the size if there is increase in size or rapid change in any way patient will return to the clinic and we will further evaluate or refer to general surgery.  2. Lumbosacral radiculopathy New onset.  Lower back pain radiating to the posterior aspect of both legs to the knee.  There is also some associated paresthesias but no muscle weakness.  During the course of weight loss by dieting patient has also been working out which mostly cardio.  There is minimal weights at this time but range of motion is incorporated in his exercise program there is some paresthesias noted.  We will treat with Mobic 15 mg once a day.  Patient may return if this progresses.  We will continue to observe - meloxicam (MOBIC) 15 MG tablet; Take 1 tablet (15 mg total) by mouth daily.  Dispense: 30 tablet; Refill: 2

## 2021-12-10 NOTE — Progress Notes (Signed)
Chief Complaint:   OBESITY Kedarius is here to discuss ZEFERINO MOUNTS progress with [name] obesity treatment plan along with follow-up of ESAW KNIPPEL obesity related diagnoses. Bradd is on the Category 3 Plan and states YERIEL MINEO is following XAVIAN HARDCASTLE eating plan approximately 75% of the time. Kline states CARNIE BRUEMMER is doing cardio for 30 minutes 3 times per week.  Today's visit was #: 11 Starting weight: 311 lbs Starting date: 06/25/2021 Today's weight: 295 lbs Today's date: 12/10/2021 Total lbs lost to date: 16 Total lbs lost since last in-office visit: 1  Interim History: Draven followed his Category 3 the last few weeks as he has sceptical about doing low carb, given his previous history with it.  He struggles with snacks at work and he tends to eat 600 snack calories instead of 300. He states that he does fine at home with his snack calories. His hunger is controlled.  Subjective:   1. Vitamin D deficiency Stefan is on Vitamin D weekly. His last Vit D level was 49.8. I discussed labs with the patient today.  2. Anxiety and depression Argel is seeing Psych, and he is also seeing Dr. [name]. He is anxious about his lab results today, but he is feeling better after our discussion.  3. At risk for activity intolerance Seymore is at risk for exercise intolerance due to recent COVID, and he is working on increasing endurance.  Assessment/Plan:   1. Vitamin D deficiency We will refill prescription Vitamin D for 1 month. Colt will follow-up for routine testing of Vitamin D, at least 2-3 times per year to avoid over-replacement.  - Vitamin D, Ergocalciferol, (DRISDOL) 1.25 MG (50000 UNIT) CAPS capsule; Take 1 capsule (50,000 Units total) by mouth every 7 (seven) days.  Dispense: 4 capsule; Refill: 0  2. Anxiety and depression Kentavious will follow up with Psych and Dr. [name]. Implemented walking more often and meditation to help  Jacquis deal with his anxiety. Orders and follow up as documented in patient record.   3. At risk for activity intolerance Mayer was given approximately 15 minutes of exercise intolerance counseling today. JAYDEN KRATOCHVIL is 41 y.o. adult and has risk factors exercise intolerance including obesity. We discussed intensive lifestyle modifications today with an emphasis on specific weight loss instructions and strategies. Yobany will slowly increase activity as tolerated.  Repetitive spaced learning was employed today to elicit superior memory formation and behavioral change.  4. Obesity with current BMI of 42.33 Deone is currently in the action stage of change. As such, REVAN GENDRON goal is to continue with weight loss efforts. PRYNCE JACOBER has agreed to the Category 3 Plan.   Exercise goals: As is.  Behavioral modification strategies: meal planning and cooking strategies and better snacking choices.  Adalid has agreed to follow-up with our clinic in 2 weeks. SKANDA WORLDS was informed of the importance of frequent follow-up visits to maximize CLEDITH ABDOU success with intensive lifestyle modifications for KEAGAN BRISLIN multiple health conditions.   Objective:   Blood pressure (!) 120/58, pulse 81, temperature 97.8 F (36.6 C), height 5\' 10"  (1.778 m), weight 295 lb (133.8 kg), SpO2 99 %. Body mass index is 42.33 kg/m.  General: Cooperative, alert, well developed, in no acute distress. HEENT: Conjunctivae and lids unremarkable. Cardiovascular: Regular rhythm.  Lungs: Normal work of breathing. Neurologic: No focal deficits.   Lab Results  Component Value Date   CREATININE  0.99 11/26/2021   BUN 15 11/26/2021   NA 141 11/26/2021   K 4.5 11/26/2021   CL 103 11/26/2021   CO2 24 11/26/2021   Lab Results  Component Value Date   ALT 34 11/26/2021   AST 25 11/26/2021   ALKPHOS 85 11/26/2021   BILITOT 0.4 11/26/2021   Lab Results  Component Value Date    HGBA1C 5.0 11/26/2021   HGBA1C 5.0 06/25/2021   Lab Results  Component Value Date   INSULIN 10.1 11/26/2021   INSULIN 6.5 06/25/2021   Lab Results  Component Value Date   TSH 2.910 11/26/2021   Lab Results  Component Value Date   CHOL 176 11/26/2021   HDL 55 11/26/2021   LDLCALC 110 (H) 11/26/2021   TRIG 57 11/26/2021   CHOLHDL 3.2 03/07/2019   Lab Results  Component Value Date   VD25OH 49.8 11/26/2021   VD25OH 33.0 06/25/2021   Lab Results  Component Value Date   WBC 5.8 11/26/2021   HGB 15.4 11/26/2021   HCT 46.0 11/26/2021   MCV 87 11/26/2021   PLT 180 11/26/2021   Lab Results  Component Value Date   IRON 61 11/26/2021   TIBC 373 11/26/2021   FERRITIN 29 (L) 11/26/2021   Attestation Statements:   Reviewed by clinician on day of visit: allergies, medications, problem list, medical history, surgical history, family history, social history, and previous encounter notes.   Wilhemena Durie, am acting as transcriptionist for [employer], PA-C.  I have reviewed the above documentation for accuracy and completeness, and I agree with the above. Abby Potash, PA-C

## 2021-12-10 NOTE — Patient Instructions (Signed)
Lipoma  A lipoma is a noncancerous (benign) tumor that is made up of fat cells. This is a very common type of soft-tissue growth. Lipomas are usually found under the skin (subcutaneous). They may occur in any tissue of the body that contains fat. Common areas forlipomas to appear include the back, arms, shoulders, buttocks, and thighs. Lipomas grow slowly, and they are usually painless. Most lipomas do not causeproblems and do not require treatment. What are the causes? The cause of this condition is not known. What increases the risk? You are more likely to develop this condition if: You are 40-60 years old. You have a family history of lipomas. What are the signs or symptoms? A lipoma usually appears as a small, round bump under the skin. In most cases, the lump will: Feel soft or rubbery. Not cause pain or other symptoms. However, if a lipoma is located in an area where it pushes on nerves, it canbecome painful or cause other symptoms. How is this diagnosed? A lipoma can usually be diagnosed with a physical exam. You may also have tests to confirm the diagnosis and to rule out other conditions. Tests may include: Imaging tests, such as a CT scan or an MRI. Removal of a tissue sample to be looked at under a microscope (biopsy). How is this treated? Treatment for this condition depends on the size of the lipoma and whether it is causing any symptoms. For small lipomas that are not causing problems, no treatment is needed. If a lipoma is bigger or it causes problems, surgery may be done to remove the lipoma. Lipomas can also be removed to improve appearance. Most often, the procedure is done after applying a medicine that numbs the area (local anesthetic). Liposuction may be done to reduce the size of the lipoma before it is removed through surgery, or it may be done to remove the lipoma. Lipomas are removed with this method in order to limit incision size and scarring. A liposuction tube is  inserted through a small incision into the lipoma, and the contents of the lipoma are removed through the tube with suction. Follow these instructions at home: Watch your lipoma for any changes. Keep all follow-up visits as told by your health care provider. This is important. Contact a health care provider if: Your lipoma becomes larger or hard. Your lipoma becomes painful, red, or increasingly swollen. These could be signs of infection or a more serious condition. Get help right away if: You develop tingling or numbness in an area near the lipoma. This could indicate that the lipoma is causing nerve damage. Summary A lipoma is a noncancerous tumor that is made up of fat cells. Most lipomas do not cause problems and do not require treatment. If a lipoma is bigger or it causes problems, surgery may be done to remove the lipoma. Contact a health care provider if your lipoma becomes larger or hard, or if it becomes painful, red, or increasingly swollen. Pain, redness, and swelling could be signs of infection or a more serious condition. This information is not intended to replace advice given to you by your health care provider. Make sure you discuss any questions you have with your healthcare provider. Document Revised: 05/29/2019 Document Reviewed: 05/29/2019 Elsevier Patient Education  2022 [employer]  

## 2021-12-15 ENCOUNTER — Other Ambulatory Visit: Payer: Self-pay

## 2021-12-15 ENCOUNTER — Encounter: Payer: Self-pay | Admitting: Internal Medicine

## 2021-12-15 ENCOUNTER — Other Ambulatory Visit (HOSPITAL_COMMUNITY): Payer: Self-pay

## 2021-12-15 ENCOUNTER — Ambulatory Visit (INDEPENDENT_AMBULATORY_CARE_PROVIDER_SITE_OTHER): Payer: 59 | Admitting: Internal Medicine

## 2021-12-15 VITALS — BP 126/76 | HR 81 | Temp 98.6°F | Ht 70.0 in | Wt 302.2 lb

## 2021-12-15 DIAGNOSIS — J4530 Mild persistent asthma, uncomplicated: Secondary | ICD-10-CM | POA: Diagnosis not present

## 2021-12-15 DIAGNOSIS — J301 Allergic rhinitis due to pollen: Secondary | ICD-10-CM

## 2021-12-15 DIAGNOSIS — R0602 Shortness of breath: Secondary | ICD-10-CM

## 2021-12-15 DIAGNOSIS — J45990 Exercise induced bronchospasm: Secondary | ICD-10-CM

## 2021-12-15 MED ORDER — FLUTICASONE PROPIONATE HFA 110 MCG/ACT IN AERO
1.0000 | INHALATION_SPRAY | Freq: Two times a day (BID) | RESPIRATORY_TRACT | 5 refills | Status: DC
  Filled 2021-12-15: qty 12, 60d supply, fill #0
  Filled 2022-02-16: qty 12, 60d supply, fill #1
  Filled 2022-05-01: qty 12, 30d supply, fill #2
  Filled 2022-09-18: qty 12, 30d supply, fill #3
  Filled 2022-12-04: qty 12, 30d supply, fill #4

## 2021-12-15 NOTE — Patient Instructions (Addendum)
Please schedule follow up scheduled with myself in 6 weeks.  If my schedule is not open yet, we will contact you with a reminder closer to that time. Please call [phone] if you haven't heard from [place] a month before.   Before your next visit I would like you to have: Arlyce Harman and feno at next visit.   Start taking at zyrtec at night time. Start taking flonase as directed below. Start taking flovent inhaler 1 puff twice a day and gargle after use.   Flonase - 1 spray on each side of your nose twice a day for first week, then 1 spray on each side.   Instructions for use: If you also use a saline nasal spray or rinse, use that first. Position the head with the chin slightly tucked. Use the right hand to spray into the left nostril and the right hand to spray into the left nostril.   Point the bottle away from the septum of your nose (cartilage that divides the two sides of your nose).  Hold the nostril closed on the opposite side from where you will spray Spray once and gently sniff to pull the medicine into the higher parts of your nose.  Don't sniff too hard as the medicine will drain down the back of your throat instead. Repeat with a second spray on the same side if prescribed. Repeat on the other side of your nose.  By learning about asthma and how it can be controlled, you take an important step toward managing this disease. Work closely with your asthma care team to learn all you can about your asthma, how to avoid triggers, what your medications do, and how to take them correctly. With proper care, you can live free of asthma symptoms and maintain a normal, healthy lifestyle.   What is asthma? Asthma is a chronic disease that affects the airways of the lungs. During normal breathing, the bands of muscle that surround the airways are relaxed and air moves freely. During an asthma episode or "attack," there are three main changes that stop air from moving easily through the airways: The  bands of muscle that surround the airways tighten and make the airways narrow. This tightening is called bronchospasm.  The lining of the airways becomes swollen or inflamed.  The cells that line the airways produce more mucus, which is thicker than normal and clogs the airways.  These three factors - bronchospasm, inflammation, and mucus production - cause symptoms such as difficulty breathing, wheezing, and coughing.  What are the most common symptoms of asthma? Asthma symptoms are not the same for everyone. They can even change from episode to episode in the same person. Also, you may have only one symptom of asthma, such as cough, but another person may have all the symptoms of asthma. It is important to know all the symptoms of asthma and to be aware that your asthma can present in any of these ways at any time. The most common symptoms include: Coughing, especially at night  Shortness of breath  Wheezing  Chest tightness, pain, or pressure   Who is affected by asthma? Asthma affects 22 million Americans; about 6 million of these are children under age 71. People who have a family history of asthma have an increased risk of developing the disease. Asthma is also more common in people who have allergies or who are exposed to tobacco smoke. However, anyone can develop asthma at any time. Some people may have asthma  all of their lives, while others may develop it as adults.  What causes asthma? The airways in a person with asthma are very sensitive and react to many things, or "triggers." Contact with these triggers causes asthma symptoms. One of the most important parts of asthma control is to identify your triggers and then avoid them when possible. The only trigger you do not want to avoid is exercise. Pre-treatment with medicines before exercise can allow you to stay active yet avoid asthma symptoms. Common asthma triggers include: Infections (colds, viruses, flu, sinus infections)   Exercise  Weather (changes in temperature and/or humidity, cold air)  Tobacco smoke  Allergens (dust mites, pollens, pets, mold spores, cockroaches, and sometimes foods)  Irritants (strong odors from cleaning products, perfume, wood smoke, air pollution)  Strong emotions such as crying or laughing hard  Some medications   How is asthma diagnosed? To diagnose asthma, your doctor will first review your medical history, family history, and symptoms. Your doctor will want to know any past history of breathing problems you may have had, as well as a family history of asthma, allergies, eczema (a bumpy, itchy skin rash caused by allergies), or other lung disease. It is important that you describe your symptoms in detail (cough, wheeze, shortness of breath, chest tightness), including when and how often they occur. The doctor will perform a physical examination and listen to your heart and lungs. He or she may also order breathing tests, allergy tests, blood tests, and chest and sinus X-rays. The tests will find out if you do have asthma and if there are any other conditions that are contributing factors.  How is asthma treated? Asthma can be controlled, but not cured. It is not normal to have frequent symptoms, trouble sleeping, or trouble completing tasks. Appropriate asthma care will prevent symptoms and visits to the emergency room and hospital. Asthma medicines are one of the mainstays of asthma treatment. The drugs used to treat asthma are explained below.  Anti-inflammatories: These are the most important drugs for most people with asthma. Anti-inflammatory drugs reduce swelling and mucus production in the airways. As a result, airways are less sensitive and less likely to react to triggers. These medications need to be taken daily and may need to be taken for several weeks before they begin to control asthma. Anti-inflammatory medicines lead to fewer symptoms, better airflow, less sensitive  airways, less airway damage, and fewer asthma attacks. If taken every day, they CONTROL or prevent asthma symptoms.   Bronchodilators: These drugs relax the muscle bands that tighten around the airways. This action opens the airways, letting more air in and out of the lungs and improving breathing. Bronchodilators also help clear mucus from the lungs. As the airways open, the mucus moves more freely and can be coughed out more easily. In short-acting forms, bronchodilators RELIEVE or stop asthma symptoms by quickly opening the airways and are very helpful during an asthma episode. In long-acting forms, bronchodilators provide CONTROL of asthma symptoms and prevent asthma episodes.  Asthma drugs can be taken in a variety of ways. Inhaling the medications by using a metered dose inhaler, dry powder inhaler, or nebulizer is one way of taking asthma medicines. Oral medicines (pills or liquids you swallow) may also be prescribed.  Asthma severity Asthma is classified as either "intermittent" (comes and goes) or "persistent" (lasting). Persistent asthma is further described as being mild, moderate, or severe. The severity of asthma is based on how often you have symptoms both  during the day and night, as well as by the results of lung function tests and by how well you can perform activities. The "severity" of asthma refers to how "intense" or "strong" your asthma is.  Asthma control Asthma control is the goal of asthma treatment. Regardless of your asthma severity, it may or may not be controlled. Asthma control means: You are able to do everything you want to do at work and home  You have no (or minimal) asthma symptoms  You do not wake up from your sleep or earlier than usual in the morning due to asthma  You rarely need to use your reliever medicine (inhaler)  Another major part of your treatment is that you are happy with your asthma care and believe your asthma is controlled.  Monitoring symptoms A  key part of treatment is keeping track of how well your lungs are working. Monitoring your symptoms  what they are, how and when they happen, and how severe they are  is an important part of being able to control your asthma.  Sometimes asthma is monitored using a peak flow meter. A peak flow (PF) meter measures how fast the air comes out of your lungs. It can help you know when your asthma is getting worse, sometimes even before you have symptoms. By taking daily peak flow readings, you can learn when to adjust medications to keep asthma under good control. It is also used to create your asthma action plan (see below). Your doctor can use your peak flow readings to adjust your treatment plan in some cases.  Asthma Action Plan Based on your history and asthma severity, you and your doctor will develop a care plan called an asthma action plan. The asthma action plan describes when and how to use your medicines, actions to take when asthma worsens, and when to seek emergency care. Make sure you understand this plan. If you do not, ask your asthma care provider any questions you may have. Your asthma action plan is one of the keys to controlling asthma. Keep it readily available to remind you of what you need to do every day to control asthma and what you need to do when symptoms occur.  Goals of asthma therapy These are the goals of asthma treatment: Live an active, normal life  Prevent chronic and troublesome symptoms  Attend work or school every day  Perform daily activities without difficulty  Stop urgent visits to the doctor, emergency department, or hospital  Use and adjust medications to control asthma with few or no side effects

## 2021-12-15 NOTE — Progress Notes (Signed)
SAHAS SLUKA    [id number]    Dec 02, 1980  Primary Care Physician:Jones, Iven Finn, MD  Referring Physician: Juline Patch, MD [address] [place],  [place] [address] Reason for Consultation: shortness of breath after covid Date of Consultation: 12/15/2021  Chief complaint:   Chief Complaint  Patient presents with   Consult    Had Covid in 2020 and Jan of 2023.  Possibly had a 3rd time.  Sob, dry cough Hx exercise induced asthma and allergy induced asthma.     HPI: ABASS MISENER "Larkin Ina"  is a 41 y.o. who presents for new patient evaluation for shortness of breath after having covid infection in July 2022. History of exercise induced asthma which is usually resolved with albuterol prior to exercise. Trigger - cold weather, exercise vigorously.   After covid had chest pain, shortness of breath, joint pains, swelling in the legs.  Daughter had URI and they had covid gain in December 2022. Had flu-like symptoms and did ok for about a week. For the past month he has had worsening shortness of breath, stabbing chest pain and asthma symptoms. Up to 3 times a day, resolved by albuterol.  Has been checking O2 sats during these episodes which shows usually normal sats but bradycardia (down to 50s, from normal in the 70s)  Having back pain, tingling in his legs. PCP put him on meloxicam which has helped.   He has been sleeping with a wedge pillow and his O2 sats have been trending up since before  Was tested for sleep apnea in lab 15 years ago and was told he didn't have it. (Home test was inconclusive) He was heavier then than he is now and has recently been starting to lose weight.   He works as an Haematologist at [employer].   Has anxiety and depression. Takes hydroxyzine for panic attacks prn.   Has yellowish phelgm with cough, worse in the morning. Cough does not wake him up at night. Does a lot of throat clearing and post nasal drainage. Has chronic allergies - takes  allegra once a day all year round.   Did allergy testing and had SCIT while in college - multiple environmental allergens.    Social history:  Occupation: OR Marine scientist Exposures: lives at home with wife and daughter. Originally from [place], no pets recently, he is allergic to cats.  Smoking history: never smoker, passive smoke exposure in childhood.   Social History   Occupational History   Not on file  Tobacco Use   Smoking status: Never   Smokeless tobacco: Never  Vaping Use   Vaping Use: Never used  Substance and Sexual Activity   Alcohol use: Yes    Alcohol/week: 2.0 standard drinks    Types: 2 Standard drinks or equivalent per week    Comment: occasional   Drug use: No   Sexual activity: Yes    Relevant family history:  Family History  Problem Relation Age of Onset   Anxiety disorder Mother    Depression Mother    Obesity Mother    Obesity Father    Hyperlipidemia Father    Hypertension Father    Depression Father    Anxiety disorder Father    Asthma Father    Cancer Maternal Grandfather    Emphysema Maternal Grandfather    Cancer Paternal Grandfather     Past Medical History:  Diagnosis Date   Anxiety  Arterial embolism of left leg (HCC)    Asthma    exercise-induced   Constipation    Depression    Infertility male    Palpitation    SOB (shortness of breath)    Vitamin D deficiency     Past Surgical History:  Procedure Laterality Date   WISDOM TOOTH EXTRACTION       Physical Exam: Blood pressure 126/76, pulse 81, temperature 98.6 F (37 C), temperature source Oral, height 5\' 10"  (1.778 m), weight (!) 302 lb 3.2 oz (137.1 kg), SpO2 98 %. Gen:      No acute distress ENT:  no nasal polyps, mucus membranes moist, mild erythema and nasal debris, mallmapti I Lungs:    No increased respiratory effort, symmetric chest wall excursion, clear to auscultation bilaterally, no wheezes or crackles CV:         Regular rate and rhythm; no murmurs, rubs,  or gallops.  No pedal edema Abd:      + bowel sounds; soft, non-tender; no distension MSK: no acute synovitis of DIP or PIP joints, no mechanics hands.  Skin:      Warm and dry; no rashes Neuro: normal speech, no focal facial asymmetry Psych: alert and oriented x3, normal mood and affect. anxious   Data Reviewed/Medical Decision Making:  Independent interpretation of tests: Imaging:  Review of patient's CT Cardiac August 2022 lung window  images revealed no acute process. The patient's images have been independently reviewed by me.  Otherwise was low risk for Coronary calcium score  PFTs: I have personally reviewed the patient's PFTs and  No flowsheet data found.  Labs: TSH 2.9 Lab Results  Component Value Date   WBC 5.8 11/26/2021   HGB 15.4 11/26/2021   HCT 46.0 11/26/2021   MCV 87 11/26/2021   PLT 180 11/26/2021   Lab Results  Component Value Date   NA 141 11/26/2021   K 4.5 11/26/2021   CL 103 11/26/2021   CO2 24 11/26/2021     Immunization status:  Immunization History  Administered Date(s) Administered   Influenza,inj,Quad PF,6+ Mos 06/25/2021   PFIZER Comirnaty(Gray Top)Covid-19 Tri-Sucrose Vaccine 11/06/2019, 11/24/2019, 10/03/2020     I reviewed prior external note(s) from family medicine  I reviewed the result(s) of the labs and imaging as noted above.   I have ordered PFT   Assessment:  Shortness of breath, likely worsening asthma History of Asthma, exercise induced Covid 19 infection, recent Allergic rhinitis, perennial   Plan/Recommendations: New start flovent HFA 1 puff BID for worsening asthma. Continue prn albuterol Add flonase for rhinitis and add zyrtec at nighttime. Cautioned on anti-cholinergic toxicity with hydroxyzine, allegra and cetirizine. Arlyce Harman and feno at next visit.   We discussed disease management and progression at length today.    Return to Care: Return in about 6 weeks (around 01/26/2022).  Lenice Llamas, MD Pulmonary  and [place]  CC: Juline Patch, MD

## 2021-12-16 ENCOUNTER — Other Ambulatory Visit (HOSPITAL_COMMUNITY): Payer: Self-pay

## 2021-12-17 ENCOUNTER — Other Ambulatory Visit (HOSPITAL_COMMUNITY): Payer: Self-pay

## 2021-12-19 ENCOUNTER — Other Ambulatory Visit (HOSPITAL_COMMUNITY): Payer: Self-pay

## 2021-12-20 ENCOUNTER — Other Ambulatory Visit: Payer: Self-pay

## 2021-12-20 DIAGNOSIS — M7989 Other specified soft tissue disorders: Secondary | ICD-10-CM

## 2021-12-22 ENCOUNTER — Other Ambulatory Visit (HOSPITAL_COMMUNITY): Payer: Self-pay

## 2021-12-22 DIAGNOSIS — K625 Hemorrhage of anus and rectum: Secondary | ICD-10-CM | POA: Insufficient documentation

## 2021-12-22 DIAGNOSIS — R197 Diarrhea, unspecified: Secondary | ICD-10-CM | POA: Insufficient documentation

## 2021-12-23 ENCOUNTER — Other Ambulatory Visit (HOSPITAL_COMMUNITY): Payer: Self-pay

## 2021-12-23 ENCOUNTER — Ambulatory Visit (HOSPITAL_COMMUNITY)
Admission: RE | Admit: 2021-12-23 | Discharge: 2021-12-23 | Disposition: A | Discharge status: Home / Self Care | Payer: 59 | Source: Ambulatory Visit | Attending: Vascular Surgery | Admitting: Vascular Surgery

## 2021-12-23 ENCOUNTER — Ambulatory Visit (INDEPENDENT_AMBULATORY_CARE_PROVIDER_SITE_OTHER): Payer: 59 | Admitting: Physician Assistant

## 2021-12-23 ENCOUNTER — Telehealth (INDEPENDENT_AMBULATORY_CARE_PROVIDER_SITE_OTHER): Payer: 59 | Admitting: Psychology

## 2021-12-23 ENCOUNTER — Other Ambulatory Visit: Payer: Self-pay

## 2021-12-23 VITALS — BP 130/67 | HR 83 | Temp 98.3°F | Resp 18 | Ht 71.0 in | Wt 304.0 lb

## 2021-12-23 DIAGNOSIS — F3289 Other specified depressive episodes: Secondary | ICD-10-CM

## 2021-12-23 DIAGNOSIS — M7989 Other specified soft tissue disorders: Secondary | ICD-10-CM | POA: Insufficient documentation

## 2021-12-23 DIAGNOSIS — F419 Anxiety disorder, unspecified: Secondary | ICD-10-CM | POA: Diagnosis not present

## 2021-12-23 DIAGNOSIS — I872 Venous insufficiency (chronic) (peripheral): Secondary | ICD-10-CM | POA: Diagnosis not present

## 2021-12-23 NOTE — Progress Notes (Signed)
[facility] [place]   Reason for referral: Swollen left leg  History of Present Illness  Alex [name] is a 41 y.o. adult who presents with chief complaint: swollen leg.  Patient notes, onset of swelling 2 years ago, associated with prolonged sitting.  The patient has had no history of DVT, no history of varicose vein, no history of venous stasis ulcers, no history of  Lymphedema and no history of skin changes in lower legs.  There is no family history of venous disorders.  The patient has used knee high compression stockings in the past.   He states he has had 2 bouts of COVID and he has noticed the edema since then about 2 years now.  He denise skin changes, non healing wounds, rest pain or claudication.  He is active doing cardio for 30 minutes 3 times per week and working on a healthy eating program.  He denise DM or CAD.   Past Medical History:  Diagnosis Date   Anxiety    Arterial embolism of left leg (HCC)    Asthma    exercise-induced   Constipation    Depression    Infertility male    Palpitation    SOB (shortness of breath)    Vitamin D deficiency     Past Surgical History:  Procedure Laterality Date   WISDOM TOOTH EXTRACTION      Social History   Socioeconomic History   Marital status: Married    Spouse name: Not on file   Number of children: Not on file   Years of education: Not on file   Highest education level: Not on file  Occupational History   Not on file  Tobacco Use   Smoking status: Never   Smokeless tobacco: Never  Vaping Use   Vaping Use: Never used  Substance and Sexual Activity   Alcohol use: Yes    Alcohol/week: 2.0 standard drinks    Types: 2 Standard drinks or equivalent per week    Comment: occasional   Drug use: No   Sexual activity: Yes  Other Topics Concern   Not on file  Social History Narrative   Drinks 3 cups of caffeine daily.   Social Determinants of Health   Financial Resource Strain: Not on file   Food Insecurity: Not on file  Transportation Needs: Not on file  Physical Activity: Not on file  Stress: Not on file  Social Connections: Not on file  Intimate Partner Violence: Not on file    Family History  Problem Relation Age of Onset   Anxiety disorder Mother    Depression Mother    Obesity Mother    Obesity Father    Hyperlipidemia Father    Hypertension Father    Depression Father    Anxiety disorder Father    Asthma Father    Cancer Maternal Grandfather    Emphysema Maternal Grandfather    Cancer Paternal Grandfather     Current Outpatient Medications on File Prior to Visit  Medication Sig Dispense Refill   albuterol (VENTOLIN HFA) 108 (90 Base) MCG/ACT inhaler Inhale 1-2 puffs into the lungs every 6 (six) hours as needed for wheezing or shortness of breath. 8.5 g 11   busPIRone (BUSPAR) 15 MG tablet Take 1 tablet by mouth 3 times daily for anxiety 90 tablet 2   Fexofenadine HCl (ALLEGRA PO) Take by mouth.     fluticasone (FLOVENT HFA) 110 MCG/ACT inhaler Inhale 1 puff into the lungs in the  morning and at bedtime. 12 g 5   hydrOXYzine (ATARAX) 25 MG tablet Take 1 tablet (25 mg total) daily as needed for anxiety. Take 2 tablets (50 mg total) by mouth at bedtime as needed for sleep 30 tablet 2   meloxicam (MOBIC) 15 MG tablet Take 1 tablet (15 mg total) by mouth daily. 30 tablet 2   Multiple Vitamin (MULTIVITAMIN) tablet Take 1 tablet by mouth daily.     Probiotic Product (PROBIOTIC DAILY PO) Take by mouth.     Vitamin D, Ergocalciferol, (DRISDOL) 1.25 MG (50000 UNIT) CAPS capsule Take 1 capsule (50,000 Units total) by mouth every 7 (seven) days. 4 capsule 0   No current facility-administered medications on file prior to visit.    Allergies as of 12/23/2021   (No Known Allergies)     ROS:   General:  No weight loss, Fever, chills  HEENT: No recent headaches, no nasal bleeding, no visual changes, no sore throat  Neurologic: No dizziness, blackouts, seizures.  No recent symptoms of stroke or mini- stroke. No recent episodes of slurred speech, or temporary blindness.  Cardiac: No recent episodes of chest pain/pressure, no shortness of breath at rest.  No shortness of breath with exertion.  Denies history of atrial fibrillation or irregular heartbeat  Vascular: No history of rest pain in feet.  No history of claudication.  No history of non-healing ulcer, No history of DVT   Pulmonary: No home oxygen, no productive cough, no hemoptysis,  No asthma or wheezing  Musculoskeletal:  [ ]  Arthritis, [ ]  Low back pain,  [ ]  Joint pain  Hematologic:No history of hypercoagulable state.  No history of easy bleeding.  No history of anemia  Gastrointestinal: No hematochezia or melena,  No gastroesophageal reflux, no trouble swallowing  Urinary: [ ]  chronic Kidney disease, [ ]  on HD - [ ]  MWF or [ ]  TTHS, [ ]  Burning with urination, [ ]  Frequent urination, [ ]  Difficulty urinating;   Skin: No rashes  Psychological: positive history of anxiety,  positive history of depression  Physical Examination  Vitals:   12/23/21 0948  BP: 130/67  Pulse: 83  Resp: 18  Temp: 98.3 F (36.8 C)  TempSrc: Temporal  SpO2: 100%  Weight: (!) 304 lb (137.9 kg)  Height: 5\' 11"  (1.803 m)    Body mass index is 42.4 kg/m.  General:  Alert and oriented, no acute distress HEENT: Normal Neck: No bruit or JVD Pulmonary: Clear to auscultation bilaterally Cardiac: Regular Rate and Rhythm without murmur Abdomen: Soft, non-tender, non-distended, no mass, no scars Skin: No rash Extremity Pulses:  2+ radial, brachial, femoral, dorsalis pedis, posterior tibial pulses bilaterally Musculoskeletal: No deformity or edema  Neurologic: Upper and lower extremity motor 5/5 and symmetric  DATA:    +--------------+---------+------+-----------+------------+--------+   LEFT           Reflux No Reflux Reflux Time Diameter cms Comments                              Yes                                        +--------------+---------+------+-----------+------------+--------+   CFV                       yes    >1 second                          +--------------+---------+------+-----------+------------+--------+  FV prox                   yes    >1 second                          +--------------+---------+------+-----------+------------+--------+   FV mid         no                                        770 ms     +--------------+---------+------+-----------+------------+--------+   FV dist        no                                                   +--------------+---------+------+-----------+------------+--------+   Popliteal      no                                                   +--------------+---------+------+-----------+------------+--------+   GSV at SFJ                yes     >500 ms      0.918                +--------------+---------+------+-----------+------------+--------+   GSV prox thigh            yes     >500 ms      0.379                +--------------+---------+------+-----------+------------+--------+   GSV mid thigh             yes     >500 ms      0.243                +--------------+---------+------+-----------+------------+--------+   GSV dist thigh            yes     >500 ms      0.356                +--------------+---------+------+-----------+------------+--------+   GSV at knee    no                              0.258                +--------------+---------+------+-----------+------------+--------+   GSV prox calf                                  0.205                +--------------+---------+------+-----------+------------+--------+   SSV Pop Fossa  no                              0.237                +--------------+---------+------+-----------+------------+--------+   SSV prox calf  no  0.188                +--------------+---------+------+-----------+------------+--------+   SSV mid calf   no                               0.215                +--------------+---------+------+-----------+------------+--------+          Summary:  Left:  - No evidence of deep vein thrombosis seen in the left lower extremity,  from the common femoral through the popliteal veins.  - No evidence of superficial venous thrombosis in the left lower  extremity.  - Deep vein reflux in the CFV and FV.  - Superficial vein reflux in the SFJ and GSV.     Assessment: Venous reflux left LE He has both deep and GSV reflux.  The SFJ is positive for reflux and throughout the thigh GSV has reflux.  The vein size is < 0.4 cm throughout the GSV.  He does not qualify for intervention at this time.   He has palpable pedal pulses and is not at risk of limb loss.     Plan:He will continue to wear daily knee high compression, exercise, elevate when at rest.  He has a seated job and I suggested getting up and moving around his work area every 30-40 minuets.  If he develops new symptoms or skin changes chronic edema, increased edema or non healing wound he will call.  Otherwise he will f/u as needed.   Roxy Horseman PA-C  [facility] [place] [facility] Office: [phone] Pager: [phone]

## 2021-12-26 ENCOUNTER — Other Ambulatory Visit (HOSPITAL_COMMUNITY): Payer: Self-pay

## 2021-12-30 ENCOUNTER — Ambulatory Visit (INDEPENDENT_AMBULATORY_CARE_PROVIDER_SITE_OTHER): Payer: 59 | Admitting: Family Medicine

## 2022-01-07 ENCOUNTER — Other Ambulatory Visit (HOSPITAL_COMMUNITY): Payer: Self-pay

## 2022-01-07 DIAGNOSIS — F331 Major depressive disorder, recurrent, moderate: Secondary | ICD-10-CM | POA: Insufficient documentation

## 2022-01-07 DIAGNOSIS — K640 First degree hemorrhoids: Secondary | ICD-10-CM | POA: Diagnosis not present

## 2022-01-07 DIAGNOSIS — F411 Generalized anxiety disorder: Secondary | ICD-10-CM | POA: Diagnosis not present

## 2022-01-07 MED ORDER — BUSPIRONE HCL 15 MG PO TABS
15.0000 mg | ORAL_TABLET | Freq: Three times a day (TID) | ORAL | 0 refills | Status: DC
  Filled 2022-01-07: qty 90, 30d supply, fill #0

## 2022-01-07 MED ORDER — DULOXETINE HCL 30 MG PO CPEP
30.0000 mg | ORAL_CAPSULE | Freq: Every day | ORAL | 0 refills | Status: DC
  Filled 2022-01-07: qty 15, 15d supply, fill #0

## 2022-01-13 ENCOUNTER — Encounter (INDEPENDENT_AMBULATORY_CARE_PROVIDER_SITE_OTHER): Payer: Self-pay | Admitting: Family Medicine

## 2022-01-13 ENCOUNTER — Other Ambulatory Visit: Payer: Self-pay

## 2022-01-13 ENCOUNTER — Ambulatory Visit (INDEPENDENT_AMBULATORY_CARE_PROVIDER_SITE_OTHER): Payer: 59 | Admitting: Family Medicine

## 2022-01-13 ENCOUNTER — Other Ambulatory Visit (HOSPITAL_COMMUNITY): Payer: Self-pay

## 2022-01-13 VITALS — BP 114/62 | HR 74 | Temp 98.1°F | Ht 71.0 in | Wt 301.0 lb

## 2022-01-13 DIAGNOSIS — E669 Obesity, unspecified: Secondary | ICD-10-CM | POA: Diagnosis not present

## 2022-01-13 DIAGNOSIS — Z6841 Body Mass Index (BMI) 40.0 and over, adult: Secondary | ICD-10-CM | POA: Diagnosis not present

## 2022-01-13 DIAGNOSIS — E559 Vitamin D deficiency, unspecified: Secondary | ICD-10-CM | POA: Diagnosis not present

## 2022-01-13 MED ORDER — SEMAGLUTIDE-WEIGHT MANAGEMENT 0.25 MG/0.5ML ~~LOC~~ SOAJ
0.2500 mg | SUBCUTANEOUS | 0 refills | Status: DC
  Filled 2022-01-13 – 2022-01-21 (×3): qty 2, 28d supply, fill #0

## 2022-01-15 ENCOUNTER — Encounter (INDEPENDENT_AMBULATORY_CARE_PROVIDER_SITE_OTHER): Payer: Self-pay | Admitting: Family Medicine

## 2022-01-15 ENCOUNTER — Other Ambulatory Visit (HOSPITAL_COMMUNITY): Payer: Self-pay

## 2022-01-15 NOTE — Progress Notes (Signed)
? ? ? ?Chief Complaint:  ? ?OBESITY ?[place] is here to discuss MANSA WILLERS progress with Christian Mate Pinkerton's obesity treatment plan along with follow-up of ANTRON SETH obesity related diagnoses. Tauheed is on the Category 3 Plan and states NANDAN WILLEMS is following FRISCO CORDTS eating plan approximately 35% of the time. Mariah states ARON INGE is doing 0 minutes 0 times per week. ? ?Today's visit was #: 12 ?Starting weight: 311 lbs ?Starting date: 06/25/2021 ?Today's weight: 301 lbs ?Today's date: 01/13/2022 ?Total lbs lost to date: 10 ?Total lbs lost since last in-office visit: 0 ? ?Interim History: Azar has had a lot of stress especially with work changes. He feels he will have a better work/life balance now, and will be abl to concentrate on his weight loss now. He is open to looking at medications to help him. He denies a history of pancreatitis or medullary thyroid cancer. ? ?Subjective:  ? ?1. Vitamin D deficiency ?Lucien's last Vitamin D level was almost at goal. He has no signs of over-replacement.  ? ?Assessment/Plan:  ? ?1. Vitamin D deficiency ?Davaris will continue take prescription Vitamin D 50,000 IU every week and we will recheck labs in 2 months. He will follow-up for routine testing of Vitamin D, at least 2-3 times per year to avoid over-replacement. ? ?2. Obesity with current BMI of 43.2 ?Jayzen is currently in the action stage of change. As such, MCLEAN MOYA goal is to continue with weight loss efforts. TRYSTIAN CRISANTO has agreed to the Category 3 Plan.  ? ?We discussed various medication options to help Edsel with [name] weight loss efforts and we both agreed to start Wegovy 0.25 mg q week with no refills. ? ?- Semaglutide-Weight Management 0.25 MG/0.5ML SOAJ; Inject 0.25 mg into the skin once a week.  Dispense: 2 mL; Refill: 0 ? ?Behavioral modification strategies: decreasing simple carbohydrates and no skipping meals. ? ?Rafik has agreed to  follow-up with our clinic in 3 weeks. RAYVEN HENDRICKSON was informed of the importance of frequent follow-up visits to maximize DARRYEL DIODATO success with intensive lifestyle modifications for RUFFUS KAMAKA multiple health conditions.  ? ?Objective:  ? ?Blood pressure 114/62, pulse 74, temperature 98.1 ?F (36.7 ?C), height '5\' 11"'$  (1.803 m), weight (!) 301 lb (136.5 kg), SpO2 98 %. ?Body mass index is 41.98 kg/m?. ? ?General: Cooperative, alert, well developed, in no acute distress. ?HEENT: Conjunctivae and lids unremarkable. ?Cardiovascular: Regular rhythm.  ?Lungs: Normal work of breathing. ?Neurologic: No focal deficits.  ? ?Lab Results  ?Component Value Date  ? CREATININE 0.99 11/26/2021  ? BUN 15 11/26/2021  ? NA 141 11/26/2021  ? K 4.5 11/26/2021  ? CL 103 11/26/2021  ? CO2 24 11/26/2021  ? ?Lab Results  ?Component Value Date  ? ALT 34 11/26/2021  ? AST 25 11/26/2021  ? ALKPHOS 85 11/26/2021  ? BILITOT 0.4 11/26/2021  ? ?Lab Results  ?Component Value Date  ? HGBA1C 5.0 11/26/2021  ? HGBA1C 5.0 06/25/2021  ? ?Lab Results  ?Component Value Date  ? INSULIN 10.1 11/26/2021  ? INSULIN 6.5 06/25/2021  ? ?Lab Results  ?Component Value Date  ? TSH 2.910 11/26/2021  ? ?Lab Results  ?Component Value Date  ? CHOL 176 11/26/2021  ? HDL 55 11/26/2021  ? LDLCALC 110 (H) 11/26/2021  ? TRIG 57 11/26/2021  ? CHOLHDL 3.2 03/07/2019  ? ?Lab Results  ?Component Value Date  ? VD25OH 49.8 11/26/2021  ?  VD25OH 33.0 06/25/2021  ? ?Lab Results  ?Component Value Date  ? WBC 5.8 11/26/2021  ? HGB 15.4 11/26/2021  ? HCT 46.0 11/26/2021  ? MCV 87 11/26/2021  ? PLT 180 11/26/2021  ? ?Lab Results  ?Component Value Date  ? IRON 61 11/26/2021  ? TIBC 373 11/26/2021  ? FERRITIN 29 (L) 11/26/2021  ? ?Attestation Statements:  ? ?Reviewed by clinician on day of visit: allergies, medications, problem list, medical history, surgical history, family history, social history, and previous encounter notes. ? ?Time spent on visit including  pre-visit chart review and post-visit care and charting was 32 minutes.  ? ? ?I, Trixie Dredge, am acting as transcriptionist for [name], MD. ? ?I have reviewed the above documentation for accuracy and completeness, and I agree with the above. -  [name], MD ? ? ?

## 2022-01-21 ENCOUNTER — Telehealth (INDEPENDENT_AMBULATORY_CARE_PROVIDER_SITE_OTHER): Payer: Self-pay | Admitting: Family Medicine

## 2022-01-21 ENCOUNTER — Other Ambulatory Visit (HOSPITAL_COMMUNITY): Payer: Self-pay

## 2022-01-21 DIAGNOSIS — F411 Generalized anxiety disorder: Secondary | ICD-10-CM | POA: Diagnosis not present

## 2022-01-21 DIAGNOSIS — F331 Major depressive disorder, recurrent, moderate: Secondary | ICD-10-CM | POA: Diagnosis not present

## 2022-01-21 DIAGNOSIS — K640 First degree hemorrhoids: Secondary | ICD-10-CM | POA: Diagnosis not present

## 2022-01-21 MED ORDER — DULOXETINE HCL 30 MG PO CPEP
30.0000 mg | ORAL_CAPSULE | Freq: Every day | ORAL | 0 refills | Status: DC
  Filled 2022-01-21: qty 30, 30d supply, fill #0

## 2022-01-21 MED ORDER — BUSPIRONE HCL 15 MG PO TABS
15.0000 mg | ORAL_TABLET | Freq: Three times a day (TID) | ORAL | 0 refills | Status: DC
  Filled 2022-01-21: qty 90, 30d supply, fill #0

## 2022-01-21 NOTE — Telephone Encounter (Signed)
Please see message and advise.  Thank you. ° °

## 2022-01-21 NOTE — Telephone Encounter (Signed)
Prior authorization approved for [facility]. Effective: 01/16/2022 to 04/16/2022. Patient sent approval message via mychart.  ?

## 2022-01-22 DIAGNOSIS — F331 Major depressive disorder, recurrent, moderate: Secondary | ICD-10-CM | POA: Diagnosis not present

## 2022-01-22 DIAGNOSIS — F411 Generalized anxiety disorder: Secondary | ICD-10-CM | POA: Diagnosis not present

## 2022-02-05 ENCOUNTER — Ambulatory Visit (INDEPENDENT_AMBULATORY_CARE_PROVIDER_SITE_OTHER): Payer: 59 | Admitting: Family Medicine

## 2022-02-05 ENCOUNTER — Encounter (INDEPENDENT_AMBULATORY_CARE_PROVIDER_SITE_OTHER): Payer: Self-pay | Admitting: Family Medicine

## 2022-02-05 ENCOUNTER — Other Ambulatory Visit (HOSPITAL_COMMUNITY): Payer: Self-pay

## 2022-02-05 VITALS — BP 112/69 | HR 71 | Temp 98.3°F | Ht 71.0 in | Wt 301.0 lb

## 2022-02-05 DIAGNOSIS — E559 Vitamin D deficiency, unspecified: Secondary | ICD-10-CM | POA: Diagnosis not present

## 2022-02-05 DIAGNOSIS — Z9189 Other specified personal risk factors, not elsewhere classified: Secondary | ICD-10-CM

## 2022-02-05 DIAGNOSIS — Z6841 Body Mass Index (BMI) 40.0 and over, adult: Secondary | ICD-10-CM

## 2022-02-05 DIAGNOSIS — E669 Obesity, unspecified: Secondary | ICD-10-CM

## 2022-02-05 MED ORDER — VITAMIN D (ERGOCALCIFEROL) 1.25 MG (50000 UNIT) PO CAPS
50000.0000 [IU] | ORAL_CAPSULE | ORAL | 0 refills | Status: DC
  Filled 2022-02-05: qty 4, 28d supply, fill #0

## 2022-02-11 DIAGNOSIS — K640 First degree hemorrhoids: Secondary | ICD-10-CM | POA: Diagnosis not present

## 2022-02-12 NOTE — Progress Notes (Signed)
? ? ? ?Chief Complaint:  ? ?OBESITY ?Alex [name] is here to discuss OMER PUCCINELLI progress with Christian Mate Arzola's obesity treatment plan along with follow-up of ELIO HADEN obesity related diagnoses. Kobey is on the Category 3 Plan and states DEKLAN MINAR is following BLANE WORTHINGTON eating plan approximately 50% of the time. Bandy states AVYN COATE is doing 0 minutes 0 times per week. ? ?Today's visit was #: 13 ?Starting weight: 311 lbs ?Starting date: 06/25/2021 ?Today's weight: 301 lbs ?Today's date: 02/05/2022 ?Total lbs lost to date: 10 ?Total lbs lost since last in-office visit: 0 ? ?Interim History: Oluwadamilola was unable to start [facility] due to not yet meeting his deductible. He had some celebration eating  over Easter, but he is ready to get back on track.  ? ?Subjective:  ? ?1. Vitamin D deficiency ?Sriyan is stable on Vit D prescription. He denies nausea or vomiting.  ? ?2. At risk for heart disease ?Lena is at higher than average risk for cardiovascular disease due to obesity. ? ?Assessment/Plan:  ? ?1. Vitamin D deficiency ?We will refill prescription Vitamin D for 1 month. Hayk will follow-up for routine testing of Vitamin D, at least 2-3 times per year to avoid over-replacement. ? ?- Vitamin D, Ergocalciferol, (DRISDOL) 1.25 MG (50000 UNIT) CAPS capsule; Take 1 capsule (50,000 Units total) by mouth every 7 (seven) days.  Dispense: 4 capsule; Refill: 0 ? ?2. At risk for heart disease ?Kenston was given approximately 15 minutes of coronary artery disease prevention counseling today. JAMERION CABELLO is 41 y.o. adult and has risk factors for heart disease including obesity. We discussed intensive lifestyle modifications today with an emphasis on specific weight loss instructions and strategies. ? ?Repetitive spaced learning was employed today to elicit superior memory formation and behavioral change.  ? ?3. Obesity with current BMI of 42.0 ?Rondle is currently in the action stage of  change. As such, DOLPH TAVANO goal is to continue with weight loss efforts. AENGUS SAUCEDA has agreed to change to keeping a food journal and adhering to recommended goals of 1400-1700 calories and 100+ grams of protein daily.  ? ?Behavioral modification strategies: increasing lean protein intake and keeping a strict food journal. ? ?Zakhi has agreed to follow-up with our clinic in 3 weeks. HERVE HAUG was informed of the importance of frequent follow-up visits to maximize [facility] success with intensive lifestyle modifications for COADY TRAIN multiple health conditions.  ? ?Objective:  ? ?Blood pressure 112/69, pulse 71, temperature 98.3 ?F (36.8 ?C), height '5\' 11"'$  (1.803 m), weight (!) 301 lb (136.5 kg), SpO2 99 %. ?Body mass index is 41.98 kg/m?. ? ?General: Cooperative, alert, well developed, in no acute distress. ?HEENT: Conjunctivae and lids unremarkable. ?Cardiovascular: Regular rhythm.  ?Lungs: Normal work of breathing. ?Neurologic: No focal deficits.  ? ?Lab Results  ?Component Value Date  ? CREATININE 0.99 11/26/2021  ? BUN 15 11/26/2021  ? NA 141 11/26/2021  ? K 4.5 11/26/2021  ? CL 103 11/26/2021  ? CO2 24 11/26/2021  ? ?Lab Results  ?Component Value Date  ? ALT 34 11/26/2021  ? AST 25 11/26/2021  ? ALKPHOS 85 11/26/2021  ? BILITOT 0.4 11/26/2021  ? ?Lab Results  ?Component Value Date  ? HGBA1C 5.0 11/26/2021  ? HGBA1C 5.0 06/25/2021  ? ?Lab Results  ?Component Value Date  ? INSULIN 10.1 11/26/2021  ? INSULIN 6.5 06/25/2021  ? ?Lab Results  ?Component Value Date  ?  TSH 2.910 11/26/2021  ? ?Lab Results  ?Component Value Date  ? CHOL 176 11/26/2021  ? HDL 55 11/26/2021  ? LDLCALC 110 (H) 11/26/2021  ? TRIG 57 11/26/2021  ? CHOLHDL 3.2 03/07/2019  ? ?Lab Results  ?Component Value Date  ? VD25OH 49.8 11/26/2021  ? VD25OH 33.0 06/25/2021  ? ?Lab Results  ?Component Value Date  ? WBC 5.8 11/26/2021  ? HGB 15.4 11/26/2021  ? HCT 46.0 11/26/2021  ? MCV 87 11/26/2021  ? PLT 180  11/26/2021  ? ?Lab Results  ?Component Value Date  ? IRON 61 11/26/2021  ? TIBC 373 11/26/2021  ? FERRITIN 29 (L) 11/26/2021  ? ?Attestation Statements:  ? ?Reviewed by clinician on day of visit: allergies, medications, problem list, medical history, surgical history, family history, social history, and previous encounter notes. ? ? ?I, Trixie Dredge, am acting as transcriptionist for [name], MD. ? ?I have reviewed the above documentation for accuracy and completeness, and I agree with the above. -  [name], MD ? ? ?

## 2022-02-16 ENCOUNTER — Other Ambulatory Visit (HOSPITAL_COMMUNITY): Payer: Self-pay

## 2022-02-17 DIAGNOSIS — F411 Generalized anxiety disorder: Secondary | ICD-10-CM | POA: Diagnosis not present

## 2022-02-17 DIAGNOSIS — F331 Major depressive disorder, recurrent, moderate: Secondary | ICD-10-CM | POA: Diagnosis not present

## 2022-02-18 ENCOUNTER — Other Ambulatory Visit (HOSPITAL_COMMUNITY): Payer: Self-pay

## 2022-02-18 ENCOUNTER — Encounter: Payer: Self-pay | Admitting: Internal Medicine

## 2022-02-18 ENCOUNTER — Ambulatory Visit (INDEPENDENT_AMBULATORY_CARE_PROVIDER_SITE_OTHER): Payer: 59 | Admitting: Internal Medicine

## 2022-02-18 VITALS — BP 110/70 | HR 77 | Temp 97.9°F | Ht 71.0 in | Wt 302.0 lb

## 2022-02-18 DIAGNOSIS — R059 Cough, unspecified: Secondary | ICD-10-CM | POA: Diagnosis not present

## 2022-02-18 DIAGNOSIS — F411 Generalized anxiety disorder: Secondary | ICD-10-CM | POA: Diagnosis not present

## 2022-02-18 DIAGNOSIS — F331 Major depressive disorder, recurrent, moderate: Secondary | ICD-10-CM | POA: Diagnosis not present

## 2022-02-18 LAB — NITRIC OXIDE: Nitric Oxide: 15

## 2022-02-18 MED ORDER — DULOXETINE HCL 30 MG PO CPEP
30.0000 mg | ORAL_CAPSULE | Freq: Every day | ORAL | 0 refills | Status: DC
  Filled 2022-02-18: qty 90, 90d supply, fill #0

## 2022-02-18 MED ORDER — BUSPIRONE HCL 15 MG PO TABS
15.0000 mg | ORAL_TABLET | Freq: Three times a day (TID) | ORAL | 0 refills | Status: DC
  Filled 2022-02-18: qty 270, 90d supply, fill #0

## 2022-02-18 NOTE — Patient Instructions (Addendum)
Please schedule follow up scheduled with myself in 6 months.  If my schedule is not open yet, we will contact you with a reminder closer to that time. Please call [phone] if you haven't heard from [place] a month before.  ? ?Continue your advair 1 puff twice a day. Gargle after use. You can try a spacer if you have voice hoarseness.  ? ?Take the albuterol rescue inhaler every 4 to 6 hours as needed for wheezing or shortness of breath. ?You can also take it 15 minutes before exercise or exertional activity. ?Side effects include heart racing or pounding, jitters or anxiety. If you have a history of an irregular heart rhythm, it can make this worse. Can also give some patients a hard time sleeping. ? ?Let me know if any issues with your breathing, or if allergy symptoms worsen . ? ?

## 2022-02-18 NOTE — Addendum Note (Signed)
Addended by: Mathis Bud on: 02/18/2022 10:23 AM ? ? Modules accepted: Orders ? ?

## 2022-02-18 NOTE — Progress Notes (Signed)
? ?      ?Alex [name]    [id number]    07-19-1981 ? ?Primary Care Physician:Jones, Iven Finn, MD ?Date of Appointment: 02/18/2022 ?Established Patient Visit ? ?Chief complaint:   ?Chief Complaint  ?Patient presents with  ? Follow-up  ?  Sob-better, dry cough occass.  ? ? ? ?HPI: ?BABY STAIRS is a 41 y.o. male with history of asthma with worsening symptoms.  ? ?Interval Updates: ?Here for follow up after starting maintenance inhaler with advair HFA.  ? ?Allergy issues doing ok this spring. - taking nasacort twice a day.  ? ?Current Regimen: advair hfa 1 puff bid. Albuterol three times a week before exercise.  ?Asthma Triggers: URIs, exertion, cold weather ?Exacerbations in the last year: non requiring steroids ?History of hospitalization or intubation: never ?Allergy Testing: none ?GERD: denies ?Allergic Rhinitis: yes, controlled ?ACT:  ?Asthma Control Test ACT Total Score  ?02/18/2022 ? 9:05 AM 18  ?12/15/2021 ?10:49 AM 13  ? ?FeNO: 15 ppb ? ? ?I have reviewed the patient's family social and past medical history and updated as appropriate.  ? ?Past Medical History:  ?Diagnosis Date  ? Anxiety   ? Arterial embolism of left leg (HCC)   ? Asthma   ? exercise-induced  ? Constipation   ? Depression   ? Infertility male   ? Palpitation   ? SOB (shortness of breath)   ? Vitamin D deficiency   ? ? ?Past Surgical History:  ?Procedure Laterality Date  ? WISDOM TOOTH EXTRACTION    ? ? ?Family History  ?Problem Relation Age of Onset  ? Anxiety disorder Mother   ? Depression Mother   ? Obesity Mother   ? Obesity Father   ? Hyperlipidemia Father   ? Hypertension Father   ? Depression Father   ? Anxiety disorder Father   ? Asthma Father   ? Cancer Maternal Grandfather   ? Emphysema Maternal Grandfather   ? Cancer Paternal Grandfather   ? ? ?Social History  ? ?Occupational History  ? Not on file  ?Tobacco Use  ? Smoking status: Never  ? Smokeless tobacco: Never  ?Vaping Use  ? Vaping Use: Never used  ?Substance and Sexual  Activity  ? Alcohol use: Yes  ?  Alcohol/week: 2.0 standard drinks  ?  Types: 2 Standard drinks or equivalent per week  ?  Comment: occasional  ? Drug use: No  ? Sexual activity: Yes  ? ? ? ?Physical Exam: ?Blood pressure 110/70, pulse 77, temperature 97.9 ?F (36.6 ?C), temperature source Temporal, height '5\' 11"'$  (1.803 m), weight (!) 302 lb (137 kg), SpO2 98 %. ? ?Gen:      No acute distress ?ENT:  no nasal polyps, mucus membranes moist ?Lungs:    No increased respiratory effort, symmetric chest wall excursion, clear to auscultation bilaterally, \no wheezes or crackles ?CV:         Regular rate and rhythm; no murmurs, rubs, or gallops.  No pedal edema ? ? ?Data Reviewed: ?Imaging: ?I have personally reviewed the ct cardiac scoring lung windows - no acute process.  ? ?PFTs: ? ?   ? View : No data to display.  ?  ?  ?  ? ?I have personally reviewed the patient's PFTs and spirometry obtained today shows normal spirometry.  ? ?Labs: ?Lab Results  ?Component Value Date  ? WBC 5.8 11/26/2021  ? HGB 15.4 11/26/2021  ? HCT 46.0 11/26/2021  ? MCV 87 11/26/2021  ? PLT  180 11/26/2021  ? ?Lab Results  ?Component Value Date  ? NA 141 11/26/2021  ? K 4.5 11/26/2021  ? CL 103 11/26/2021  ? CO2 24 11/26/2021  ? ? ?Immunization status: ?Immunization History  ?Administered Date(s) Administered  ? Influenza,inj,Quad PF,6+ Mos 06/25/2021  ? Influenza-Unspecified 07/28/2019  ? PFIZER Comirnaty(Gray Top)Covid-19 Tri-Sucrose Vaccine 11/06/2019, 11/24/2019, 10/03/2020  ? ? ?External Records Personally Reviewed: PCP, vascular surgery ? ?Assessment:  ?Moderate persistent asthma, improved control ?Chronic allergic rhinitis ? ?Plan/Recommendations: ?Continue advair 1 puff BID. Use spacer if voice hoarseness persists ?Continue prn albuterol ?Continue intranasal steroids. May need to add anti-histamine in the fall.  ? ? ?Return to Care: ?Return in about 6 months (around 08/20/2022). ? ? ?Lenice Llamas, MD ?Pulmonary and Critical Care  Medicine ?[place] ?Office:[phone] ? ? ? ? ? ?

## 2022-02-24 ENCOUNTER — Ambulatory Visit (INDEPENDENT_AMBULATORY_CARE_PROVIDER_SITE_OTHER): Payer: 59 | Admitting: Family Medicine

## 2022-02-24 ENCOUNTER — Encounter (INDEPENDENT_AMBULATORY_CARE_PROVIDER_SITE_OTHER): Payer: Self-pay | Admitting: Family Medicine

## 2022-02-24 ENCOUNTER — Other Ambulatory Visit (INDEPENDENT_AMBULATORY_CARE_PROVIDER_SITE_OTHER): Payer: Self-pay | Admitting: Family Medicine

## 2022-02-24 ENCOUNTER — Other Ambulatory Visit (HOSPITAL_COMMUNITY): Payer: Self-pay

## 2022-02-24 VITALS — BP 117/68 | HR 56 | Temp 97.8°F | Ht 71.0 in | Wt 291.0 lb

## 2022-02-24 DIAGNOSIS — E669 Obesity, unspecified: Secondary | ICD-10-CM

## 2022-02-24 DIAGNOSIS — Z6841 Body Mass Index (BMI) 40.0 and over, adult: Secondary | ICD-10-CM | POA: Diagnosis not present

## 2022-02-24 DIAGNOSIS — Z9189 Other specified personal risk factors, not elsewhere classified: Secondary | ICD-10-CM

## 2022-02-24 DIAGNOSIS — R5383 Other fatigue: Secondary | ICD-10-CM

## 2022-02-24 DIAGNOSIS — E559 Vitamin D deficiency, unspecified: Secondary | ICD-10-CM

## 2022-02-24 MED ORDER — VITAMIN D (ERGOCALCIFEROL) 1.25 MG (50000 UNIT) PO CAPS
50000.0000 [IU] | ORAL_CAPSULE | ORAL | 0 refills | Status: DC
  Filled 2022-02-24 – 2022-04-03 (×2): qty 4, 28d supply, fill #0

## 2022-02-26 ENCOUNTER — Other Ambulatory Visit (HOSPITAL_COMMUNITY): Payer: Self-pay

## 2022-02-27 ENCOUNTER — Other Ambulatory Visit (HOSPITAL_COMMUNITY): Payer: Self-pay

## 2022-03-09 ENCOUNTER — Other Ambulatory Visit (HOSPITAL_COMMUNITY): Payer: Self-pay

## 2022-03-11 NOTE — Progress Notes (Signed)
Chief Complaint:   OBESITY Alex [name] is here to discuss Alex [name] progress with Alex [name] obesity treatment plan along with follow-up of Alex [name] obesity related diagnoses. Ethridge is on keeping a food journal and adhering to recommended goals of 1400-1700 calories and 100+ grams of protein daily and states Alex [name] is following Alex [name] eating plan approximately 80% of the time. Alex [name] states Alex [name] is lifting weights and cardio for 45 minutes 2 times per week.  Today's visit was #: 14 Starting weight: 311 lbs Starting date: 06/25/2021 Today's weight: 291 lbs Today's date: 02/24/2022 Total lbs lost to date: 20 Total lbs lost since last in-office visit: 10  Interim History: Alex [name] continues to do well with weight loss. He is doing especially well with meal planning and prepping. His hunger is mostly controlled.   Subjective:   1. Other fatigue Alex [name] feels he is doing better with sleeping. His energy has improved and his work stress has improved.   2. Vitamin D deficiency Alex [name] stable on Vitamin D, with no side effects noted.   3. At risk for impaired metabolic function Alex [name] is at increased risk for impaired metabolic function due to current nutrition and muscle mass.  Assessment/Plan:   1. Other fatigue Alex [name] is to continue with his diet and exercise, and he will continue to follow up.   2. Vitamin D deficiency We will refill prescription Vitamin D for 1 month. Alex [name] will follow-up for routine testing of Vitamin D, at least 2-3 times per year to avoid over-replacement.  - Vitamin D, Ergocalciferol, (DRISDOL) 1.25 MG (50000 UNIT) CAPS capsule; Take 1 capsule (50,000 Units total) by mouth every 7 (seven) days.  Dispense: 4 capsule; Refill: 0  3. At risk for impaired metabolic function Alex [name] was given approximately 15 minutes of impaired  metabolic function prevention counseling today. We discussed intensive  lifestyle modifications today with an emphasis on specific nutrition and exercise instructions and strategies.   Repetitive spaced learning was employed today to elicit superior memory formation and behavioral change.  4. Obesity, Current BMI 40.7 Alex [name] is currently in the action stage of change. As such, Alex [name] goal is to continue with weight loss efforts. Alex [name] has agreed to keeping a food journal and adhering to recommended goals of 1400-1700 calories and 100+ grams of protein daily.   Exercise goals: As is.   Behavioral modification strategies: increasing lean protein intake.  Alex [name] has agreed to follow-up with our clinic in 3 weeks. Alex [name] was informed of the importance of frequent follow-up visits to maximize Alex [name] success with intensive lifestyle modifications for VASHAWN EKSTEIN multiple health conditions.   Objective:   Blood pressure 117/68, pulse (!) 56, temperature 97.8 F (36.6 C), height '5\' 11"'$  (1.803 m), weight 291 lb (132 kg), SpO2 100 %. Body mass index is 40.59 kg/m.  General: Cooperative, alert, well developed, in no acute distress. HEENT: Conjunctivae and lids unremarkable. Cardiovascular: Regular rhythm.  Lungs: Normal work of breathing. Neurologic: No focal deficits.   Lab Results  Component Value Date   CREATININE 0.99 11/26/2021   BUN 15 11/26/2021   NA 141 11/26/2021   K 4.5 11/26/2021   CL 103 11/26/2021   CO2 24 11/26/2021   Lab Results  Component Value Date   ALT 34 11/26/2021   AST 25 11/26/2021   ALKPHOS 85 11/26/2021   BILITOT 0.4 11/26/2021   Lab Results  Component Value Date   HGBA1C 5.0 11/26/2021   HGBA1C 5.0 06/25/2021   Lab Results  Component Value Date   INSULIN 10.1 11/26/2021   INSULIN 6.5 06/25/2021   Lab Results  Component Value Date   TSH 2.910 11/26/2021   Lab Results  Component Value Date   CHOL 176 11/26/2021   HDL 55 11/26/2021   LDLCALC 110 (H) 11/26/2021   TRIG  57 11/26/2021   CHOLHDL 3.2 03/07/2019   Lab Results  Component Value Date   VD25OH 49.8 11/26/2021   VD25OH 33.0 06/25/2021   Lab Results  Component Value Date   WBC 5.8 11/26/2021   HGB 15.4 11/26/2021   HCT 46.0 11/26/2021   MCV 87 11/26/2021   PLT 180 11/26/2021   Lab Results  Component Value Date   IRON 61 11/26/2021   TIBC 373 11/26/2021   FERRITIN 29 (L) 11/26/2021   Attestation Statements:   Reviewed by clinician on day of visit: allergies, medications, problem list, medical history, surgical history, family history, social history, and previous encounter notes.   I, Trixie Dredge, am acting as transcriptionist for [name], MD.  I have reviewed the above documentation for accuracy and completeness, and I agree with the above. -  [name], MD

## 2022-03-17 ENCOUNTER — Encounter (INDEPENDENT_AMBULATORY_CARE_PROVIDER_SITE_OTHER): Payer: Self-pay | Admitting: Family Medicine

## 2022-03-17 ENCOUNTER — Ambulatory Visit (INDEPENDENT_AMBULATORY_CARE_PROVIDER_SITE_OTHER): Payer: 59 | Admitting: Family Medicine

## 2022-03-17 VITALS — BP 113/69 | HR 85 | Temp 97.7°F | Ht 71.0 in | Wt 296.0 lb

## 2022-03-17 DIAGNOSIS — E7849 Other hyperlipidemia: Secondary | ICD-10-CM

## 2022-03-17 DIAGNOSIS — E538 Deficiency of other specified B group vitamins: Secondary | ICD-10-CM | POA: Diagnosis not present

## 2022-03-17 DIAGNOSIS — E669 Obesity, unspecified: Secondary | ICD-10-CM | POA: Diagnosis not present

## 2022-03-17 DIAGNOSIS — E8881 Metabolic syndrome: Secondary | ICD-10-CM | POA: Diagnosis not present

## 2022-03-17 DIAGNOSIS — Z6841 Body Mass Index (BMI) 40.0 and over, adult: Secondary | ICD-10-CM | POA: Diagnosis not present

## 2022-03-17 DIAGNOSIS — E559 Vitamin D deficiency, unspecified: Secondary | ICD-10-CM | POA: Diagnosis not present

## 2022-03-17 DIAGNOSIS — E88819 Insulin resistance, unspecified: Secondary | ICD-10-CM

## 2022-03-17 DIAGNOSIS — Z9189 Other specified personal risk factors, not elsewhere classified: Secondary | ICD-10-CM

## 2022-03-18 LAB — CBC WITH DIFFERENTIAL/PLATELET
Basophils Absolute: 0 10*3/uL (ref 0.0–0.2)
Basos: 0 %
EOS (ABSOLUTE): 0 10*3/uL (ref 0.0–0.4)
Eos: 1 %
Hematocrit: 45.7 % (ref 37.5–51.0)
Hemoglobin: 15.3 g/dL (ref 13.0–17.7)
Immature Grans (Abs): 0 10*3/uL (ref 0.0–0.1)
Immature Granulocytes: 1 %
Lymphocytes Absolute: 1.4 10*3/uL (ref 0.7–3.1)
Lymphs: 22 %
MCH: 29.4 pg (ref 26.6–33.0)
MCHC: 33.5 g/dL (ref 31.5–35.7)
MCV: 88 fL (ref 79–97)
Monocytes Absolute: 0.5 10*3/uL (ref 0.1–0.9)
Monocytes: 7 %
Neutrophils Absolute: 4.5 10*3/uL (ref 1.4–7.0)
Neutrophils: 69 %
Platelets: 200 10*3/uL (ref 150–450)
RBC: 5.2 x10E6/uL (ref 4.14–5.80)
RDW: 12.7 % (ref 11.6–15.4)
WBC: 6.4 10*3/uL (ref 3.4–10.8)

## 2022-03-18 LAB — CMP14+EGFR
ALT: 34 IU/L (ref 0–44)
AST: 30 IU/L (ref 0–40)
Albumin/Globulin Ratio: 1.7 (ref 1.2–2.2)
Albumin: 4.2 g/dL (ref 4.0–5.0)
Alkaline Phosphatase: 78 IU/L (ref 44–121)
BUN/Creatinine Ratio: 14 (ref 9–20)
BUN: 13 mg/dL (ref 6–20)
Bilirubin Total: 0.4 mg/dL (ref 0.0–1.2)
CO2: 22 mmol/L (ref 20–29)
Calcium: 9.3 mg/dL (ref 8.7–10.2)
Chloride: 99 mmol/L (ref 96–106)
Creatinine, Ser: 0.93 mg/dL (ref 0.76–1.27)
Globulin, Total: 2.5 g/dL (ref 1.5–4.5)
Glucose: 86 mg/dL (ref 70–99)
Potassium: 4.5 mmol/L (ref 3.5–5.2)
Sodium: 136 mmol/L (ref 134–144)
Total Protein: 6.7 g/dL (ref 6.0–8.5)
eGFR: 108 mL/min/{1.73_m2} (ref >59)

## 2022-03-18 LAB — VITAMIN B12: Vitamin B-12: 854 pg/mL (ref 232–1245)

## 2022-03-18 LAB — LIPID PANEL WITH LDL/HDL RATIO
Cholesterol, Total: 167 mg/dL (ref 100–199)
HDL: 54 mg/dL (ref >39)
LDL Chol Calc (NIH): 101 mg/dL — ABNORMAL HIGH (ref 0–99)
LDL/HDL Ratio: 1.9 ratio (ref 0.0–3.6)
Triglycerides: 64 mg/dL (ref 0–149)
VLDL Cholesterol Cal: 12 mg/dL (ref 5–40)

## 2022-03-18 LAB — VITAMIN D 25 HYDROXY (VIT D DEFICIENCY, FRACTURES): Vit D, 25-Hydroxy: 59.7 ng/mL (ref 30.0–100.0)

## 2022-03-18 LAB — HEMOGLOBIN A1C
Est. average glucose Bld gHb Est-mCnc: 94 mg/dL
Hgb A1c MFr Bld: 4.9 % (ref 4.8–5.6)

## 2022-03-18 LAB — INSULIN, RANDOM: INSULIN: 10 u[IU]/mL (ref 2.6–24.9)

## 2022-03-26 IMAGING — CR DG CHEST 2V
3 series · 3 of 3 positions shown · non-contrast
Comparison: None.

CLINICAL DATA: cough/dyspnea/with exercise/ post covid

EXAM:
CHEST - 2 VIEW

[chest pa (1 of 2)]
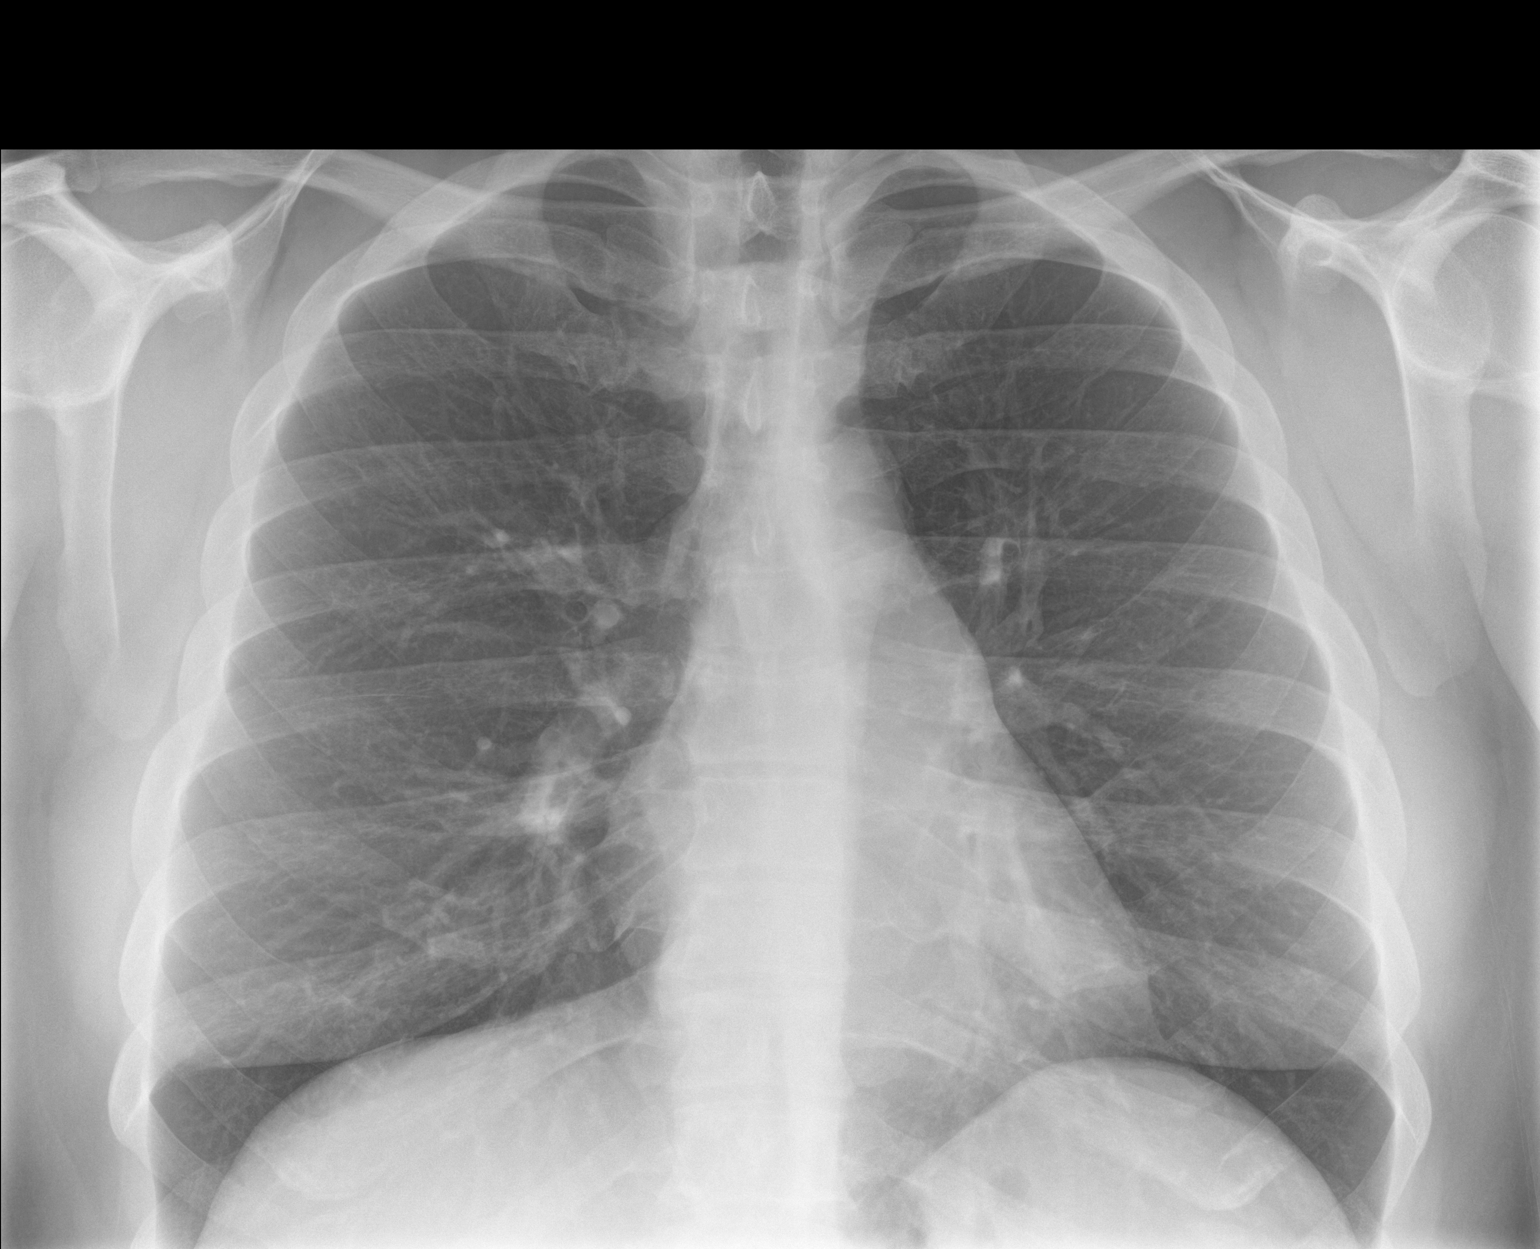

[chest lat]
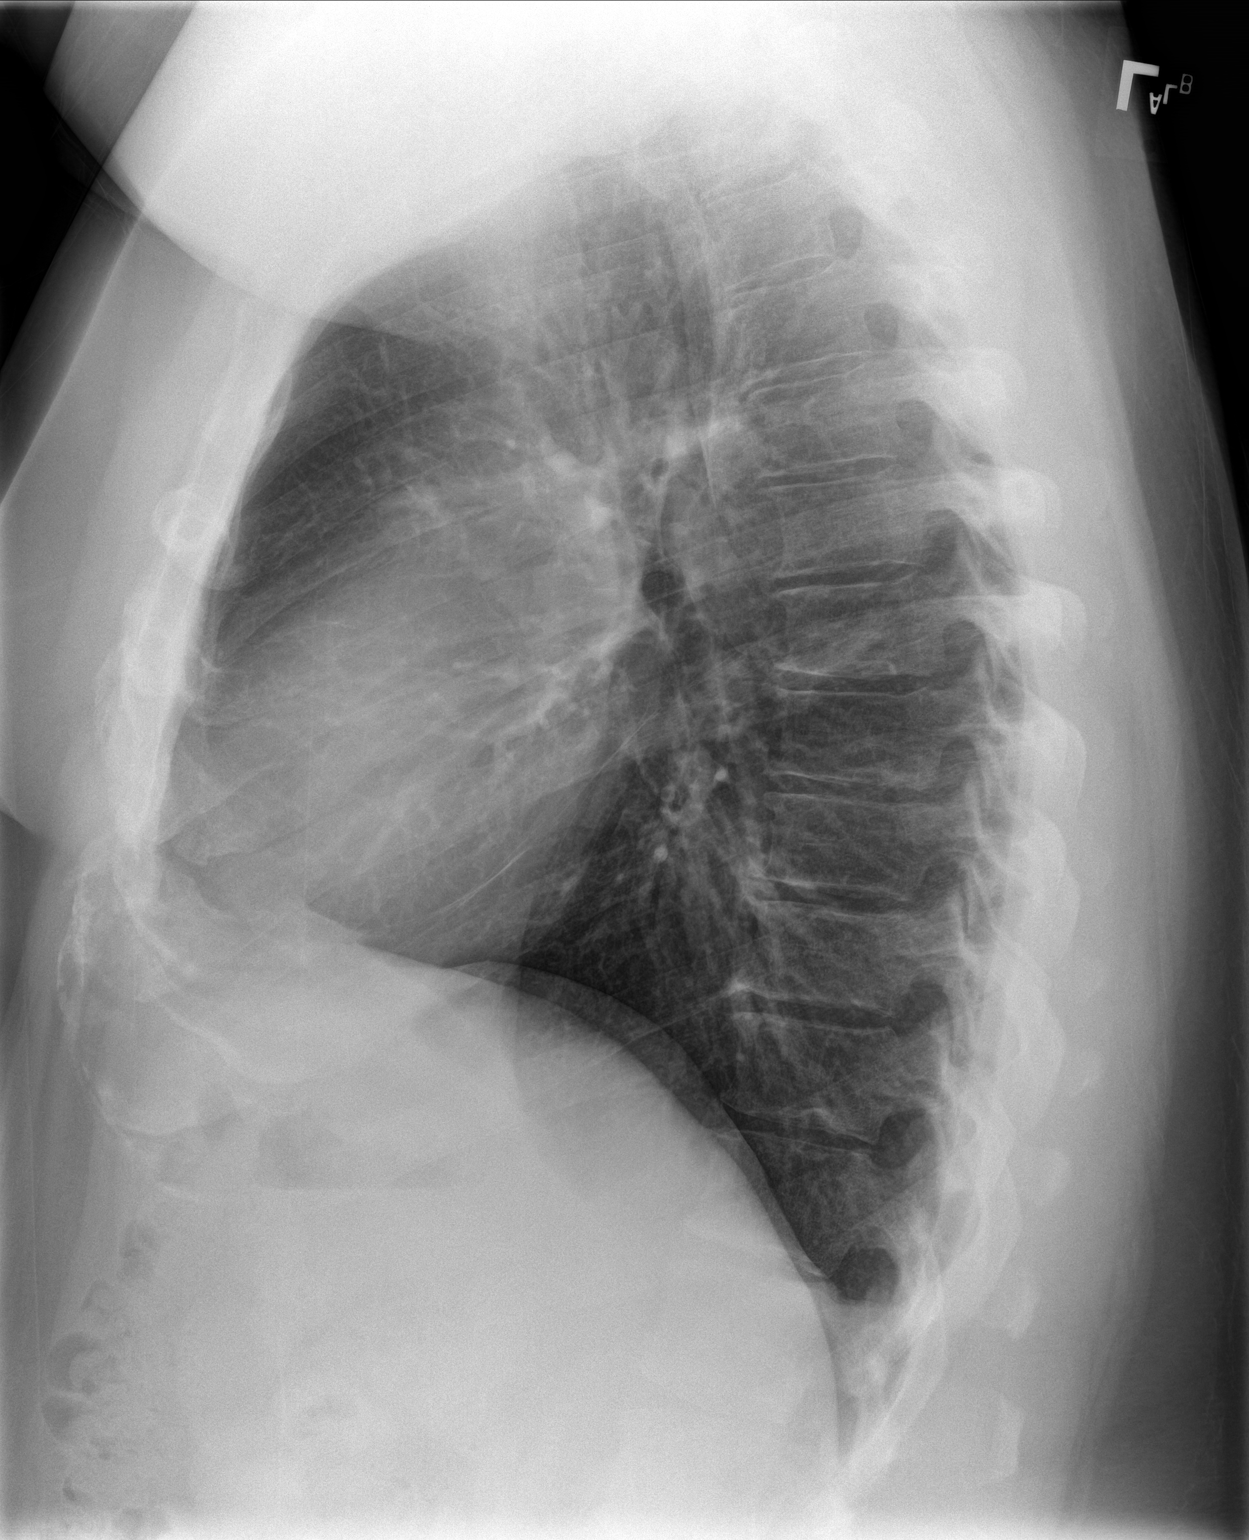

[chest pa (2 of 2)]
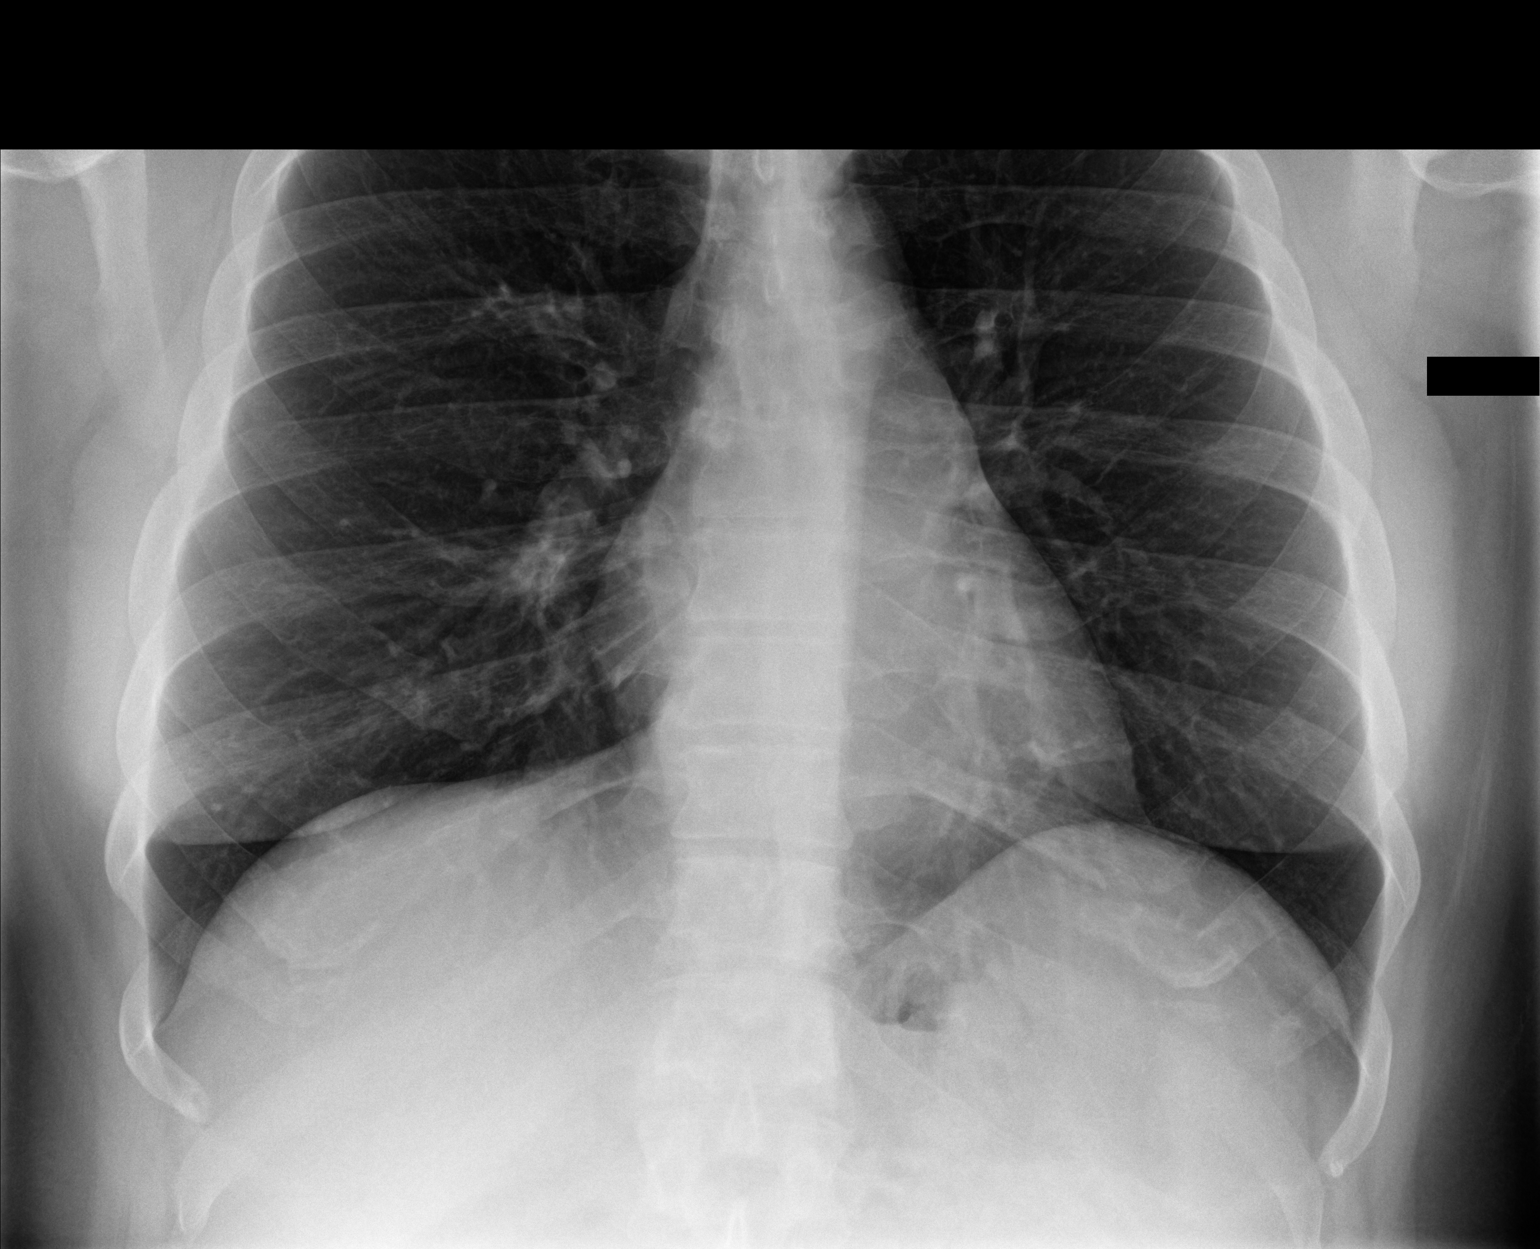

[3 of 3 positions shown; findings below may reference images not displayed]

FINDINGS: The heart size and mediastinal contours are within normal limits.
Both lungs are clear. The visualized skeletal structures are
unremarkable.
IMPRESSION: No active cardiopulmonary disease.

## 2022-03-30 NOTE — Progress Notes (Signed)
Chief Complaint:   OBESITY Alex [name] is here to discuss Alex [name] progress with Alex [name] obesity treatment plan along with follow-up of Alex [name] obesity related diagnoses. Alex [name] is on keeping a food journal and adhering to recommended goals of 1400-1700 calories and 100+ grams of protein daily and states Alex [name] is following Alex [name] eating plan approximately 40% of the time. Alex [name] states Alex [name] is lifting weights and doing cardio for 90 minutes 3 times per week.  Today's visit was #: 15 Starting weight: 311 lbs Starting date: 06/25/2021 Today's weight: 296 lbs Today's date: 03/17/2022 Total lbs lost to date: 15 Total lbs lost since last in-office visit: 0  Interim History: [name] is back in Summer school. He is struggling with weight loss, and he notes his hunger has increased with an increase in exercise.   Subjective:   1. Other hyperlipidemia Alex [name] is working on his diet and exercise, and he is due for labs.  2. Vitamin D deficiency Alex [name] is on Vitamin D, and his last level was at goal.   3. Insulin resistance Alex [name] notes increased polyphagia and some weight gain. This may be worsening.   4. Vitamin B12 deficiency Bereket is working on his B12 rich diet, and he is due for labs.   5. At risk for diabetes mellitus Alex [name] is at higher than average risk for developing diabetes due to Alex [name] obesity.  Assessment/Plan:   1. Other hyperlipidemia Cardiovascular risk and specific lipid/LDL goals reviewed.  We discussed several lifestyle modifications today. We will check labs today. Alex [name] will continue to work on diet, exercise and weight loss efforts. Orders and follow up as documented in patient record.   - Lipid Panel With LDL/HDL Ratio  2. Vitamin D deficiency We will check labs today. Alex [name] will follow-up for routine testing of Vitamin D, at least 2-3 times per year to avoid  over-replacement.  - VITAMIN D 25 Hydroxy (Vit-D Deficiency, Fractures)  3. Insulin resistance We will check labs today. Alex [name] will continue to work on weight loss, exercise, and decreasing simple carbohydrates to help decrease the risk of diabetes. Alex [name] agreed to follow-up with [place] as directed to closely monitor Alex [name] progress.  - CMP14+EGFR - Insulin, random - Hemoglobin A1c  4. Vitamin B12 deficiency The diagnosis was reviewed with the patient. We will check labs today, and we will follow up at his next visit. Orders and follow up as documented in patient record.  - CBC with Differential/Platelet - Vitamin B12  5. At risk for diabetes mellitus Alex [name] was given approximately 15 minutes of diabetic education and counseling today. We discussed intensive lifestyle modifications today with an emphasis on weight loss as well as increasing exercise and decreasing simple carbohydrates in Alex [name]. We also reviewed medication options with an emphasis on risk versus benefits of those discussed.  Repetitive spaced learning was employed today to elicit superior memory formation and behavioral change.  6. Obesity, Current BMI 41.4 Alex [name] is currently in the action stage of change. As such, Alex [name] goal is to continue with weight loss efforts. Alex [name] has agreed to change to the Category 4 Plan.   Exercise goals: Alex [name] is to decrease his exercise to 50 minutes but 4 times per week.   Behavioral modification strategies: increasing lean protein intake.  Alex [name] has agreed to follow-up with our clinic in 3 weeks. Alex [name] was informed of  the importance of frequent follow-up visits to maximize Alex [name] success with intensive lifestyle modifications for Alex [name] multiple health conditions.   Alex [name] was informed we would discuss ARYN [name] lab results at Alex [name] next visit unless there is a critical issue  that needs to be addressed sooner. Alex [name] agreed to keep Alex [name]'s next visit at the agreed upon time to discuss these results.  Objective:   Blood pressure 113/69, pulse 85, temperature 97.7 F (36.5 C), height 5' 11"  (1.803 m), weight 296 lb (134.3 kg), SpO2 99 %. Body mass index is 41.28 kg/m.  General: Cooperative, alert, well developed, in no acute distress. HEENT: Conjunctivae and lids unremarkable. Cardiovascular: Regular rhythm.  Lungs: Normal work of breathing. Neurologic: No focal deficits.   Lab Results  Component Value Date   CREATININE 0.93 03/17/2022   BUN 13 03/17/2022   NA 136 03/17/2022   K 4.5 03/17/2022   CL 99 03/17/2022   CO2 22 03/17/2022   Lab Results  Component Value Date   ALT 34 03/17/2022   AST 30 03/17/2022   ALKPHOS 78 03/17/2022   BILITOT 0.4 03/17/2022   Lab Results  Component Value Date   HGBA1C 4.9 03/17/2022   HGBA1C 5.0 11/26/2021   HGBA1C 5.0 06/25/2021   Lab Results  Component Value Date   INSULIN 10.0 03/17/2022   INSULIN 10.1 11/26/2021   INSULIN 6.5 06/25/2021   Lab Results  Component Value Date   TSH 2.910 11/26/2021   Lab Results  Component Value Date   CHOL 167 03/17/2022   HDL 54 03/17/2022   LDLCALC 101 (H) 03/17/2022   TRIG 64 03/17/2022   CHOLHDL 3.2 03/07/2019   Lab Results  Component Value Date   VD25OH 59.7 03/17/2022   VD25OH 49.8 11/26/2021   VD25OH 33.0 06/25/2021   Lab Results  Component Value Date   WBC 6.4 03/17/2022   HGB 15.3 03/17/2022   HCT 45.7 03/17/2022   MCV 88 03/17/2022   PLT 200 03/17/2022   Lab Results  Component Value Date   IRON 61 11/26/2021   TIBC 373 11/26/2021   FERRITIN 29 (L) 11/26/2021   Attestation Statements:   Reviewed by clinician on day of visit: allergies, medications, problem list, medical history, surgical history, family history, social history, and previous encounter notes.   Alex [name], am acting as transcriptionist for Alex Nip,  MD.  I have reviewed the above documentation for accuracy and completeness, and I agree with the above. -  Alex Nip, MD

## 2022-04-03 ENCOUNTER — Other Ambulatory Visit (HOSPITAL_COMMUNITY): Payer: Self-pay

## 2022-04-07 DIAGNOSIS — F331 Major depressive disorder, recurrent, moderate: Secondary | ICD-10-CM | POA: Diagnosis not present

## 2022-04-07 DIAGNOSIS — F411 Generalized anxiety disorder: Secondary | ICD-10-CM | POA: Diagnosis not present

## 2022-04-07 IMAGING — CT CT CARDIAC CORONARY ARTERY CALCIUM SCORE
3 series · 14 of 20 positions shown, 16 images · non-contrast
Comparison: None.
COMPARISON: None.

Addendum:
EXAM:
OVER-READ INTERPRETATION  CT CHEST

The following report is an over-read performed by radiologist Dr.
Cintia Jaquelin Iraeta [REDACTED] on 06/16/2021. This
over-read does not include interpretation of cardiac or coronary
anatomy or pathology. The coronary calcium score interpretation by
the cardiologist is attached.
CLINICAL DATA: Cardiovascular Disease Risk stratification
Coronary Calcium Score
TECHNIQUE: A gated, non-contrast computed tomography scan of the heart was
performed using 3mm slice thickness. Axial images were analyzed on a
dedicated workstation. Calcium scoring of the coronary arteries was
performed using the Agatston method.

[Series 2: cascseq 2.0 sa36 70% (id) · axial · 0.46mm/px · z∈[-240,-160]mm · 4 of 68 slices shown]
[im 14/68  vessel]
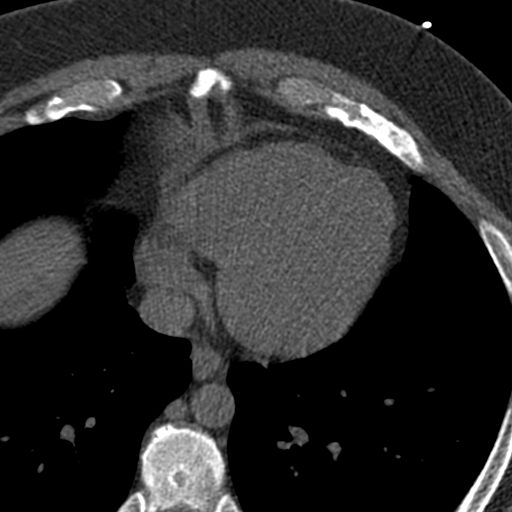
[im 27/68  vessel]
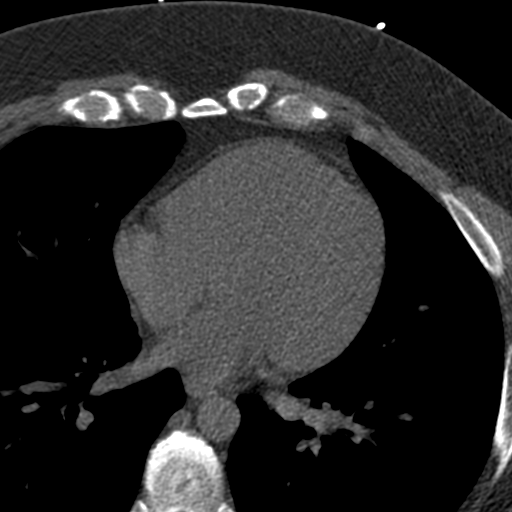
[im 41/68  vessel]
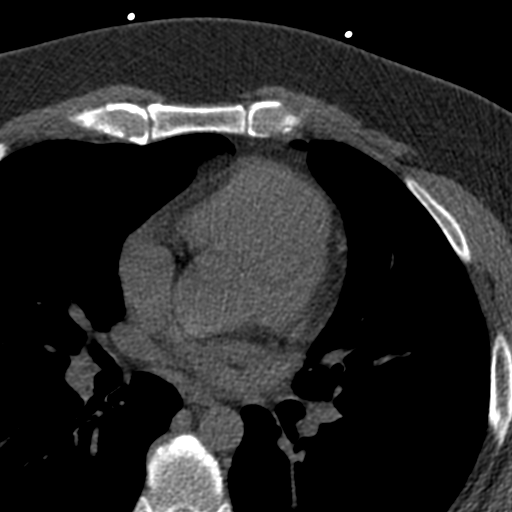
[im 54/68  vessel]
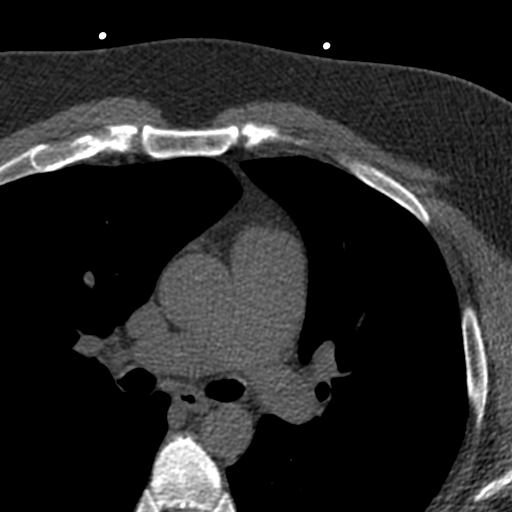

[Series 3: cascseq 2.0 bf37 st · axial · 0.73mm/px · z∈[-244,-156]mm · 5 of 68 slices shown, 7 images]
[im 12/68  vessel]
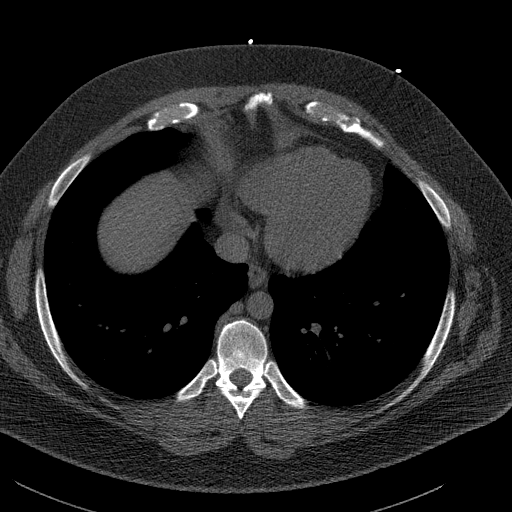
[im 12/68  lung]
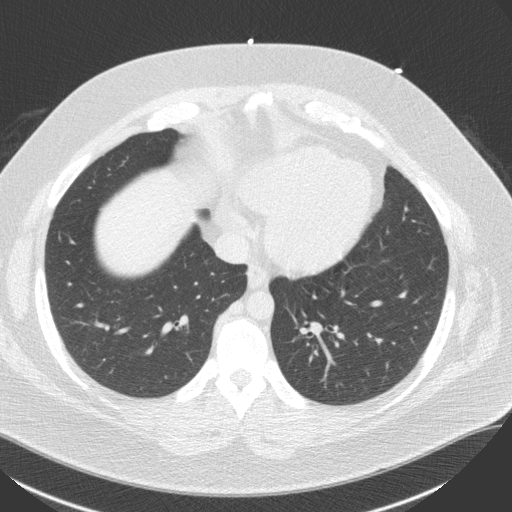
[im 23/68  vessel]
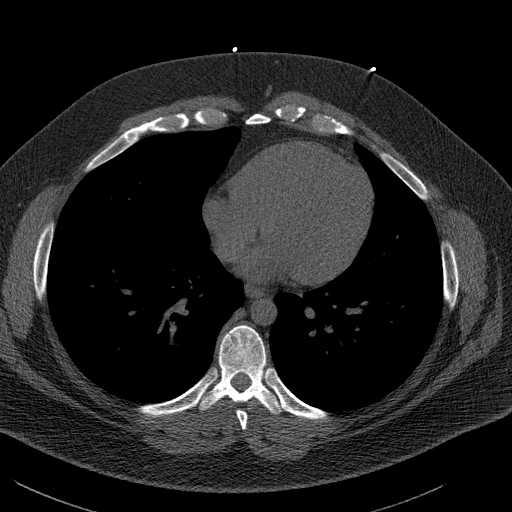
[im 34/68  vessel]
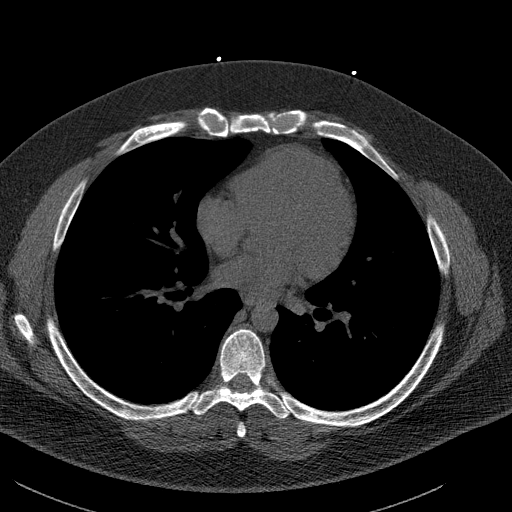
[im 45/68  vessel]
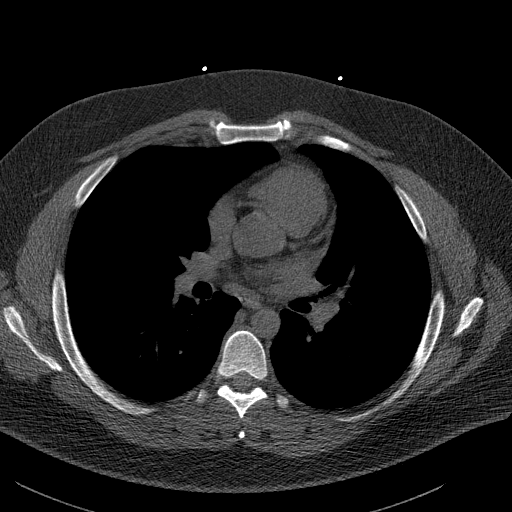
[im 56/68  vessel]
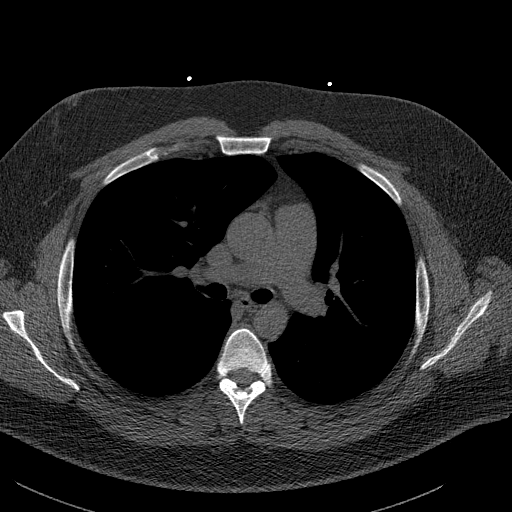
[im 56/68  lung]
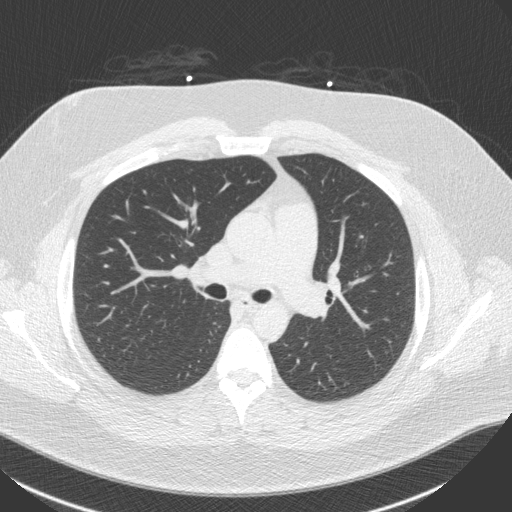

[Series 4: cascseq 2.0 br59 lung · axial · 0.73mm/px · z∈[-244,-156]mm · 5 of 68 slices shown]
[im 12/68  lung]
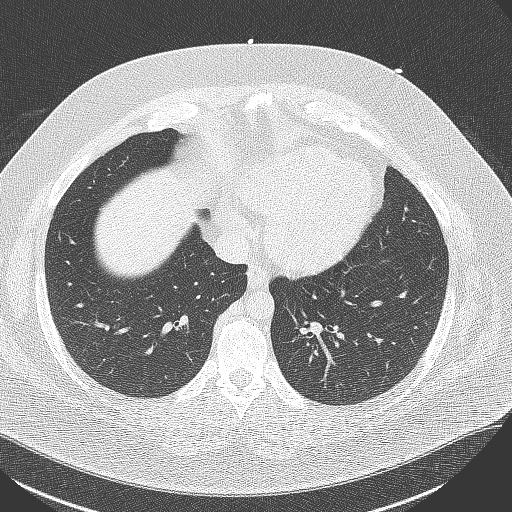
[im 23/68  lung]
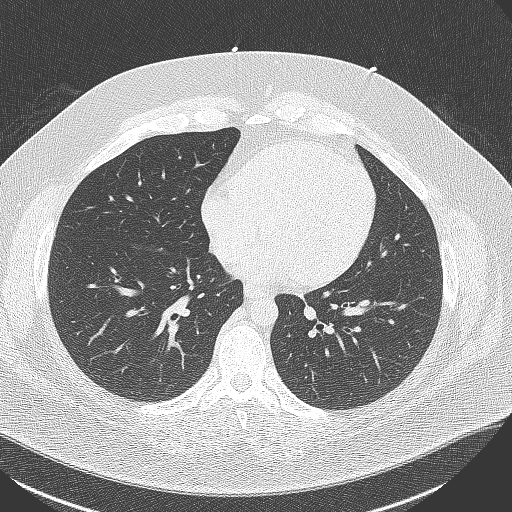
[im 34/68  lung]
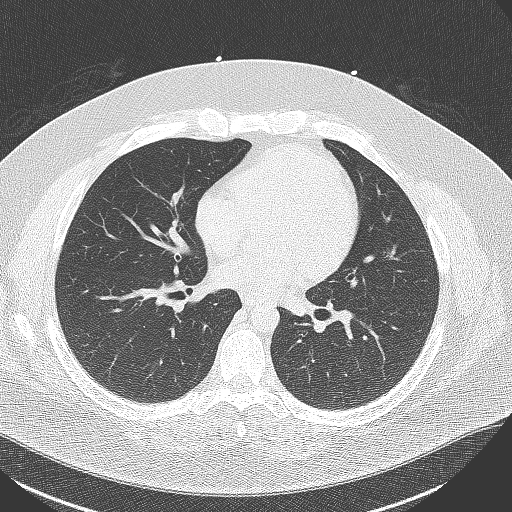
[im 45/68  lung]
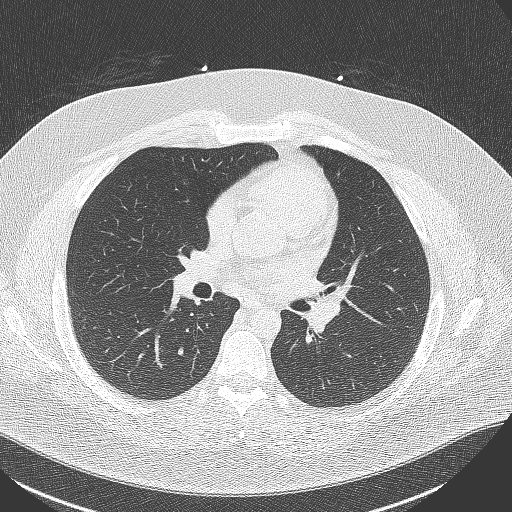
[im 56/68  lung]
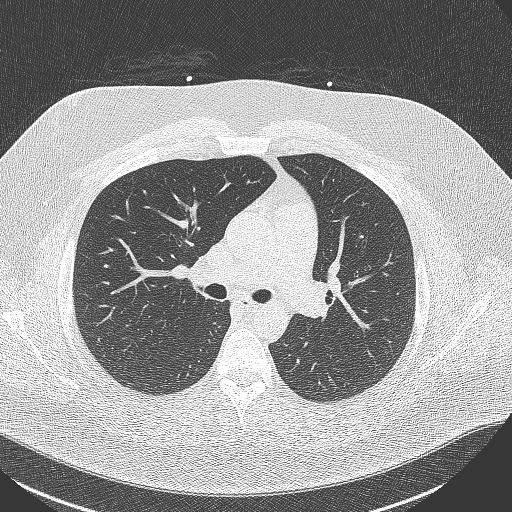

[14 of 20 positions shown; findings below may reference images not displayed]

FINDINGS: Vascular: No significant noncardiac vascular findings.

Mediastinum/Nodes: Visualized mediastinum and hilar regions
demonstrate no lymphadenopathy or masses.

Lungs/Pleura: Visualized lungs show no evidence of pulmonary edema,
consolidation, pneumothorax, nodule or pleural fluid.

Upper Abdomen: No acute abnormality.

Musculoskeletal: No chest wall mass or suspicious bone lesions
identified.
IMPRESSION: No significant incidental findings.
FINDINGS: Coronary arteries: Normal origins.

Coronary Calcium Score:

Left main: 0

Left anterior descending artery: 0

Left circumflex artery: 0

Right coronary artery: 0

Total: 0

Pericardium: Normal.

Ascending Aorta: Normal caliber.

Non-cardiac: See separate report from [REDACTED].
IMPRESSION: Coronary calcium score of 0 Agatston units. This suggests low risk
for future cardiac events.



If CAC=0, it is reasonable to withhold statin therapy and reassess
in 5 to 10 years, as long as higher risk conditions are absent
(diabetes mellitus, family history of premature CHD in first degree
relatives (males <55 years; females <65 years), cigarette smoking,
or LDL >=190 mg/dL).

If CAC is 1 to 99, it is reasonable to initiate statin therapy for
patients >=55 years of age.

If CAC is >=100 or >=75th percentile, it is reasonable to initiate
statin therapy at any age.

Cardiology referral should be considered for patients with CAC
scores >=400 or >=75th percentile.

*9806 AHA/ACC/AACVPR/AAPA/ABC/YIOKKAS/HABES/LUDI/Sabir Hussain/RAMIAR/GIANNI/ORLANDINI
Guideline on the Management of Blood Cholesterol: A Report of the
American College of Cardiology/American Heart Association Task Force
on Clinical Practice Guidelines. J Am Coll Cardiol.
7431;73(24):4802-4653.

*** End of Addendum ***
EXAM:
OVER-READ INTERPRETATION  CT CHEST

The following report is an over-read performed by radiologist Dr.
Cintia Jaquelin Iraeta [REDACTED] on 06/16/2021. This
over-read does not include interpretation of cardiac or coronary
anatomy or pathology. The coronary calcium score interpretation by
the cardiologist is attached.
FINDINGS: Vascular: No significant noncardiac vascular findings.

Mediastinum/Nodes: Visualized mediastinum and hilar regions
demonstrate no lymphadenopathy or masses.

Lungs/Pleura: Visualized lungs show no evidence of pulmonary edema,
consolidation, pneumothorax, nodule or pleural fluid.

Upper Abdomen: No acute abnormality.

Musculoskeletal: No chest wall mass or suspicious bone lesions
identified.
IMPRESSION: No significant incidental findings.

## 2022-04-08 ENCOUNTER — Ambulatory Visit (INDEPENDENT_AMBULATORY_CARE_PROVIDER_SITE_OTHER): Payer: 59 | Admitting: Family Medicine

## 2022-04-08 ENCOUNTER — Encounter (INDEPENDENT_AMBULATORY_CARE_PROVIDER_SITE_OTHER): Payer: Self-pay | Admitting: Family Medicine

## 2022-04-08 VITALS — BP 107/63 | HR 72 | Temp 98.0°F | Ht 71.0 in | Wt 298.0 lb

## 2022-04-08 DIAGNOSIS — E669 Obesity, unspecified: Secondary | ICD-10-CM

## 2022-04-08 DIAGNOSIS — E8881 Metabolic syndrome: Secondary | ICD-10-CM | POA: Diagnosis not present

## 2022-04-08 DIAGNOSIS — Z6841 Body Mass Index (BMI) 40.0 and over, adult: Secondary | ICD-10-CM

## 2022-04-08 DIAGNOSIS — E559 Vitamin D deficiency, unspecified: Secondary | ICD-10-CM

## 2022-04-08 DIAGNOSIS — E7849 Other hyperlipidemia: Secondary | ICD-10-CM | POA: Diagnosis not present

## 2022-04-08 NOTE — Progress Notes (Signed)
Chief Complaint:   OBESITY Alex [name] is here to discuss Alex [name] progress with [name] obesity treatment plan along with follow-up of [name] obesity related diagnoses. Damante is on the Category 4 Plan and states Alex [name] is following [name] eating plan approximately 50% of the time. [name] states Alex [name] is lifting weights and doing cardio for 45-60 minutes 3 times per week.  Today's visit was #: 34 Starting weight: 311 lbs Starting date: 06/25/2021 Today's weight: 298 lbs Today's date: 04/08/2022 Total lbs lost to date: 13 Total lbs lost since last in-office visit: 0  Interim History: Edoardo notes struggling more with cravings and he has been less diligent about meeting his journaling goals.  Exercise is still doing well.  Subjective:   1. Other hyperlipidemia Stetson's LDL continues to improve and is getting closer to goal with decreased cholesterol in his diet.  I discussed labs with the patient today.  2. Vitamin D deficiency Taison's vitamin D level is now at goal on prescription vitamin D.  I discussed labs with the patient today.  3. Insulin resistance [name]'s A1c and glucose are at goal, and his fasting insulin is stable but still slightly above goal.  He continues to do well with his diet and exercise.  I discussed labs with the patient today.  Assessment/Plan:   1. Other hyperlipidemia [name] will continue with his diet, exercise, and weight loss.  2. Vitamin D deficiency [name] will continue his vitamin D prescription as is.  3. Insulin resistance [name] will continue with his diet and exercise, and we will continue to monitor.  4. Obesity, Current BMI 41.6 [name] is currently in the action stage of change. As such, Alex [name] goal is to continue with weight loss efforts. Alex [name] has agreed to the Category 4 Plan or keeping a food journal and adhering to recommended goals of 1800  calories and 120 grams of protein daily.   Exercise goals: As is.  Behavioral modification strategies: increasing lean protein intake and meal planning and cooking strategies.  [name] has agreed to follow-up with our clinic in 3 weeks. [name] was informed of the importance of frequent follow-up visits to maximize [name] success with intensive lifestyle modifications for Alex [name] multiple health conditions.   Objective:   Blood pressure 107/63, pulse 72, temperature 98 F (36.7 C), height '5\' 11"'$  (1.803 m), weight 298 lb (135.2 kg), SpO2 97 %. Body mass index is 41.56 kg/m.  General: Cooperative, alert, well developed, in no acute distress. HEENT: Conjunctivae and lids unremarkable. Cardiovascular: Regular rhythm.  Lungs: Normal work of breathing. Neurologic: No focal deficits.   Lab Results  Component Value Date   CREATININE 0.93 03/17/2022   BUN 13 03/17/2022   NA 136 03/17/2022   K 4.5 03/17/2022   CL 99 03/17/2022   CO2 22 03/17/2022   Lab Results  Component Value Date   ALT 34 03/17/2022   AST 30 03/17/2022   ALKPHOS 78 03/17/2022   BILITOT 0.4 03/17/2022   Lab Results  Component Value Date   HGBA1C 4.9 03/17/2022   HGBA1C 5.0 11/26/2021   HGBA1C 5.0 06/25/2021   Lab Results  Component Value Date   INSULIN 10.0 03/17/2022   INSULIN 10.1 11/26/2021   INSULIN 6.5 06/25/2021   Lab Results  Component Value Date   TSH 2.910 11/26/2021   Lab Results  Component Value Date   CHOL  167 03/17/2022   HDL 54 03/17/2022   LDLCALC 101 (H) 03/17/2022   TRIG 64 03/17/2022   CHOLHDL 3.2 03/07/2019   Lab Results  Component Value Date   VD25OH 59.7 03/17/2022   VD25OH 49.8 11/26/2021   VD25OH 33.0 06/25/2021   Lab Results  Component Value Date   WBC 6.4 03/17/2022   HGB 15.3 03/17/2022   HCT 45.7 03/17/2022   MCV 88 03/17/2022   PLT 200 03/17/2022   Lab Results  Component Value Date   IRON 61 11/26/2021   TIBC 373 11/26/2021    FERRITIN 29 (L) 11/26/2021   Attestation Statements:   Reviewed by clinician on day of visit: allergies, medications, problem list, medical history, surgical history, family history, social history, and previous encounter notes.  Time spent on visit including pre-visit chart review and post-visit care and charting was 30 minutes.   I, Trixie Dredge, am acting as transcriptionist for [name], MD.  I have reviewed the above documentation for accuracy and completeness, and I agree with the above. -  [name], MD

## 2022-04-29 ENCOUNTER — Ambulatory Visit (INDEPENDENT_AMBULATORY_CARE_PROVIDER_SITE_OTHER): Payer: 59 | Admitting: Family Medicine

## 2022-04-29 ENCOUNTER — Encounter (INDEPENDENT_AMBULATORY_CARE_PROVIDER_SITE_OTHER): Payer: Self-pay | Admitting: Family Medicine

## 2022-04-29 VITALS — BP 101/68 | HR 57 | Temp 97.9°F | Ht 71.0 in | Wt 299.6 lb

## 2022-04-29 DIAGNOSIS — F3289 Other specified depressive episodes: Secondary | ICD-10-CM | POA: Diagnosis not present

## 2022-04-29 DIAGNOSIS — E7849 Other hyperlipidemia: Secondary | ICD-10-CM | POA: Diagnosis not present

## 2022-04-29 DIAGNOSIS — E66813 Obesity, class 3: Secondary | ICD-10-CM

## 2022-04-29 DIAGNOSIS — E669 Obesity, unspecified: Secondary | ICD-10-CM

## 2022-04-29 DIAGNOSIS — E559 Vitamin D deficiency, unspecified: Secondary | ICD-10-CM | POA: Diagnosis not present

## 2022-04-29 DIAGNOSIS — Z6841 Body Mass Index (BMI) 40.0 and over, adult: Secondary | ICD-10-CM

## 2022-04-30 NOTE — Progress Notes (Signed)
Chief Complaint:   OBESITY [name] is here to discuss COURTLAND REAS progress with [name] obesity treatment plan along with follow-up of JYAIRE [name] obesity related diagnoses. [name] is on the Category 4 Plan or keeping a food journal and adhering to recommended goals of 1800 calories and 120 grams of protein daily and states [name] is following [name] eating plan approximately 40% of the time. [place] states [name] is lifting weights and doing cardio for 45 minutes 3 times per week.  Today's visit was #: 61 Starting weight: 311 lbs Starting date: 06/25/2021 Today's weight: 299 lbs Today's date: 04/29/2022 Total lbs lost to date: 12 Total lbs lost since last in-office visit: 0  Interim History: Alex [name] is working on meal planning, and bringing healthy snacks to work.  His wife is supportive.  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  He is doing spinning or walking 1 time per week and weight training 2 times per week.  Emotional eating has improved.  He is traveling to [place] next week.  He is getting 130 g of protein per day.  Subjective:   1. Vitamin D deficiency Alex [name] is on prescription vitamin D weekly.  His last vitamin D level was 59 on 03/17/2022.  2. Other hyperlipidemia Alex [name]'s FLP was updated on 03/17/2022.  3.  Other Specified Depressive Disorder, Emotional Eating Behaviors Alex [name] is status post cognitive behavioral therapy with Dr. [name].  He has a good support system at home.  He is in school and working, which will be improving soon.  Assessment/Plan:   1. Vitamin D deficiency Alex [name] will continue prescription vitamin D 50,000 units once weekly.  2. Other hyperlipidemia Alex [name] will continue his Mediterranean diet, regular exercise, and weight management.  3.  Other Specified Depressive Disorder, Emotional Eating Behaviors Alex [name] will continue to work on mindful eating, meal planning,  and stress reduction.  4. Obesity, Current BMI 41.8 Alex [name] is currently in the action stage of change. As such, [name] goal is to continue with weight loss efforts. Alex [name] has agreed to the Category 4 Plan.   Alex [name] will continue to work on his category for eating plan and gym workouts 3 times per week.  Exercise goals: As is.   Behavioral modification strategies: increasing water intake, travel eating strategies, and planning for success.  Alex [name] has agreed to follow-up with our clinic in 3 weeks. Alex [name] was informed of the importance of frequent follow-up visits to maximize Alex [name] success with intensive lifestyle modifications for Alex [name] multiple health conditions.   Objective:   Blood pressure 101/68, pulse (!) 57, temperature 97.9 F (36.6 C), height '5\' 11"'$  (1.803 m), weight 299 lb 9.6 oz (135.9 kg), SpO2 99 %. Body mass index is 41.79 kg/m.  General: Cooperative, alert, well developed, in no acute distress. HEENT: Conjunctivae and lids unremarkable. Cardiovascular: Regular rhythm.  Lungs: Normal work of breathing. Neurologic: No focal deficits.   Lab Results  Component Value Date   CREATININE 0.93 03/17/2022   BUN 13 03/17/2022   NA 136 03/17/2022   K 4.5 03/17/2022   CL 99 03/17/2022   CO2 22 03/17/2022   Lab Results  Component Value Date   ALT 34 03/17/2022   AST 30 03/17/2022   ALKPHOS 78 03/17/2022   BILITOT 0.4 03/17/2022   Lab Results  Component Value Date   HGBA1C 4.9 03/17/2022  HGBA1C 5.0 11/26/2021   HGBA1C 5.0 06/25/2021   Lab Results  Component Value Date   INSULIN 10.0 03/17/2022   INSULIN 10.1 11/26/2021   INSULIN 6.5 06/25/2021   Lab Results  Component Value Date   TSH 2.910 11/26/2021   Lab Results  Component Value Date   CHOL 167 03/17/2022   HDL 54 03/17/2022   LDLCALC 101 (H) 03/17/2022   TRIG 64 03/17/2022   CHOLHDL 3.2 03/07/2019   Lab Results  Component Value Date    VD25OH 59.7 03/17/2022   VD25OH 49.8 11/26/2021   VD25OH 33.0 06/25/2021   Lab Results  Component Value Date   WBC 6.4 03/17/2022   HGB 15.3 03/17/2022   HCT 45.7 03/17/2022   MCV 88 03/17/2022   PLT 200 03/17/2022   Lab Results  Component Value Date   IRON 61 11/26/2021   TIBC 373 11/26/2021   FERRITIN 29 (L) 11/26/2021   Attestation Statements:   Reviewed by clinician on day of visit: allergies, medications, problem list, medical history, surgical history, family history, social history, and previous encounter notes.  Time spent on visit including pre-visit chart review and post-visit care and charting was 30 minutes.   I, Trixie Dredge, am acting as transcriptionist for [name], MD.  I have reviewed the above documentation for accuracy and completeness, and I agree with the above. -  [name], MD

## 2022-05-01 ENCOUNTER — Other Ambulatory Visit (HOSPITAL_COMMUNITY): Payer: Self-pay

## 2022-05-14 ENCOUNTER — Other Ambulatory Visit (HOSPITAL_COMMUNITY): Payer: Self-pay

## 2022-05-14 DIAGNOSIS — F331 Major depressive disorder, recurrent, moderate: Secondary | ICD-10-CM | POA: Diagnosis not present

## 2022-05-14 DIAGNOSIS — F411 Generalized anxiety disorder: Secondary | ICD-10-CM | POA: Diagnosis not present

## 2022-05-14 MED ORDER — BUSPIRONE HCL 15 MG PO TABS
15.0000 mg | ORAL_TABLET | Freq: Three times a day (TID) | ORAL | 0 refills | Status: DC
  Filled 2022-05-14 – 2022-07-31 (×2): qty 270, 90d supply, fill #0

## 2022-05-14 MED ORDER — DULOXETINE HCL 30 MG PO CPEP
30.0000 mg | ORAL_CAPSULE | Freq: Every day | ORAL | 0 refills | Status: DC
  Filled 2022-05-14 – 2022-06-03 (×2): qty 90, 90d supply, fill #0

## 2022-05-19 DIAGNOSIS — F411 Generalized anxiety disorder: Secondary | ICD-10-CM | POA: Diagnosis not present

## 2022-05-19 DIAGNOSIS — F331 Major depressive disorder, recurrent, moderate: Secondary | ICD-10-CM | POA: Diagnosis not present

## 2022-05-20 ENCOUNTER — Other Ambulatory Visit (HOSPITAL_COMMUNITY): Payer: Self-pay

## 2022-05-20 ENCOUNTER — Encounter (INDEPENDENT_AMBULATORY_CARE_PROVIDER_SITE_OTHER): Payer: Self-pay | Admitting: Family Medicine

## 2022-05-20 ENCOUNTER — Ambulatory Visit (INDEPENDENT_AMBULATORY_CARE_PROVIDER_SITE_OTHER): Payer: 59 | Admitting: Family Medicine

## 2022-05-20 VITALS — BP 123/77 | HR 65 | Temp 98.0°F | Ht 71.0 in | Wt 306.0 lb

## 2022-05-20 DIAGNOSIS — E669 Obesity, unspecified: Secondary | ICD-10-CM

## 2022-05-20 DIAGNOSIS — Z6841 Body Mass Index (BMI) 40.0 and over, adult: Secondary | ICD-10-CM | POA: Diagnosis not present

## 2022-05-20 DIAGNOSIS — E559 Vitamin D deficiency, unspecified: Secondary | ICD-10-CM

## 2022-05-20 MED ORDER — VITAMIN D (ERGOCALCIFEROL) 1.25 MG (50000 UNIT) PO CAPS
50000.0000 [IU] | ORAL_CAPSULE | ORAL | 0 refills | Status: DC
  Filled 2022-05-20 – 2022-06-03 (×2): qty 4, 28d supply, fill #0

## 2022-05-25 ENCOUNTER — Encounter: Payer: Self-pay | Admitting: Family Medicine

## 2022-05-27 NOTE — Progress Notes (Unsigned)
Chief Complaint:   OBESITY Alex [name] is here to discuss Alex [name] progress with Alex [name] obesity treatment plan along with follow-up of Alex [name] obesity related diagnoses. Alex [name] is on the Category 4 Plan and states Alex [name] is following Alex [name] eating plan approximately 20% of the time. Dima states Alex [name] is doing 0 minutes 0 times per week.  Today's visit was #: 50 Starting weight: 311 lbs Starting date: 06/25/2021 Today's weight: 306 lbs Today's date: 05/20/2022 Total lbs lost to date: 5 Total lbs lost since last in-office visit: 0  Interim History: Alex [name] recently returned from a vacation in [place], where he enjoyed himself and did some celebration eating.  He is ready to get back on track with his weight loss plan.  Subjective:   1. Vitamin D deficiency Alex [name] is stable on prescription vitamin D.  Assessment/Plan:   1. Vitamin D deficiency We will refill prescription Vitamin D for 1 month. Alex [name] will follow-up for routine testing of Vitamin D, at least 2-3 times per year to avoid over-replacement.  - Vitamin D, Ergocalciferol, (DRISDOL) 1.25 MG (50000 UNIT) CAPS capsule; Take 1 capsule (50,000 Units total) by mouth every 7 (seven) days.  Dispense: 4 capsule; Refill: 0  2. Obesity, Current BMI 42.7 Alex [name] is currently in the action stage of change. As such, Alex [name] goal is to continue with weight loss efforts. Alex [name] has agreed to the Category 4 Plan and keeping a food journal and adhering to recommended goals of 450-650 calories and 45+ grams of protein at supper daily.   Behavioral modification strategies: increasing lean protein intake.  Alex [name] has agreed to follow-up with our clinic in 3 weeks. Alex [name] was informed of the importance of frequent follow-up visits to maximize Alex [name] success with intensive lifestyle modifications for Alex [name] multiple health  conditions.   Objective:   Blood pressure 123/77, pulse 65, temperature 98 F (36.7 C), height '5\' 11"'$  (1.803 m), weight (!) 306 lb (138.8 kg), SpO2 97 %. Body mass index is 42.68 kg/m.  General: Cooperative, alert, well developed, in no acute distress. HEENT: Conjunctivae and lids unremarkable. Cardiovascular: Regular rhythm.  Lungs: Normal work of breathing. Neurologic: No focal deficits.   Lab Results  Component Value Date   CREATININE 0.93 03/17/2022   BUN 13 03/17/2022   NA 136 03/17/2022   K 4.5 03/17/2022   CL 99 03/17/2022   CO2 22 03/17/2022   Lab Results  Component Value Date   ALT 34 03/17/2022   AST 30 03/17/2022   ALKPHOS 78 03/17/2022   BILITOT 0.4 03/17/2022   Lab Results  Component Value Date   HGBA1C 4.9 03/17/2022   HGBA1C 5.0 11/26/2021   HGBA1C 5.0 06/25/2021   Lab Results  Component Value Date   INSULIN 10.0 03/17/2022   INSULIN 10.1 11/26/2021   INSULIN 6.5 06/25/2021   Lab Results  Component Value Date   TSH 2.910 11/26/2021   Lab Results  Component Value Date   CHOL 167 03/17/2022   HDL 54 03/17/2022   LDLCALC 101 (H) 03/17/2022   TRIG 64 03/17/2022   CHOLHDL 3.2 03/07/2019   Lab Results  Component Value Date   VD25OH 59.7 03/17/2022   VD25OH 49.8 11/26/2021   VD25OH 33.0 06/25/2021   Lab Results  Component Value Date   WBC 6.4 03/17/2022   HGB 15.3 03/17/2022   HCT 45.7 03/17/2022   MCV  88 03/17/2022   PLT 200 03/17/2022   Lab Results  Component Value Date   IRON 61 11/26/2021   TIBC 373 11/26/2021   FERRITIN 29 (L) 11/26/2021   Attestation Statements:   Reviewed by clinician on day of visit: allergies, medications, problem list, medical history, surgical history, family history, social history, and previous encounter notes.  Time spent on visit including pre-visit chart review and post-visit care and charting was 41 minutes.   I, Trixie Dredge, am acting as transcriptionist for [name], MD.  I have  reviewed the above documentation for accuracy and completeness, and I agree with the above. -  [name], MD

## 2022-05-28 ENCOUNTER — Other Ambulatory Visit (HOSPITAL_COMMUNITY): Payer: Self-pay

## 2022-05-29 ENCOUNTER — Ambulatory Visit: Payer: 59 | Admitting: Physician Assistant

## 2022-05-29 ENCOUNTER — Encounter: Payer: Self-pay | Admitting: Physician Assistant

## 2022-05-29 VITALS — BP 132/86 | HR 77 | Temp 98.2°F | Ht 71.0 in | Wt 308.0 lb

## 2022-05-29 DIAGNOSIS — M79672 Pain in left foot: Secondary | ICD-10-CM

## 2022-05-29 DIAGNOSIS — Z0000 Encounter for general adult medical examination without abnormal findings: Secondary | ICD-10-CM

## 2022-05-29 NOTE — Patient Instructions (Addendum)
Welcome to [facility]! It was a pleasure meeting you today.  Sports med referral for possible Morton's Neuroma  As discussed, Please schedule a 12 month follow up visit today.  PLEASE NOTE:  If you had any LAB tests please let us know if you have not heard back within a few days. You may see your results on MyChart before we have a chance to review them but we will give you a call once they are reviewed by [place]. If we ordered any REFERRALS today, please let us know if you have not heard from their office within the next two weeks. Let us know through MyChart if you are needing REFILLS, or have your pharmacy send [place] the request. You can also use MyChart to communicate with me or any office staff.  Please try these tips to maintain a healthy lifestyle:  Eat most of your calories during the day when you are active. Eliminate processed foods including packaged sweets (pies, cakes, cookies), reduce intake of potatoes, white bread, white pasta, and white rice. Look for whole grain options, oat flour or almond flour.  Each meal should contain half fruits/vegetables, one quarter protein, and one quarter carbs (no bigger than a computer mouse).  Cut down on sweet beverages. This includes juice, soda, and sweet tea. Also watch fruit intake, though this is a healthier sweet option, it still contains natural sugar! Limit to 3 servings daily.  Drink at least 1 glass of water with each meal and aim for at least 8 glasses (64 ounces) per day.  Exercise at least 150 minutes every week to the best of your ability.    Take Care,  Lachanda Buczek, PA-C

## 2022-05-29 NOTE — Progress Notes (Signed)
Subjective:    Patient ID: Alex [name], adult    DOB: [birth year], 41 y.o.   MRN: [id number]  Chief Complaint  Patient presents with   Establish Care    Pt here to establish care, pt is having an achy feeling when putting pressure on ball of left foot has been present for 1 mon; otherwise all is well and no concerns, pt is due for annual CPE, however pt at [facility] and does fasting labs several times a year, last drawn 03/17/22.     HPI 41 y.o. patient presents today for new patient establishment with me.  Patient was previously established with Dr. Ronnald Ramp.  Current Care Team: [facility] - post-COVID, some exercise induced asthma; Flovent daily and albuterol prn Vein and Vascular - some venous insufficiency left leg; no pain or issues  [facility] - psych and counseling   Acute concerns: Left foot pain x 1 month - dull ache joint space between 2nd and third toes, comes and goes, worse with pressure on it   Health maintenance: Lifestyle/ exercise: Healthy [employer]; working on nutrition and exercise - trying to get back consistent Mental health: Previously on Wellbutrin for 10+ years; switched to Cymbalta, doing good so far; Buspar helps with ruminating thoughts; hydroxyzine only rarely if needed for anxiety Caffeine: Coffee daily  Sleep: Going OK; sleep study in ?2015 - negative Substance use: None ETOH: 1-2 drinks per month  Sexual activity: Monogamous Immunizations: UTD  Colonoscopy: Cancer screening at age 89, low-risk   Past Medical History:  Diagnosis Date   Anxiety    Arterial embolism of left leg (HCC)    Asthma    exercise-induced   Constipation    Depression    Infertility male    Palpitation    SOB (shortness of breath)    Vitamin D deficiency     Past Surgical History:  Procedure Laterality Date   COLONOSCOPY  2017   bleeding - negative scope   WISDOM TOOTH EXTRACTION      Family History  Problem Relation Age of  Onset   Anxiety disorder Mother    Depression Mother    Obesity Mother    Obesity Father    Hyperlipidemia Father    Hypertension Father    Depression Father    Anxiety disorder Father    Asthma Father    Cancer Maternal Grandfather        skin & lung   Emphysema Maternal Grandfather    Cancer Paternal Grandfather        leukemia    Social History   Tobacco Use   Smoking status: Never   Smokeless tobacco: Never  Vaping Use   Vaping Use: Never used  Substance Use Topics   Alcohol use: Not Currently    Comment: occasional   Drug use: No     No Known Allergies  Review of Systems NEGATIVE UNLESS OTHERWISE INDICATED IN HPI      Objective:     BP 132/86 (BP Location: Right Arm)   Pulse 77   Temp 98.2 F (36.8 C) (Temporal)   Ht '5\' 11"'$  (1.803 m)   Wt (!) 308 lb (139.7 kg)   SpO2 98%   BMI 42.96 kg/m   Wt Readings from Last 3 Encounters:  05/29/22 (!) 308 lb (139.7 kg)  05/20/22 (!) 306 lb (138.8 kg)  04/29/22 299 lb 9.6 oz (135.9 kg)    BP Readings from Last 3 Encounters:  05/29/22  132/86  05/20/22 123/77  04/29/22 101/68     Physical Exam Vitals and nursing note reviewed.  Constitutional:      General: Alex [name] is not in acute distress.    Appearance: Normal appearance. Alex [name] is [name]. Alex [name] is not toxic-appearing.  HENT:     Head: Normocephalic and atraumatic.     Right Ear: Tympanic membrane, ear canal and external ear normal.     Left Ear: Tympanic membrane, ear canal and external ear normal.     Nose: Nose normal.     Mouth/Throat:     Mouth: Mucous membranes are moist.     Pharynx: Oropharynx is clear.  Eyes:     Extraocular Movements: Extraocular movements intact.     Conjunctiva/sclera: Conjunctivae normal.     Pupils: Pupils are equal, round, and reactive to light.  Cardiovascular:     Rate and Rhythm: Normal rate and regular rhythm.     Pulses: Normal pulses.     Heart sounds: Normal heart sounds.   Pulmonary:     Effort: Pulmonary effort is normal.     Breath sounds: Normal breath sounds.  Abdominal:     General: Abdomen is flat. Bowel sounds are normal.     Palpations: Abdomen is soft.     Tenderness: There is no abdominal tenderness.  Musculoskeletal:        General: Normal range of motion.     Cervical back: Normal range of motion and neck supple.     Right lower leg: No edema.     Left lower leg: No edema.     Comments: No abnormalities of feet noted  Skin:    General: Skin is warm and dry.  Neurological:     General: No focal deficit present.     Mental Status: Alex [name] is alert and oriented to person, place, and time.  Psychiatric:        Mood and Affect: Mood normal.        Behavior: Behavior normal.        Assessment & Plan:   Problem List Items Addressed This Visit   None Visit Diagnoses     Encounter for annual physical exam    -  Primary   Acute foot pain, left       Relevant Orders   Ambulatory referral to Sports Medicine       Plan: Age-appropriate screening and counseling performed today. Will check labs and call with results. Preventive measures discussed and printed in AVS for patient.  -Encouraged to keep up good work with [facility] -Regular f/up with specialists -Refer to sports medicine for left foot pain  Patient Counseling: '[x]'$   Nutrition: Stressed importance of moderation in sodium/caffeine intake, saturated fat and cholesterol, caloric balance, sufficient intake of fresh fruits, vegetables, and fiber.  '[x]'$   Stressed the importance of regular exercise.   '[x]'$   Substance Abuse: Discussed cessation/primary prevention of tobacco, alcohol, or other drug use; driving or other dangerous activities under the influence; availability of treatment for abuse.   '[]'$   Injury prevention: Discussed safety belts, safety helmets, smoke detector, smoking near bedding or upholstery.   '[]'$   Sexuality: Discussed sexually transmitted diseases, partner  selection, use of condoms, avoidance of unintended pregnancy  and contraceptive alternatives.   '[x]'$   Dental health: Discussed importance of regular tooth brushing, flossing, and dental visits.  '[x]'$   Health maintenance and immunizations reviewed. Please refer to Health maintenance section.  Return in about 1 year (around 05/30/2023) for fasting labs, CPE.   Galina Haddox M Damare Serano, PA-C

## 2022-06-03 ENCOUNTER — Encounter (INDEPENDENT_AMBULATORY_CARE_PROVIDER_SITE_OTHER): Payer: Self-pay

## 2022-06-03 ENCOUNTER — Other Ambulatory Visit (HOSPITAL_COMMUNITY): Payer: Self-pay

## 2022-06-04 ENCOUNTER — Encounter: Payer: Self-pay | Admitting: Family Medicine

## 2022-06-05 ENCOUNTER — Encounter: Payer: Self-pay | Admitting: Family Medicine

## 2022-06-09 NOTE — Progress Notes (Unsigned)
   I, Peterson Lombard, LAT, ATC acting as a scribe for Lynne Leader, MD.  Subjective:    CC: L foot pain  HPI: Pt is a 41 y/o adult c/o L foot pain ongoing for about 1 month. Pt locates pain to the heads of the 2nd-3rd MT on the L foot. 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  L foot swelling: no Aggravates: standing, walking, being on feet for long periods Treatments tried: compression socks, getting off feet, elevation  Pertinent review of Systems: No fevers or chills  Relevant historical information: Hypertension.  History of depression.   Objective:    Vitals:   06/10/22 1026  BP: 116/78  Pulse: 70  SpO2: 98%   General: Well Developed, well nourished, and in no acute distress.   MSK: Left foot: Normal-appearing.  No significant swelling.  Tender palpation plantar second metatarsal head.  Dorsal metatarsal shaft is nontender. Negative metatarsal squeeze test. Normal foot and ankle motion. Strength is intact. Pulses capillary refill and sensation are intact distally.     Impression and Recommendations:    Assessment and Plan: 41 y.o. adult with chronic left foot pain thought to be metatarsalgia.  Plan for metatarsal pads.  Recheck in about a month especially if not improving.  Return sooner if needed..    Discussed warning signs or symptoms. Please see discharge instructions. Patient expresses understanding.   The above documentation has been reviewed and is accurate and complete Lynne Leader, M.D.

## 2022-06-10 ENCOUNTER — Ambulatory Visit (INDEPENDENT_AMBULATORY_CARE_PROVIDER_SITE_OTHER): Payer: 59 | Admitting: Family Medicine

## 2022-06-10 ENCOUNTER — Encounter (INDEPENDENT_AMBULATORY_CARE_PROVIDER_SITE_OTHER): Payer: Self-pay | Admitting: Family Medicine

## 2022-06-10 VITALS — BP 116/78 | HR 70 | Ht 71.0 in | Wt 312.4 lb

## 2022-06-10 VITALS — BP 109/68 | HR 69 | Temp 98.1°F | Ht 71.0 in | Wt 308.0 lb

## 2022-06-10 DIAGNOSIS — E8881 Metabolic syndrome: Secondary | ICD-10-CM | POA: Diagnosis not present

## 2022-06-10 DIAGNOSIS — E669 Obesity, unspecified: Secondary | ICD-10-CM

## 2022-06-10 DIAGNOSIS — Z6841 Body Mass Index (BMI) 40.0 and over, adult: Secondary | ICD-10-CM

## 2022-06-10 DIAGNOSIS — M7742 Metatarsalgia, left foot: Secondary | ICD-10-CM

## 2022-06-10 NOTE — Patient Instructions (Addendum)
Thank you for coming in today.   I think you have metatarsalgia.   Use metatarsal pads or insoles in your shoes.   If not improving recheck in about 1 month.   Fleet feet can help you out. Bring you shoes to them.

## 2022-06-16 DIAGNOSIS — F331 Major depressive disorder, recurrent, moderate: Secondary | ICD-10-CM | POA: Diagnosis not present

## 2022-06-16 DIAGNOSIS — F411 Generalized anxiety disorder: Secondary | ICD-10-CM | POA: Diagnosis not present

## 2022-06-17 NOTE — Progress Notes (Signed)
Chief Complaint:   OBESITY Pookela is here to discuss IDREES QUAM progress with [name] obesity treatment plan along with follow-up of MERL BOMMARITO obesity related diagnoses. Bellamy is on the Category 4 Plan and keeping a food journal and adhering to recommended goals of 450-650 calories and 45+ grams of protein at supper daily and states ABEER IVERSEN is following DONTRELL STUCK eating plan approximately 50% of the time. Holman states NICHALOS BRENTON is doing 0 minutes 0 times per week.  Today's visit was #: 78 Starting weight: 311 lbs Starting date: 06/25/2021 Today's weight: 308 lbs Today's date: 06/10/2022 Total lbs lost to date: 3 Total lbs lost since last in-office visit: 0  Interim History: Sanel has been struggling with some work temptations and he has fallen into some all or nothing eating behaviors. He notes some increased financial stress.   Subjective:   1. Insulin resistance Behr continues to work on his diet and weight loss, but he notes some increased work temptations.   Assessment/Plan:   1. Insulin resistance We discussed strategies to help decrease work temptations, and increase protein and we will continue to follow.   2. Obesity, Current BMI 43.1 Abishai is currently in the action stage of change. As such, CALEL PISARSKI goal is to continue with weight loss efforts. KO BARDON has agreed to the Category 3 Plan.   Behavioral modification strategies: increasing lean protein intake, decreasing simple carbohydrates, and dealing with family or coworker sabotage.  Brecken has agreed to follow-up with our clinic in 4 weeks. KENTRAVIOUS LIPFORD was informed of the importance of frequent follow-up visits to maximize RYER ASATO success with intensive lifestyle modifications for GWYN MEHRING multiple health conditions.   Objective:   Blood pressure 109/68, pulse 69, temperature 98.1 F (36.7 C), height '5\' 11"'$  (1.803  m), weight (!) 308 lb (139.7 kg), SpO2 97 %. Body mass index is 42.96 kg/m.  General: Cooperative, alert, well developed, in no acute distress. HEENT: Conjunctivae and lids unremarkable. Cardiovascular: Regular rhythm.  Lungs: Normal work of breathing. Neurologic: No focal deficits.   Lab Results  Component Value Date   CREATININE 0.93 03/17/2022   BUN 13 03/17/2022   NA 136 03/17/2022   K 4.5 03/17/2022   CL 99 03/17/2022   CO2 22 03/17/2022   Lab Results  Component Value Date   ALT 34 03/17/2022   AST 30 03/17/2022   ALKPHOS 78 03/17/2022   BILITOT 0.4 03/17/2022   Lab Results  Component Value Date   HGBA1C 4.9 03/17/2022   HGBA1C 5.0 11/26/2021   HGBA1C 5.0 06/25/2021   Lab Results  Component Value Date   INSULIN 10.0 03/17/2022   INSULIN 10.1 11/26/2021   INSULIN 6.5 06/25/2021   Lab Results  Component Value Date   TSH 2.910 11/26/2021   Lab Results  Component Value Date   CHOL 167 03/17/2022   HDL 54 03/17/2022   LDLCALC 101 (H) 03/17/2022   TRIG 64 03/17/2022   CHOLHDL 3.2 03/07/2019   Lab Results  Component Value Date   VD25OH 59.7 03/17/2022   VD25OH 49.8 11/26/2021   VD25OH 33.0 06/25/2021   Lab Results  Component Value Date   WBC 6.4 03/17/2022   HGB 15.3 03/17/2022   HCT 45.7 03/17/2022   MCV 88 03/17/2022   PLT 200 03/17/2022   Lab Results  Component Value Date   IRON 61 11/26/2021   TIBC 373 11/26/2021  FERRITIN 29 (L) 11/26/2021   Attestation Statements:   Reviewed by clinician on day of visit: allergies, medications, problem list, medical history, surgical history, family history, social history, and previous encounter notes.  Time spent on visit including pre-visit chart review and post-visit care and charting was 30 minutes.   I, Trixie Dredge, am acting as transcriptionist for [name], MD.  I have reviewed the above documentation for accuracy and completeness, and I agree with the above. -  [name], MD

## 2022-06-18 ENCOUNTER — Encounter: Payer: Self-pay | Admitting: Family Medicine

## 2022-06-19 ENCOUNTER — Encounter: Payer: 59 | Admitting: Family Medicine

## 2022-07-02 ENCOUNTER — Encounter (INDEPENDENT_AMBULATORY_CARE_PROVIDER_SITE_OTHER): Payer: Self-pay | Admitting: Family Medicine

## 2022-07-02 ENCOUNTER — Other Ambulatory Visit (HOSPITAL_COMMUNITY): Payer: Self-pay

## 2022-07-02 ENCOUNTER — Ambulatory Visit (INDEPENDENT_AMBULATORY_CARE_PROVIDER_SITE_OTHER): Payer: 59 | Admitting: Family Medicine

## 2022-07-02 VITALS — BP 111/71 | HR 82 | Temp 98.3°F | Ht 71.0 in | Wt 310.0 lb

## 2022-07-02 DIAGNOSIS — Z6841 Body Mass Index (BMI) 40.0 and over, adult: Secondary | ICD-10-CM

## 2022-07-02 DIAGNOSIS — F418 Other specified anxiety disorders: Secondary | ICD-10-CM

## 2022-07-02 DIAGNOSIS — E559 Vitamin D deficiency, unspecified: Secondary | ICD-10-CM | POA: Diagnosis not present

## 2022-07-02 DIAGNOSIS — F32A Depression, unspecified: Secondary | ICD-10-CM | POA: Insufficient documentation

## 2022-07-02 DIAGNOSIS — E669 Obesity, unspecified: Secondary | ICD-10-CM

## 2022-07-02 DIAGNOSIS — F419 Anxiety disorder, unspecified: Secondary | ICD-10-CM | POA: Insufficient documentation

## 2022-07-02 MED ORDER — VITAMIN D (ERGOCALCIFEROL) 1.25 MG (50000 UNIT) PO CAPS
50000.0000 [IU] | ORAL_CAPSULE | ORAL | 0 refills | Status: DC
  Filled 2022-07-02 – 2022-07-31 (×2): qty 4, 28d supply, fill #0

## 2022-07-08 NOTE — Progress Notes (Signed)
Chief Complaint:   OBESITY Alex [name] is here to discuss Alex [name] progress with Alex [name] obesity treatment plan along with follow-up of Alex [name] obesity related diagnoses. Iaan is on the Category 3 Plan and states Alex [name] is following Alex [name] eating plan approximately 50% of the time. Anuel states Alex [name] is doing cardio for 60 minutes 2 times per week.  Today's visit was #: 20 Starting weight: 311 lbs Starting date: 06/25/2021 Today's weight: 310 lbs Today's date: 07/02/2022 Total lbs lost to date: 1 Total lbs lost since last in-office visit: 0  Interim History: Alex [name] has been working on Alex [name] behaviors and finding a balance with meal planning and his changing routine.  He is approaching a healthier attitude regarding food and he feels he will do better with weight loss this Fall.  Subjective:   1. Vitamin D deficiency Laurier is stable on prescription vitamin D, with no side effects noted.  2. Depression with emotional eating behaviors Alex [name] is on Alex [name] and Alex [name], and he feels he is doing better mentally.  He is in a better place in mood and in his relationship with food.  Assessment/Plan:   1. Vitamin D deficiency We will refill prescription Vitamin D 50,000 IU every week for 1 month. Alex [name] will follow-up for routine testing of Vitamin D, at least 2-3 times per year to avoid over-replacement.  - Vitamin D, Ergocalciferol, (DRISDOL) 1.25 MG (50000 UNIT) CAPS capsule; Take 1 capsule (50,000 Units total) by mouth every 7 (seven) days.  Dispense: 4 capsule; Refill: 0  2. Depression with emotional eating behaviors Emotional eating behavior strategies were discussed with the patient today, and we will continue to monitor.  3. Obesity, Current BMI 43.3 Alex [name] is currently in the action stage of change. As such, Alex [name] goal is to continue with weight loss efforts. Alex [name] has  agreed to the Category 3 Plan.   Exercise goals: As is.   Behavioral modification strategies: emotional eating strategies.  Alex [name] has agreed to follow-up with our clinic in 4 weeks. Alex [name] was informed of the importance of frequent follow-up visits to maximize Alex [name] TIU success with intensive lifestyle modifications for Alex [name] multiple [name] conditions.   Objective:   Blood pressure 111/71, pulse 82, temperature 98.3 F (36.8 C), height '5\' 11"'$  (1.803 m), weight (!) 310 lb (140.6 kg), SpO2 98 %. Body mass index is 43.24 kg/m.  General: Cooperative, alert, well developed, in no acute distress. HEENT: Conjunctivae and lids unremarkable. Cardiovascular: Regular rhythm.  Lungs: Normal work of breathing. Neurologic: No focal deficits.   Lab Results  Component Value Date   CREATININE 0.93 03/17/2022   BUN 13 03/17/2022   NA 136 03/17/2022   K 4.5 03/17/2022   CL 99 03/17/2022   CO2 22 03/17/2022   Lab Results  Component Value Date   ALT 34 03/17/2022   AST 30 03/17/2022   ALKPHOS 78 03/17/2022   BILITOT 0.4 03/17/2022   Lab Results  Component Value Date   HGBA1C 4.9 03/17/2022   HGBA1C 5.0 11/26/2021   HGBA1C 5.0 06/25/2021   Lab Results  Component Value Date   INSULIN 10.0 03/17/2022   INSULIN 10.1 11/26/2021   INSULIN 6.5 06/25/2021   Lab Results  Component Value Date   TSH 2.910 11/26/2021   Lab Results  Component Value Date   CHOL 167 03/17/2022   HDL 54 03/17/2022  LDLCALC 101 (H) 03/17/2022   TRIG 64 03/17/2022   CHOLHDL 3.2 03/07/2019   Lab Results  Component Value Date   VD25OH 59.7 03/17/2022   VD25OH 49.8 11/26/2021   VD25OH 33.0 06/25/2021   Lab Results  Component Value Date   WBC 6.4 03/17/2022   HGB 15.3 03/17/2022   HCT 45.7 03/17/2022   MCV 88 03/17/2022   PLT 200 03/17/2022   Lab Results  Component Value Date   IRON 61 11/26/2021   TIBC 373 11/26/2021   FERRITIN 29 (L) 11/26/2021   Attestation  Statements:   Reviewed by clinician on day of visit: allergies, medications, problem list, medical history, surgical history, family history, social history, and previous encounter notes.   I, Trixie Dredge, am acting as transcriptionist for [name], MD.  I have reviewed the above documentation for accuracy and completeness, and I agree with the above. -  [name], MD

## 2022-07-15 DIAGNOSIS — F411 Generalized anxiety disorder: Secondary | ICD-10-CM | POA: Diagnosis not present

## 2022-07-15 DIAGNOSIS — F331 Major depressive disorder, recurrent, moderate: Secondary | ICD-10-CM | POA: Diagnosis not present

## 2022-07-20 ENCOUNTER — Encounter: Payer: Self-pay | Admitting: *Deleted

## 2022-07-21 ENCOUNTER — Other Ambulatory Visit (HOSPITAL_COMMUNITY): Payer: Self-pay

## 2022-07-30 ENCOUNTER — Ambulatory Visit (INDEPENDENT_AMBULATORY_CARE_PROVIDER_SITE_OTHER): Payer: 59 | Admitting: Family Medicine

## 2022-07-30 ENCOUNTER — Other Ambulatory Visit (HOSPITAL_COMMUNITY): Payer: Self-pay

## 2022-07-30 ENCOUNTER — Encounter (INDEPENDENT_AMBULATORY_CARE_PROVIDER_SITE_OTHER): Payer: Self-pay | Admitting: Family Medicine

## 2022-07-30 VITALS — BP 116/75 | HR 75 | Temp 98.0°F | Ht 71.0 in | Wt 315.0 lb

## 2022-07-30 DIAGNOSIS — E669 Obesity, unspecified: Secondary | ICD-10-CM

## 2022-07-30 DIAGNOSIS — F418 Other specified anxiety disorders: Secondary | ICD-10-CM | POA: Diagnosis not present

## 2022-07-30 DIAGNOSIS — Z6841 Body Mass Index (BMI) 40.0 and over, adult: Secondary | ICD-10-CM

## 2022-07-30 DIAGNOSIS — E559 Vitamin D deficiency, unspecified: Secondary | ICD-10-CM

## 2022-07-30 MED ORDER — TOPIRAMATE 50 MG PO TABS
25.0000 mg | ORAL_TABLET | Freq: Every day | ORAL | 0 refills | Status: DC
  Filled 2022-07-30: qty 30, 60d supply, fill #0

## 2022-07-31 ENCOUNTER — Other Ambulatory Visit (HOSPITAL_COMMUNITY): Payer: Self-pay

## 2022-08-04 NOTE — Progress Notes (Signed)
Chief Complaint:   OBESITY Oluwatosin is here to discuss STEPHON WEATHERS progress with [name] obesity treatment plan along with follow-up of ALDON HENGST obesity related diagnoses. Ponciano is on the Category 3 Plan and states LAINE GIOVANETTI is following RHYDIAN BALDI eating plan approximately 40% of the time. Treshun states VICTORIOUS KUNDINGER is doing cardio for 30-40 minutes 2-3 times per week.  Today's visit was #: 21 Starting weight: 311 lbs Starting date: 06/25/2021 Today's weight: 315 lbs Today's date: 07/30/2022 Total lbs lost to date: 0 Total lbs lost since last in-office visit: 0  Interim History: Karthikeya has an high level of stress with increased school stress.  He has not been able to meal plan as much.  He would like to use "clean eats" prepared meals.  He has increased stress eating.  Subjective:   1. Vitamin D deficiency Ollen has been off vitamin D for 2 weeks due to being out.  He has a prescription waiting at the pharmacy.  2. Depression with emotional eating behaviors Xerxes is struggling with emotional eating behaviors, and he notes increased stress with work and school and notes increased stress and comfort eating.  He denies a history of nephrolithiasis.  Assessment/Plan:   1. Vitamin D deficiency Hillel will continue vitamin D, and we will recheck labs in 1 to 2 months.  2. Depression with emotional eating behaviors Carol agreed to start Topamax 50 mg nightly, and we will refill for 1 month.  - topiramate (TOPAMAX) 50 MG tablet; Take 0.5 tablets (25 mg total) by mouth daily.  Dispense: 30 tablet; Refill: 0  3. Obesity, Current BMI 43.9 Mckyle is currently in the action stage of change. As such, GLOVER CAPANO goal is to continue with weight loss efforts. JACQUEL MCCAMISH has agreed to the Category 3 Plan.   Exercise goals: As is.   Behavioral modification strategies: increasing lean protein intake and emotional eating  strategies.  Marland has agreed to follow-up with our clinic in 3 to 4 weeks. DAMONTA COSSEY was informed of the importance of frequent follow-up visits to maximize DOVBER ERNEST success with intensive lifestyle modifications for ZYRION COEY multiple health conditions.   Objective:   Blood pressure 116/75, pulse 75, temperature 98 F (36.7 C), height '5\' 11"'$  (1.803 m), weight (!) 315 lb (142.9 kg), SpO2 99 %. Body mass index is 43.93 kg/m.  General: Cooperative, alert, well developed, in no acute distress. HEENT: Conjunctivae and lids unremarkable. Cardiovascular: Regular rhythm.  Lungs: Normal work of breathing. Neurologic: No focal deficits.   Lab Results  Component Value Date   CREATININE 0.93 03/17/2022   BUN 13 03/17/2022   NA 136 03/17/2022   K 4.5 03/17/2022   CL 99 03/17/2022   CO2 22 03/17/2022   Lab Results  Component Value Date   ALT 34 03/17/2022   AST 30 03/17/2022   ALKPHOS 78 03/17/2022   BILITOT 0.4 03/17/2022   Lab Results  Component Value Date   HGBA1C 4.9 03/17/2022   HGBA1C 5.0 11/26/2021   HGBA1C 5.0 06/25/2021   Lab Results  Component Value Date   INSULIN 10.0 03/17/2022   INSULIN 10.1 11/26/2021   INSULIN 6.5 06/25/2021   Lab Results  Component Value Date   TSH 2.910 11/26/2021   Lab Results  Component Value Date   CHOL 167 03/17/2022   HDL 54 03/17/2022   LDLCALC 101 (H) 03/17/2022   TRIG 64 03/17/2022  CHOLHDL 3.2 03/07/2019   Lab Results  Component Value Date   VD25OH 59.7 03/17/2022   VD25OH 49.8 11/26/2021   VD25OH 33.0 06/25/2021   Lab Results  Component Value Date   WBC 6.4 03/17/2022   HGB 15.3 03/17/2022   HCT 45.7 03/17/2022   MCV 88 03/17/2022   PLT 200 03/17/2022   Lab Results  Component Value Date   IRON 61 11/26/2021   TIBC 373 11/26/2021   FERRITIN 29 (L) 11/26/2021   Attestation Statements:   Reviewed by clinician on day of visit: allergies, medications, problem list, medical history,  surgical history, family history, social history, and previous encounter notes.   I, Trixie Dredge, am acting as transcriptionist for [name], MD.  I have reviewed the above documentation for accuracy and completeness, and I agree with the above. -  [name], MD

## 2022-08-20 ENCOUNTER — Other Ambulatory Visit (HOSPITAL_COMMUNITY): Payer: Self-pay

## 2022-08-20 DIAGNOSIS — F411 Generalized anxiety disorder: Secondary | ICD-10-CM | POA: Diagnosis not present

## 2022-08-20 DIAGNOSIS — F331 Major depressive disorder, recurrent, moderate: Secondary | ICD-10-CM | POA: Diagnosis not present

## 2022-08-20 MED ORDER — BUSPIRONE HCL 15 MG PO TABS
15.0000 mg | ORAL_TABLET | Freq: Three times a day (TID) | ORAL | 0 refills | Status: DC
  Filled 2022-08-20: qty 270, 90d supply, fill #0

## 2022-08-20 MED ORDER — DULOXETINE HCL 30 MG PO CPEP
30.0000 mg | ORAL_CAPSULE | Freq: Every day | ORAL | 0 refills | Status: DC
  Filled 2022-08-20 – 2022-09-09 (×2): qty 90, 90d supply, fill #0

## 2022-08-27 ENCOUNTER — Ambulatory Visit (INDEPENDENT_AMBULATORY_CARE_PROVIDER_SITE_OTHER): Payer: 59 | Admitting: Family Medicine

## 2022-08-27 ENCOUNTER — Encounter (INDEPENDENT_AMBULATORY_CARE_PROVIDER_SITE_OTHER): Payer: Self-pay | Admitting: Family Medicine

## 2022-08-27 ENCOUNTER — Other Ambulatory Visit (HOSPITAL_COMMUNITY): Payer: Self-pay

## 2022-08-27 VITALS — BP 121/75 | HR 73 | Temp 97.8°F | Ht 71.0 in | Wt 317.0 lb

## 2022-08-27 DIAGNOSIS — E559 Vitamin D deficiency, unspecified: Secondary | ICD-10-CM

## 2022-08-27 DIAGNOSIS — Z6841 Body Mass Index (BMI) 40.0 and over, adult: Secondary | ICD-10-CM | POA: Diagnosis not present

## 2022-08-27 DIAGNOSIS — E669 Obesity, unspecified: Secondary | ICD-10-CM

## 2022-08-27 DIAGNOSIS — F418 Other specified anxiety disorders: Secondary | ICD-10-CM

## 2022-08-27 DIAGNOSIS — F411 Generalized anxiety disorder: Secondary | ICD-10-CM | POA: Diagnosis not present

## 2022-08-27 DIAGNOSIS — F331 Major depressive disorder, recurrent, moderate: Secondary | ICD-10-CM | POA: Diagnosis not present

## 2022-08-27 MED ORDER — TOPIRAMATE 50 MG PO TABS
25.0000 mg | ORAL_TABLET | Freq: Every day | ORAL | 0 refills | Status: DC
  Filled 2022-08-27: qty 30, 60d supply, fill #0

## 2022-08-27 MED ORDER — TOPIRAMATE 50 MG PO TABS
50.0000 mg | ORAL_TABLET | Freq: Every day | ORAL | 0 refills | Status: DC
  Filled 2022-08-27 – 2022-09-09 (×2): qty 30, 30d supply, fill #0

## 2022-08-27 MED ORDER — VITAMIN D (ERGOCALCIFEROL) 1.25 MG (50000 UNIT) PO CAPS
50000.0000 [IU] | ORAL_CAPSULE | ORAL | 0 refills | Status: DC
  Filled 2022-08-27 – 2022-09-09 (×2): qty 4, 28d supply, fill #0

## 2022-09-04 ENCOUNTER — Other Ambulatory Visit (HOSPITAL_COMMUNITY): Payer: Self-pay

## 2022-09-07 NOTE — Progress Notes (Unsigned)
Chief Complaint:   OBESITY Alex [name] is here to discuss Alex [name] progress with [name] obesity treatment plan along with follow-up of Alex [name] obesity related diagnoses. Krishon is on the Category 3 Plan and states [name] is following Alex [name] eating plan approximately 25% of the time. Joby states Alex [name] is doing cardio for 30-45 minutes 2-3 times per week.  Today's visit was #: 22 Starting weight: 311 lbs Starting date: 06/25/2021 Today's weight: 317 lbs Today's date: 08/27/2022 Total lbs lost to date: 0 Total lbs lost since last in-office visit: 0  Interim History: Alex [name] has maintained, and he did a limited amount of activities and did miss some meals. He ate some meals on the run. He is till struggling with his schedules between working and graduate school.   Subjective:   1. Vitamin D deficiency [name] is taking vitamin D prescription with no side effects noted.  2. Depression with emotional eating behaviors [name] is taking Topamax with no side effects noted.  He does feel it is helping some.  Assessment/Plan:   1. Vitamin D deficiency Shepherd will continue prescription vitamin D 50,000 IU every week, and we will refill for 1 month.  - Vitamin D, Ergocalciferol, (DRISDOL) 1.25 MG (50000 UNIT) CAPS capsule; Take 1 capsule (50,000 Units total) by mouth every 7 (seven) days.  Dispense: 4 capsule; Refill: 0  2. Depression with emotional eating behaviors Alex [name] will continue Topamax 50 mg once daily, and we will refill for 1 month.  - topiramate (TOPAMAX) 50 MG tablet; Take 1 tablet (50 mg total) by mouth daily.  Dispense: 30 tablet; Refill: 0  3. Obesity, Current BMI 44.2 Alex [name] is currently in the action stage of change. As such, EARLE TROIANO goal is to continue with weight loss efforts. Alex [name] has agreed to the Category 3 Plan.   We will recheck fasting labs at his next visit.   Exercise  goals: As is.   Behavioral modification strategies: increasing lean protein intake, decreasing simple carbohydrates, no skipping meals, emotional eating strategies, and holiday eating strategies .  [name] has agreed to follow-up with our clinic in 3 to 4 weeks. Alex [name] was informed of the importance of frequent follow-up visits to maximize Alex [name] success with intensive lifestyle modifications for Alex [name] multiple health conditions.   Objective:   Blood pressure 121/75, pulse 73, temperature 97.8 F (36.6 C), height '5\' 11"'$  (1.803 m), weight (!) 317 lb (143.8 kg), SpO2 100 %. Body mass index is 44.21 kg/m.  General: Cooperative, alert, well developed, in no acute distress. HEENT: Conjunctivae and lids unremarkable. Cardiovascular: Regular rhythm.  Lungs: Normal work of breathing. Neurologic: No focal deficits.   Lab Results  Component Value Date   CREATININE 0.93 03/17/2022   BUN 13 03/17/2022   NA 136 03/17/2022   K 4.5 03/17/2022   CL 99 03/17/2022   CO2 22 03/17/2022   Lab Results  Component Value Date   ALT 34 03/17/2022   AST 30 03/17/2022   ALKPHOS 78 03/17/2022   BILITOT 0.4 03/17/2022   Lab Results  Component Value Date   HGBA1C 4.9 03/17/2022   HGBA1C 5.0 11/26/2021   HGBA1C 5.0 06/25/2021   Lab Results  Component Value Date   INSULIN 10.0 03/17/2022   INSULIN 10.1 11/26/2021   INSULIN 6.5 06/25/2021   Lab Results  Component Value Date   TSH 2.910 11/26/2021  Lab Results  Component Value Date   CHOL 167 03/17/2022   HDL 54 03/17/2022   LDLCALC 101 (H) 03/17/2022   TRIG 64 03/17/2022   CHOLHDL 3.2 03/07/2019   Lab Results  Component Value Date   VD25OH 59.7 03/17/2022   VD25OH 49.8 11/26/2021   VD25OH 33.0 06/25/2021   Lab Results  Component Value Date   WBC 6.4 03/17/2022   HGB 15.3 03/17/2022   HCT 45.7 03/17/2022   MCV 88 03/17/2022   PLT 200 03/17/2022   Lab Results  Component Value Date   IRON 61  11/26/2021   TIBC 373 11/26/2021   FERRITIN 29 (L) 11/26/2021   Attestation Statements:   Reviewed by clinician on day of visit: allergies, medications, problem list, medical history, surgical history, family history, social history, and previous encounter notes.   I, Trixie Dredge, am acting as transcriptionist for [name], MD.  I have reviewed the above documentation for accuracy and completeness, and I agree with the above. -  [name], MD

## 2022-09-09 ENCOUNTER — Other Ambulatory Visit (HOSPITAL_COMMUNITY): Payer: Self-pay

## 2022-09-15 ENCOUNTER — Telehealth (INDEPENDENT_AMBULATORY_CARE_PROVIDER_SITE_OTHER): Payer: 59 | Admitting: Family Medicine

## 2022-09-15 DIAGNOSIS — F3289 Other specified depressive episodes: Secondary | ICD-10-CM | POA: Diagnosis not present

## 2022-09-15 DIAGNOSIS — E538 Deficiency of other specified B group vitamins: Secondary | ICD-10-CM | POA: Diagnosis not present

## 2022-09-15 DIAGNOSIS — E669 Obesity, unspecified: Secondary | ICD-10-CM

## 2022-09-15 DIAGNOSIS — Z6841 Body Mass Index (BMI) 40.0 and over, adult: Secondary | ICD-10-CM | POA: Diagnosis not present

## 2022-09-15 DIAGNOSIS — E88819 Insulin resistance, unspecified: Secondary | ICD-10-CM | POA: Diagnosis not present

## 2022-09-15 DIAGNOSIS — E559 Vitamin D deficiency, unspecified: Secondary | ICD-10-CM

## 2022-09-16 LAB — CMP14+EGFR
ALT: 31 IU/L (ref 0–44)
AST: 24 IU/L (ref 0–40)
Albumin/Globulin Ratio: 1.8 (ref 1.2–2.2)
Albumin: 4.3 g/dL (ref 4.1–5.1)
Alkaline Phosphatase: 79 IU/L (ref 44–121)
BUN/Creatinine Ratio: 20 (ref 9–20)
BUN: 17 mg/dL (ref 6–20)
Bilirubin Total: 0.5 mg/dL (ref 0.0–1.2)
CO2: 21 mmol/L (ref 20–29)
Calcium: 9 mg/dL (ref 8.7–10.2)
Chloride: 104 mmol/L (ref 96–106)
Creatinine, Ser: 0.87 mg/dL (ref 0.76–1.27)
Globulin, Total: 2.4 g/dL (ref 1.5–4.5)
Glucose: 84 mg/dL (ref 70–99)
Potassium: 4.3 mmol/L (ref 3.5–5.2)
Sodium: 141 mmol/L (ref 134–144)
Total Protein: 6.7 g/dL (ref 6.0–8.5)
eGFR: 113 mL/min/{1.73_m2} (ref >59)

## 2022-09-16 LAB — HEMOGLOBIN A1C
Est. average glucose Bld gHb Est-mCnc: 100 mg/dL
Hgb A1c MFr Bld: 5.1 % (ref 4.8–5.6)

## 2022-09-16 LAB — INSULIN, RANDOM: INSULIN: 5.9 u[IU]/mL (ref 2.6–24.9)

## 2022-09-16 LAB — VITAMIN D 25 HYDROXY (VIT D DEFICIENCY, FRACTURES): Vit D, 25-Hydroxy: 44.3 ng/mL (ref 30.0–100.0)

## 2022-09-16 LAB — LIPID PANEL WITH LDL/HDL RATIO
Cholesterol, Total: 184 mg/dL (ref 100–199)
HDL: 58 mg/dL (ref >39)
LDL Chol Calc (NIH): 115 mg/dL — ABNORMAL HIGH (ref 0–99)
LDL/HDL Ratio: 2 ratio (ref 0.0–3.6)
Triglycerides: 56 mg/dL (ref 0–149)
VLDL Cholesterol Cal: 11 mg/dL (ref 5–40)

## 2022-09-16 LAB — VITAMIN B12: Vitamin B-12: 712 pg/mL (ref 232–1245)

## 2022-09-19 ENCOUNTER — Other Ambulatory Visit (HOSPITAL_COMMUNITY): Payer: Self-pay

## 2022-09-28 ENCOUNTER — Encounter: Payer: Self-pay | Admitting: Physician Assistant

## 2022-09-29 NOTE — Progress Notes (Unsigned)
TeleHealth Visit:  Due to the COVID-19 pandemic, this visit was completed with telemedicine (audio/video) technology to reduce patient and provider exposure as well as to preserve personal protective equipment.   Lavaughn has verbally consented to this TeleHealth visit. The patient is located at home, the provider is located at the [facility] office. The participants in this visit include the listed provider and patient. The visit was conducted today via MyChart video.   Chief Complaint: OBESITY Oliver is here to discuss JESIEL GARATE progress with [name] obesity treatment plan along with follow-up of ELISHUA RADFORD obesity related diagnoses. Woody is on the Category 3 Plan and states ANDRIAN SABALA is following RAMIN ZOLL eating plan approximately (unknown)% of the time. [place] states BRAZOS SANDOVAL is doing 0 minutes 0 times per week.  Today's visit was #: 23 Starting weight: 311 lbs Starting date: 06/25/2021  Interim History: Drayven was fasting and came in for labs.  He is doing better with decreasing his snacking and he is working on meeting his protein goals.  His hunger is mostly controlled especially with meeting his protein goals.  He is planning on increasing his exercise now that grad school is finished.  Subjective:   1. Vitamin D deficiency Jameis has a history of vitamin D deficiency.  He is working on increasing his vitamin D level with supplementation.  No side effects were noted.  He is due for labs.  2. Insulin resistance Carmeron has a history of mildly elevated insulin with normal to low A1c.  He is working on his diet and weight loss, and he is due for labs.  3. Vitamin B12 deficiency Landers has a history of B12 deficiency and he is working on increasing his B12 with his diet.  No side effects were noted.  He is due for labs.  4. Other depression, with emotional eating behaviors Angeles feels the Topamax is helping  decrease cravings.  He notes some mild word searching, but it is not very bothersome.  Assessment/Plan:   1. Vitamin D deficiency We will check labs today. Zaven will follow-up for routine testing of Vitamin D, at least 2-3 times per year to avoid over-replacement.  - VITAMIN D 25 Hydroxy (Vit-D Deficiency, Fractures)  2. Insulin resistance We will check labs today. Mj will continue to work on weight loss, exercise, and decreasing simple carbohydrates to help decrease the risk of diabetes. Alexavier agreed to follow-up with [place] as directed to closely monitor LENOX BINK progress.  - CMP14+EGFR - Hemoglobin A1c - Insulin, random - Lipid Panel With LDL/HDL Ratio  3. Vitamin B12 deficiency We will check labs today. The diagnosis was reviewed with the patient. Orders and follow up as documented in patient record.  - Vitamin B12  4. Other depression, with emotional eating behaviors Kayton will continue Topamax and we will continue to monitor symptoms.  He was advised that the word searching should improve over time.  5. Obesity, Current BMI 44.2 Lea is currently in the action stage of change. As such, MATIN MATTIOLI goal is to continue with weight loss efforts. SABINO DENNING has agreed to the Category 2 Plan.   Behavioral modification strategies: increasing lean protein intake and holiday eating strategies .  Delmon has agreed to follow-up with our clinic in 4 weeks. WARRICK LLERA was informed of the importance of frequent follow-up visits to maximize CHING RABIDEAU success with intensive lifestyle modifications for [name]  J Dise's multiple health conditions.  Lillian was informed we would discuss AHAAN ZOBRIST lab results at [name] next visit unless there is a critical issue that needs to be addressed sooner. Acie agreed to keep Christian Mate Azer's next visit at the agreed upon time to discuss these results.  Objective:   VITALS: Per patient  if applicable, see vitals. GENERAL: Alert and in no acute distress. CARDIOPULMONARY: No increased WOB. Speaking in clear sentences.  PSYCH: Pleasant and cooperative. Speech normal rate and rhythm. Affect is appropriate. Insight and judgement are appropriate. Attention is focused, linear, and appropriate.  NEURO: Oriented as arrived to appointment on time with no prompting.   Lab Results  Component Value Date   CREATININE 0.87 09/15/2022   BUN 17 09/15/2022   NA 141 09/15/2022   K 4.3 09/15/2022   CL 104 09/15/2022   CO2 21 09/15/2022   Lab Results  Component Value Date   ALT 31 09/15/2022   AST 24 09/15/2022   ALKPHOS 79 09/15/2022   BILITOT 0.5 09/15/2022   Lab Results  Component Value Date   HGBA1C 5.1 09/15/2022   HGBA1C 4.9 03/17/2022   HGBA1C 5.0 11/26/2021   HGBA1C 5.0 06/25/2021   Lab Results  Component Value Date   INSULIN 5.9 09/15/2022   INSULIN 10.0 03/17/2022   INSULIN 10.1 11/26/2021   INSULIN 6.5 06/25/2021   Lab Results  Component Value Date   TSH 2.910 11/26/2021   Lab Results  Component Value Date   CHOL 184 09/15/2022   HDL 58 09/15/2022   LDLCALC 115 (H) 09/15/2022   TRIG 56 09/15/2022   CHOLHDL 3.2 03/07/2019   Lab Results  Component Value Date   VD25OH 44.3 09/15/2022   VD25OH 59.7 03/17/2022   VD25OH 49.8 11/26/2021   Lab Results  Component Value Date   WBC 6.4 03/17/2022   HGB 15.3 03/17/2022   HCT 45.7 03/17/2022   MCV 88 03/17/2022   PLT 200 03/17/2022   Lab Results  Component Value Date   IRON 61 11/26/2021   TIBC 373 11/26/2021   FERRITIN 29 (L) 11/26/2021    Attestation Statements:   Reviewed by clinician on day of visit: allergies, medications, problem list, medical history, surgical history, family history, social history, and previous encounter notes.   I, Trixie Dredge, am acting as transcriptionist for [name], MD.  I have reviewed the above documentation for accuracy and completeness, and I agree with  the above. - [name], MD

## 2022-09-30 ENCOUNTER — Other Ambulatory Visit: Payer: Self-pay | Admitting: Family Medicine

## 2022-09-30 DIAGNOSIS — J45990 Exercise induced bronchospasm: Secondary | ICD-10-CM

## 2022-10-01 ENCOUNTER — Other Ambulatory Visit (HOSPITAL_COMMUNITY): Payer: Self-pay

## 2022-10-01 ENCOUNTER — Other Ambulatory Visit (INDEPENDENT_AMBULATORY_CARE_PROVIDER_SITE_OTHER): Payer: Self-pay | Admitting: Family Medicine

## 2022-10-01 DIAGNOSIS — F331 Major depressive disorder, recurrent, moderate: Secondary | ICD-10-CM | POA: Diagnosis not present

## 2022-10-01 DIAGNOSIS — F411 Generalized anxiety disorder: Secondary | ICD-10-CM | POA: Diagnosis not present

## 2022-10-01 DIAGNOSIS — E559 Vitamin D deficiency, unspecified: Secondary | ICD-10-CM

## 2022-10-01 MED ORDER — ALBUTEROL SULFATE HFA 108 (90 BASE) MCG/ACT IN AERS
1.0000 | INHALATION_SPRAY | Freq: Four times a day (QID) | RESPIRATORY_TRACT | 11 refills | Status: AC | PRN
  Filled 2022-10-01: qty 6.7, 25d supply, fill #0

## 2022-10-08 ENCOUNTER — Encounter: Payer: Self-pay | Admitting: *Deleted

## 2022-10-11 ENCOUNTER — Other Ambulatory Visit (INDEPENDENT_AMBULATORY_CARE_PROVIDER_SITE_OTHER): Payer: Self-pay | Admitting: Family Medicine

## 2022-10-11 DIAGNOSIS — E559 Vitamin D deficiency, unspecified: Secondary | ICD-10-CM

## 2022-10-11 DIAGNOSIS — F418 Other specified anxiety disorders: Secondary | ICD-10-CM

## 2022-10-13 ENCOUNTER — Encounter (INDEPENDENT_AMBULATORY_CARE_PROVIDER_SITE_OTHER): Payer: Self-pay | Admitting: Family Medicine

## 2022-10-13 ENCOUNTER — Ambulatory Visit (INDEPENDENT_AMBULATORY_CARE_PROVIDER_SITE_OTHER): Payer: 59 | Admitting: Family Medicine

## 2022-10-13 ENCOUNTER — Other Ambulatory Visit (HOSPITAL_COMMUNITY): Payer: Self-pay

## 2022-10-13 VITALS — BP 119/71 | HR 69 | Temp 98.0°F | Ht 71.0 in | Wt 324.0 lb

## 2022-10-13 DIAGNOSIS — E7800 Pure hypercholesterolemia, unspecified: Secondary | ICD-10-CM

## 2022-10-13 DIAGNOSIS — E559 Vitamin D deficiency, unspecified: Secondary | ICD-10-CM

## 2022-10-13 DIAGNOSIS — F418 Other specified anxiety disorders: Secondary | ICD-10-CM

## 2022-10-13 DIAGNOSIS — Z6841 Body Mass Index (BMI) 40.0 and over, adult: Secondary | ICD-10-CM

## 2022-10-13 DIAGNOSIS — E669 Obesity, unspecified: Secondary | ICD-10-CM | POA: Diagnosis not present

## 2022-10-13 MED ORDER — TOPIRAMATE 50 MG PO TABS
50.0000 mg | ORAL_TABLET | Freq: Every day | ORAL | 0 refills | Status: DC
  Filled 2022-10-13: qty 30, 30d supply, fill #0

## 2022-10-13 MED ORDER — VITAMIN D (ERGOCALCIFEROL) 1.25 MG (50000 UNIT) PO CAPS
50000.0000 [IU] | ORAL_CAPSULE | ORAL | 0 refills | Status: DC
  Filled 2022-10-13: qty 4, 28d supply, fill #0

## 2022-10-14 ENCOUNTER — Other Ambulatory Visit (HOSPITAL_COMMUNITY): Payer: Self-pay

## 2022-11-03 NOTE — Progress Notes (Signed)
Chief Complaint:   OBESITY Alex [name] is here to discuss Alex [name] progress with Alex [name] obesity treatment plan along with follow-up of Alex [name] obesity related diagnoses. Alex [name] is on the Category 2 Plan and states Alex [name] is following Alex [name] eating plan approximately 50% of the time. Alex [name] states Alex [name] is exercising for 30 minutes 2 times per week.  Today's visit was #: 24 Starting weight: 311 lbs Starting date: 06/25/2021 Today's weight: 324 lbs Today's date: 10/13/2022 Total lbs lost to date: 0 Total lbs lost since last in-office visit: 0  Interim History: Alex [name] has indulged over the holidays.  He is working on portion control, but he plans on getting back on track after Christmas.  Subjective:   1. Pure hypercholesterolemia Alex [name]'s last LDL was above goal, but his HDL and triglycerides were at goal.  He is not on a statin.  I discussed labs with the patient today.  2. Vitamin D deficiency Alex [name]'s last vitamin D level had dropped and is now below goal despite being on vitamin D prescription.  I discussed labs with the patient today.  3. Depression with emotional eating behaviors Alex [name] feels he is getting more benefit from Topamax.  Assessment/Plan:   1. Pure hypercholesterolemia Alex [name] will continue with his diet and exercise, and we will continue to monitor.  2. Vitamin D deficiency Alex [name] will continue prescription vitamin D 50,000 IU every week, and we will refill for 1 month.  - Vitamin D, Ergocalciferol, (DRISDOL) 1.25 MG (50000 UNIT) CAPS capsule; Take 1 capsule (50,000 Units total) by mouth every 7 (seven) days.  Dispense: 4 capsule; Refill: 0  3. Depression with emotional eating behaviors Alex [name] will continue Topamax 50 mg once daily, and we will refill for 1 month.  - topiramate (TOPAMAX) 50 MG tablet; Take 1 tablet (50 mg total) by mouth daily.  Dispense: 30 tablet; Refill: 0  4. Obesity,  Current BMI 45.2 Alex [name] is currently in the action stage of change. As such, Alex [name] goal is to continue with weight loss efforts. Alex [name] has agreed to the Category 2 Plan.   Exercise goals: As is.   Behavioral modification strategies: increasing lean protein intake and holiday eating strategies .  Alex [name] has agreed to follow-up with our clinic in 4 weeks. Alex [name] was informed of the importance of frequent follow-up visits to maximize Alex [name] success with intensive lifestyle modifications for Alex [name] multiple health conditions.   Objective:   Blood pressure 119/71, pulse 69, temperature 98 F (36.7 C), height '5\' 11"'$  (1.803 m), weight (!) 324 lb (147 kg), SpO2 99 %. Body mass index is 45.19 kg/m.  General: Cooperative, alert, well developed, in no acute distress. HEENT: Conjunctivae and lids unremarkable. Cardiovascular: Regular rhythm.  Lungs: Normal work of breathing. Neurologic: No focal deficits.   Lab Results  Component Value Date   CREATININE 0.87 09/15/2022   BUN 17 09/15/2022   NA 141 09/15/2022   K 4.3 09/15/2022   CL 104 09/15/2022   CO2 21 09/15/2022   Lab Results  Component Value Date   ALT 31 09/15/2022   AST 24 09/15/2022   ALKPHOS 79 09/15/2022   BILITOT 0.5 09/15/2022   Lab Results  Component Value Date   HGBA1C 5.1 09/15/2022   HGBA1C 4.9 03/17/2022   HGBA1C 5.0 11/26/2021   HGBA1C 5.0 06/25/2021   Lab Results  Component Value Date   INSULIN  5.9 09/15/2022   INSULIN 10.0 03/17/2022   INSULIN 10.1 11/26/2021   INSULIN 6.5 06/25/2021   Lab Results  Component Value Date   TSH 2.910 11/26/2021   Lab Results  Component Value Date   CHOL 184 09/15/2022   HDL 58 09/15/2022   LDLCALC 115 (H) 09/15/2022   TRIG 56 09/15/2022   CHOLHDL 3.2 03/07/2019   Lab Results  Component Value Date   VD25OH 44.3 09/15/2022   VD25OH 59.7 03/17/2022   VD25OH 49.8 11/26/2021   Lab Results  Component Value  Date   WBC 6.4 03/17/2022   HGB 15.3 03/17/2022   HCT 45.7 03/17/2022   MCV 88 03/17/2022   PLT 200 03/17/2022   Lab Results  Component Value Date   IRON 61 11/26/2021   TIBC 373 11/26/2021   FERRITIN 29 (L) 11/26/2021   Attestation Statements:   Reviewed by clinician on day of visit: allergies, medications, problem list, medical history, surgical history, family history, social history, and previous encounter notes.   I, Trixie Dredge, am acting as transcriptionist for [name], MD.  I have reviewed the above documentation for accuracy and completeness, and I agree with the above. -  [name], MD

## 2022-11-06 DIAGNOSIS — F331 Major depressive disorder, recurrent, moderate: Secondary | ICD-10-CM | POA: Diagnosis not present

## 2022-11-06 DIAGNOSIS — F411 Generalized anxiety disorder: Secondary | ICD-10-CM | POA: Diagnosis not present

## 2022-11-10 ENCOUNTER — Ambulatory Visit (INDEPENDENT_AMBULATORY_CARE_PROVIDER_SITE_OTHER): Payer: 59 | Admitting: Physician Assistant

## 2022-11-10 ENCOUNTER — Encounter (INDEPENDENT_AMBULATORY_CARE_PROVIDER_SITE_OTHER): Payer: Self-pay | Admitting: Physician Assistant

## 2022-11-10 ENCOUNTER — Other Ambulatory Visit (HOSPITAL_COMMUNITY): Payer: Self-pay

## 2022-11-10 VITALS — BP 109/76 | HR 66 | Temp 98.2°F | Ht 71.0 in | Wt 324.0 lb

## 2022-11-10 DIAGNOSIS — E669 Obesity, unspecified: Secondary | ICD-10-CM

## 2022-11-10 DIAGNOSIS — F418 Other specified anxiety disorders: Secondary | ICD-10-CM

## 2022-11-10 DIAGNOSIS — Z6841 Body Mass Index (BMI) 40.0 and over, adult: Secondary | ICD-10-CM

## 2022-11-10 DIAGNOSIS — E559 Vitamin D deficiency, unspecified: Secondary | ICD-10-CM | POA: Diagnosis not present

## 2022-11-10 MED ORDER — TOPIRAMATE 50 MG PO TABS
50.0000 mg | ORAL_TABLET | Freq: Every day | ORAL | 0 refills | Status: DC
  Filled 2022-11-10: qty 30, 30d supply, fill #0

## 2022-11-10 MED ORDER — VITAMIN D (ERGOCALCIFEROL) 1.25 MG (50000 UNIT) PO CAPS
50000.0000 [IU] | ORAL_CAPSULE | ORAL | 0 refills | Status: DC
  Filled 2022-11-10: qty 4, 28d supply, fill #0

## 2022-11-12 ENCOUNTER — Other Ambulatory Visit (HOSPITAL_COMMUNITY): Payer: Self-pay

## 2022-11-12 ENCOUNTER — Encounter: Payer: Self-pay | Admitting: Physician Assistant

## 2022-11-12 ENCOUNTER — Ambulatory Visit (INDEPENDENT_AMBULATORY_CARE_PROVIDER_SITE_OTHER): Payer: 59 | Admitting: Physician Assistant

## 2022-11-12 VITALS — BP 138/82 | HR 80 | Temp 97.3°F | Ht 71.0 in | Wt 329.8 lb

## 2022-11-12 DIAGNOSIS — J340 Abscess, furuncle and carbuncle of nose: Secondary | ICD-10-CM | POA: Diagnosis not present

## 2022-11-12 MED ORDER — DOXYCYCLINE HYCLATE 100 MG PO TABS
100.0000 mg | ORAL_TABLET | Freq: Two times a day (BID) | ORAL | 0 refills | Status: AC
  Filled 2022-11-12: qty 14, 7d supply, fill #0

## 2022-11-12 MED ORDER — MUPIROCIN 2 % EX OINT
1.0000 | TOPICAL_OINTMENT | Freq: Two times a day (BID) | CUTANEOUS | 11 refills | Status: AC
  Filled 2022-11-12: qty 22, 7d supply, fill #0

## 2022-11-12 NOTE — Progress Notes (Signed)
Subjective:    Patient ID: Alex [name], adult    DOB: [birth year], 42 y.o.   MRN: [id number]  Chief Complaint  Patient presents with   Nose Infection    Pt in the office for recurrent infection; pt gets infection on left side of nostril on the outside, and especially if being sick. Pt states it is currently painful and very tender, pt says not always the same side of the nose when becoming inflamed or infected    HPI Patient is in today for nasal infection. Started three days ago. Left sided. Recurrent infection. Usually goes away with mupirocin. Works in trauma OR. No fever. No chills. No sinus pain or pressure. No ear pain.   Past Medical History:  Diagnosis Date   Anxiety    Arterial embolism of left leg (HCC)    Asthma    exercise-induced   Constipation    Depression    Infertility male    Palpitation    SOB (shortness of breath)    Vitamin D deficiency     Past Surgical History:  Procedure Laterality Date   COLONOSCOPY  2017   bleeding - negative scope   WISDOM TOOTH EXTRACTION      Family History  Problem Relation Age of Onset   Anxiety disorder Mother    Depression Mother    Obesity Mother    Obesity Father    Hyperlipidemia Father    Hypertension Father    Depression Father    Anxiety disorder Father    Asthma Father    Cancer Maternal Grandfather        skin & lung   Emphysema Maternal Grandfather    Cancer Paternal Grandfather        leukemia    Social History   Tobacco Use   Smoking status: Never   Smokeless tobacco: Never  Vaping Use   Vaping Use: Never used  Substance Use Topics   Alcohol use: Not Currently    Comment: occasional   Drug use: No     No Known Allergies  Review of Systems NEGATIVE UNLESS OTHERWISE INDICATED IN HPI      Objective:     BP 138/82 (BP Location: Left Arm)   Pulse 80   Temp (!) 97.3 F (36.3 C) (Temporal)   Ht '5\' 11"'$  (1.803 m)   Wt (!) 329 lb 12.8 oz (149.6 kg)   SpO2 99%   BMI 46.00 kg/m    Wt Readings from Last 3 Encounters:  11/12/22 (!) 329 lb 12.8 oz (149.6 kg)  11/10/22 (!) 324 lb (147 kg)  10/13/22 (!) 324 lb (147 kg)    BP Readings from Last 3 Encounters:  11/12/22 138/82  11/10/22 109/76  10/13/22 119/71     Physical Exam Vitals and nursing note reviewed.  Constitutional:      Appearance: Normal appearance.  HENT:     Right Ear: Tympanic membrane, ear canal and external ear normal.     Left Ear: Tympanic membrane, ear canal and external ear normal.     Nose: No congestion or rhinorrhea.     Comments: See photo below - left externa and internal nare, erythematous, tender, few pustules present    Mouth/Throat:     Mouth: Mucous membranes are moist.     Pharynx: Oropharynx is clear.  Eyes:     Extraocular Movements: Extraocular movements intact.     Conjunctiva/sclera: Conjunctivae normal.     Pupils: Pupils are equal, round, and reactive  to light.  Neurological:     Mental Status: Alex [name] is alert.         Assessment & Plan:  Cellulitis of nose  Other orders -     Doxycycline Hyclate; Take 1 tablet (100 mg total) by mouth 2 (two) times daily for 7 days.  Dispense: 14 tablet; Refill: 0 -     Mupirocin; Apply 1 Application topically 2 (two) times daily.  Dispense: 22 g; Refill: 11   Take doxycycline and mupirocin as directed. Pt aware of risks vs benefits and possible adverse reactions. Monitor closely. For any worsening pain, redness, or if fevers develop, need recheck right away. Pt agreeable and understanding.      Return if symptoms worsen or fail to improve.     Rosalin Buster M Desirie Minteer, PA-C

## 2022-11-17 NOTE — Progress Notes (Unsigned)
Chief Complaint:   OBESITY Alex [name] is here to discuss Alex [name] progress with Alex [name] obesity treatment plan along with follow-up of Alex [name] obesity related diagnoses. Alex [name] is on the Category 2 Plan and states Alex [name] is following Alex [name] eating plan approximately 50% of the time. Alex [name] states Alex [name] is doing cardio 30-45 minutes 2 times per week.  Today's visit was #: 25 Starting weight: 311 lbs Starting date: 06/25/2021 Today's weight: 324 lbs Today's date: 11/10/2022 Total lbs lost to date: 0 lbs Total lbs lost since last in-office visit: 0  Interim History: Alex [name] has done well with weight maintenance over the holidays.  Just graduated /completed his masters, but does not know what they would like to pursue. Reports wife is now coming to [place] as well and this is helping with compliance with the nutrition plan and accountability.  Reports focusing on small goals- working on avoiding in between meal snacking.  Working Sat-Sun- Monday in the [place] at [facility].   Subjective:   1. Vitamin D deficiency Alex [name] is on Ergocalciferol once weekly--Denies any side effects.  Level of 44 on 09/18/22- nearing goal.  2. Depression with emotional eating behaviors Taking Topamax 50 mg every night-Denies any side effects.  Following up with Psychiatry about feeling a little stuck with chronic low grade depression.  Helping with binge eating and feels good benefit from Topamax.  Assessment/Plan:   1. Vitamin D deficiency Continue/Refill Ergocalciferol once weekly for 1 month with 0 refills.  Recheck level 2-3 times a year to avoid over supplementation.   -Refill Vitamin D, Ergocalciferol, (DRISDOL) 1.25 MG (50000 UNIT) CAPS capsule; Take 1 capsule (50,000 Units total) by mouth every 7 (seven) days.  Dispense: 4 capsule; Refill: 0  2. Depression with emotional eating behaviors Continue/Refill Topamax 50 mg daily for 1 month with 0  refills. Discussed strategies for emotional eating behaviors and plans follow up with Psychiatry and feels stuck with some chronic low grade depression symptoms. Will follow with Psychiatry.   -Refill topiramate (TOPAMAX) 50 MG tablet; Take 1 tablet (50 mg total) by mouth daily.  Dispense: 30 tablet; Refill: 0  3. Obesity, Current BMI 45.2 Alex [name] is currently in the action stage of change. As such, Alex [name] goal is to continue with weight loss efforts. Alex [name] has agreed to the Category 2 Plan.   Exercise goals: As is.  Behavioral modification strategies: increasing lean protein intake, decreasing simple carbohydrates, planning for success, and keeping a strict food journal.  Alex [name] has agreed to follow-up with our clinic in 4 weeks. Alex [name] was informed of the importance of frequent follow-up visits to maximize Alex [name] success with intensive lifestyle modifications for Alex [name] multiple health conditions.   Objective:   Blood pressure 109/76, pulse 66, temperature 98.2 F (36.8 C), height '5\' 11"'$  (1.803 m), weight (!) 324 lb (147 kg), SpO2 99 %. Body mass index is 45.19 kg/m.  General: Cooperative, alert, well developed, in no acute distress. HEENT: Conjunctivae and lids unremarkable. Cardiovascular: Regular rhythm.  Lungs: Normal work of breathing. Neurologic: No focal deficits.   Lab Results  Component Value Date   CREATININE 0.87 09/15/2022   BUN 17 09/15/2022   NA 141 09/15/2022   K 4.3 09/15/2022   CL 104 09/15/2022   CO2 21 09/15/2022   Lab Results  Component Value Date   ALT 31 09/15/2022   AST 24 09/15/2022  ALKPHOS 79 09/15/2022   BILITOT 0.5 09/15/2022   Lab Results  Component Value Date   HGBA1C 5.1 09/15/2022   HGBA1C 4.9 03/17/2022   HGBA1C 5.0 11/26/2021   HGBA1C 5.0 06/25/2021   Lab Results  Component Value Date   INSULIN 5.9 09/15/2022   INSULIN 10.0 03/17/2022   INSULIN 10.1 11/26/2021   INSULIN 6.5  06/25/2021   Lab Results  Component Value Date   TSH 2.910 11/26/2021   Lab Results  Component Value Date   CHOL 184 09/15/2022   HDL 58 09/15/2022   LDLCALC 115 (H) 09/15/2022   TRIG 56 09/15/2022   CHOLHDL 3.2 03/07/2019   Lab Results  Component Value Date   VD25OH 44.3 09/15/2022   VD25OH 59.7 03/17/2022   VD25OH 49.8 11/26/2021   Lab Results  Component Value Date   WBC 6.4 03/17/2022   HGB 15.3 03/17/2022   HCT 45.7 03/17/2022   MCV 88 03/17/2022   PLT 200 03/17/2022   Lab Results  Component Value Date   IRON 61 11/26/2021   TIBC 373 11/26/2021   FERRITIN 29 (L) 11/26/2021   Attestation Statements:   Reviewed by clinician on day of visit: allergies, medications, problem list, medical history, surgical history, family history, social history, and previous encounter notes.  I, Brendell Tyus, am acting as transcriptionist for [employer], PA.  I have reviewed the above documentation for accuracy and completeness, and I agree with the above. -  Calden Dorsey,PA-C

## 2022-11-18 ENCOUNTER — Ambulatory Visit: Payer: Self-pay | Admitting: Physician Assistant

## 2022-11-19 ENCOUNTER — Other Ambulatory Visit (HOSPITAL_COMMUNITY): Payer: Self-pay

## 2022-11-19 DIAGNOSIS — F411 Generalized anxiety disorder: Secondary | ICD-10-CM | POA: Diagnosis not present

## 2022-11-19 DIAGNOSIS — F331 Major depressive disorder, recurrent, moderate: Secondary | ICD-10-CM | POA: Diagnosis not present

## 2022-11-19 MED ORDER — DULOXETINE HCL 60 MG PO CPEP
60.0000 mg | ORAL_CAPSULE | Freq: Every day | ORAL | 0 refills | Status: DC
  Filled 2022-11-19: qty 15, 15d supply, fill #0

## 2022-11-20 ENCOUNTER — Ambulatory Visit: Payer: Self-pay | Admitting: Physician Assistant

## 2022-12-03 ENCOUNTER — Other Ambulatory Visit (HOSPITAL_COMMUNITY): Payer: Self-pay

## 2022-12-03 DIAGNOSIS — F331 Major depressive disorder, recurrent, moderate: Secondary | ICD-10-CM | POA: Diagnosis not present

## 2022-12-03 DIAGNOSIS — F411 Generalized anxiety disorder: Secondary | ICD-10-CM | POA: Diagnosis not present

## 2022-12-03 MED ORDER — BUSPIRONE HCL 15 MG PO TABS
15.0000 mg | ORAL_TABLET | Freq: Three times a day (TID) | ORAL | 0 refills | Status: DC
  Filled 2022-12-03: qty 270, 90d supply, fill #0

## 2022-12-03 MED ORDER — DULOXETINE HCL 60 MG PO CPEP
60.0000 mg | ORAL_CAPSULE | Freq: Every day | ORAL | 0 refills | Status: DC
  Filled 2022-12-03: qty 30, 30d supply, fill #0

## 2022-12-04 ENCOUNTER — Other Ambulatory Visit: Payer: Self-pay

## 2022-12-08 ENCOUNTER — Encounter (INDEPENDENT_AMBULATORY_CARE_PROVIDER_SITE_OTHER): Payer: Self-pay | Admitting: Family Medicine

## 2022-12-08 ENCOUNTER — Other Ambulatory Visit (HOSPITAL_COMMUNITY): Payer: Self-pay

## 2022-12-08 ENCOUNTER — Encounter (HOSPITAL_COMMUNITY): Payer: Self-pay

## 2022-12-08 ENCOUNTER — Ambulatory Visit (INDEPENDENT_AMBULATORY_CARE_PROVIDER_SITE_OTHER): Payer: 59 | Admitting: Family Medicine

## 2022-12-08 VITALS — BP 104/68 | HR 75 | Temp 98.6°F | Ht 71.0 in | Wt 325.0 lb

## 2022-12-08 DIAGNOSIS — E669 Obesity, unspecified: Secondary | ICD-10-CM

## 2022-12-08 DIAGNOSIS — E559 Vitamin D deficiency, unspecified: Secondary | ICD-10-CM

## 2022-12-08 DIAGNOSIS — F418 Other specified anxiety disorders: Secondary | ICD-10-CM | POA: Diagnosis not present

## 2022-12-08 DIAGNOSIS — F331 Major depressive disorder, recurrent, moderate: Secondary | ICD-10-CM | POA: Diagnosis not present

## 2022-12-08 DIAGNOSIS — Z6841 Body Mass Index (BMI) 40.0 and over, adult: Secondary | ICD-10-CM

## 2022-12-08 DIAGNOSIS — F411 Generalized anxiety disorder: Secondary | ICD-10-CM | POA: Diagnosis not present

## 2022-12-08 MED ORDER — VITAMIN D (ERGOCALCIFEROL) 1.25 MG (50000 UNIT) PO CAPS
50000.0000 [IU] | ORAL_CAPSULE | ORAL | 0 refills | Status: DC
  Filled 2022-12-08: qty 4, 28d supply, fill #0

## 2022-12-08 MED ORDER — TOPIRAMATE 50 MG PO TABS
50.0000 mg | ORAL_TABLET | Freq: Every day | ORAL | 0 refills | Status: DC
  Filled 2022-12-08: qty 30, 30d supply, fill #0

## 2022-12-11 ENCOUNTER — Other Ambulatory Visit (HOSPITAL_COMMUNITY): Payer: Self-pay

## 2022-12-22 NOTE — Progress Notes (Unsigned)
Chief Complaint:   OBESITY Alex [name] is here to discuss JAVAUGHN GERREN progress with [name] obesity treatment plan along with follow-up of HRISHIKESH [name] obesity related diagnoses. [name] is on the Category 2 Plan and states Alex [name] is following Alex [name] eating plan approximately 80% of the time. [name] states Alex [name] is doing cardio and weights for 30-45 minutes 3 times per week.  Today's visit was #: 40 Starting weight: 311 lbs Starting date: 06/25/2021 Today's weight: 325 lbs Today's date: 12/08/2022 Total lbs lost to date: 0 Total lbs lost since last in-office visit: 0  Interim History: [name] has increased his exercise recently, both resistance and cardio. His wife is eating healthier and this makes it easier to eat healthier.   Subjective:   1. Vitamin D deficiency Alex [name] is on Vitamin D, and his last level was below goal. He notes some increased fatigue, but this may be due more to his SAD.   2. Emotional Eating Behavior Shin had his Cymbalta increased from 30 mg to 60 mg.   Assessment/Plan:   1. Vitamin D deficiency [name] will continue prescription Vitamin D, and we will refill for 1 month.   - Vitamin D, Ergocalciferol, (DRISDOL) 1.25 MG (50000 UNIT) CAPS capsule; Take 1 capsule (50,000 Units total) by mouth every 7 (seven) days.  Dispense: 4 capsule; Refill: 0  2. Emotional Eating Behavior [name] will continue Topamax, and we will refill for 1 month.   - topiramate (TOPAMAX) 50 MG tablet; Take 1 tablet (50 mg total) by mouth daily.  Dispense: 30 tablet; Refill: 0  3. BMI 45.0-49.9, adult (Alex)  4. Obesity, Beginning BMI 44.62 Alex [name] is currently in the action stage of change. As such, Alex [name] goal is to continue with weight loss efforts. Alex [name] has agreed to the Category 2 Plan.   Exercise goals: As is.   Behavioral modification strategies: increasing lean protein intake, meal planning and  cooking strategies, and travel eating strategies.  Ming has agreed to follow-up with our clinic in 4 weeks. Alex [name] was informed of the importance of frequent follow-up visits to maximize CAIN PANTHER success with intensive lifestyle modifications for Alex [name] multiple health conditions.   Objective:   Blood pressure 104/68, pulse 75, temperature 98.6 F (37 C), height '5\' 11"'$  (1.803 m), weight (!) 325 lb (147.4 kg), SpO2 98 %. Body mass index is 45.33 kg/m.  General: Cooperative, alert, well developed, in no acute distress. HEENT: Conjunctivae and lids unremarkable. Cardiovascular: Regular rhythm.  Lungs: Normal work of breathing. Neurologic: No focal deficits.   Lab Results  Component Value Date   CREATININE 0.87 09/15/2022   BUN 17 09/15/2022   NA 141 09/15/2022   K 4.3 09/15/2022   CL 104 09/15/2022   CO2 21 09/15/2022   Lab Results  Component Value Date   ALT 31 09/15/2022   AST 24 09/15/2022   ALKPHOS 79 09/15/2022   BILITOT 0.5 09/15/2022   Lab Results  Component Value Date   HGBA1C 5.1 09/15/2022   HGBA1C 4.9 03/17/2022   HGBA1C 5.0 11/26/2021   HGBA1C 5.0 06/25/2021   Lab Results  Component Value Date   INSULIN 5.9 09/15/2022   INSULIN 10.0 03/17/2022   INSULIN 10.1 11/26/2021   INSULIN 6.5 06/25/2021   Lab Results  Component Value Date   TSH 2.910 11/26/2021   Lab Results  Component Value Date   CHOL 184 09/15/2022  HDL 58 09/15/2022   LDLCALC 115 (H) 09/15/2022   TRIG 56 09/15/2022   CHOLHDL 3.2 03/07/2019   Lab Results  Component Value Date   VD25OH 44.3 09/15/2022   VD25OH 59.7 03/17/2022   VD25OH 49.8 11/26/2021   Lab Results  Component Value Date   WBC 6.4 03/17/2022   HGB 15.3 03/17/2022   HCT 45.7 03/17/2022   MCV 88 03/17/2022   PLT 200 03/17/2022   Lab Results  Component Value Date   IRON 61 11/26/2021   TIBC 373 11/26/2021   FERRITIN 29 (L) 11/26/2021   Attestation Statements:   Reviewed by  clinician on day of visit: allergies, medications, problem list, medical history, surgical history, family history, social history, and previous encounter notes.   I, Trixie Dredge, am acting as transcriptionist for Alex Nip, MD.  I have reviewed the above documentation for accuracy and completeness, and I agree with the above. -  Alex Nip, MD

## 2022-12-31 ENCOUNTER — Other Ambulatory Visit (HOSPITAL_COMMUNITY): Payer: Self-pay

## 2022-12-31 DIAGNOSIS — F411 Generalized anxiety disorder: Secondary | ICD-10-CM | POA: Diagnosis not present

## 2022-12-31 DIAGNOSIS — F331 Major depressive disorder, recurrent, moderate: Secondary | ICD-10-CM | POA: Diagnosis not present

## 2022-12-31 MED ORDER — DULOXETINE HCL 60 MG PO CPEP
60.0000 mg | ORAL_CAPSULE | Freq: Every day | ORAL | 0 refills | Status: DC
  Filled 2022-12-31: qty 60, 60d supply, fill #0

## 2023-01-05 ENCOUNTER — Other Ambulatory Visit (HOSPITAL_COMMUNITY): Payer: Self-pay

## 2023-01-05 ENCOUNTER — Ambulatory Visit (INDEPENDENT_AMBULATORY_CARE_PROVIDER_SITE_OTHER): Payer: 59 | Admitting: Family Medicine

## 2023-01-05 ENCOUNTER — Encounter (INDEPENDENT_AMBULATORY_CARE_PROVIDER_SITE_OTHER): Payer: Self-pay | Admitting: Family Medicine

## 2023-01-05 VITALS — BP 114/71 | HR 82 | Temp 97.9°F | Ht 71.0 in | Wt 327.0 lb

## 2023-01-05 DIAGNOSIS — E559 Vitamin D deficiency, unspecified: Secondary | ICD-10-CM

## 2023-01-05 DIAGNOSIS — F418 Other specified anxiety disorders: Secondary | ICD-10-CM | POA: Diagnosis not present

## 2023-01-05 DIAGNOSIS — Z6841 Body Mass Index (BMI) 40.0 and over, adult: Secondary | ICD-10-CM

## 2023-01-05 DIAGNOSIS — E669 Obesity, unspecified: Secondary | ICD-10-CM | POA: Diagnosis not present

## 2023-01-05 DIAGNOSIS — F411 Generalized anxiety disorder: Secondary | ICD-10-CM | POA: Diagnosis not present

## 2023-01-05 DIAGNOSIS — F331 Major depressive disorder, recurrent, moderate: Secondary | ICD-10-CM | POA: Diagnosis not present

## 2023-01-05 MED ORDER — VITAMIN D (ERGOCALCIFEROL) 1.25 MG (50000 UNIT) PO CAPS
50000.0000 [IU] | ORAL_CAPSULE | ORAL | 0 refills | Status: DC
  Filled 2023-01-05: qty 4, 28d supply, fill #0

## 2023-01-05 MED ORDER — TOPIRAMATE 50 MG PO TABS
50.0000 mg | ORAL_TABLET | Freq: Every day | ORAL | 0 refills | Status: DC
  Filled 2023-01-05: qty 30, 30d supply, fill #0

## 2023-01-13 NOTE — Progress Notes (Unsigned)
Chief Complaint:   OBESITY Alex [name] is here to discuss Alex [name] progress with Alex [name] obesity treatment plan along with follow-up of Alex [name] obesity related diagnoses. Alex [name] is on the Category 2 Plan and states Alex [name] is following Alex [name] eating plan approximately 25% of the time. Alex [name] states Alex [name] is lifting weights for 60 minutes 2 times per week.  Today's visit was #: 63 Starting weight: 311 lbs Starting date: 06/25/2021 Today's weight: 327 lbs Today's date: 01/05/2023 Total lbs lost to date: 0 Total lbs lost since last in-office visit: 0  Interim History: Alex [name] is retaining some fluid after recovering from the flu.  He is working on getting back on track with his diet and exercise.  He is doing more "good eating" and meal prep for lunch, and he feels this will help him meet his nutritional goals.  Subjective:   1. Vitamin D deficiency Alex [name] is on vitamin D prescription, and he denies nausea, vomiting, or muscle weakness or other signs of over replacement.  2. Emotional Eating Behavior Alex [name] is stable on Topamax, and he is mindful about decreasing emotional eating behavior.  No side effects were noted.  Assessment/Plan:   1. Vitamin D deficiency Alex [name] will continue prescription vitamin D, and we will refill for 1 month.  - Vitamin D, Ergocalciferol, (DRISDOL) 1.25 MG (50000 UNIT) CAPS capsule; Take 1 capsule (50,000 Units total) by mouth every 7 (seven) days.  Dispense: 4 capsule; Refill: 0  2. Emotional Eating Behavior Alex [name] will continue Topamax, and we will refill for 1 month.  - topiramate (TOPAMAX) 50 MG tablet; Take 1 tablet (50 mg total) by mouth daily.  Dispense: 30 tablet; Refill: 0  3. BMI 45.0-49.9, adult (Alex [name])  4. Obesity, Beginning BMI 44.62 Alex [name] is currently in the action stage of change. As such, Alex [name] goal is to continue with weight loss efforts. Alex [name] has  agreed to keeping a food journal and adhering to recommended goals of 1200-1500 calories and 85+ grams of protein daily.   Exercise goals: As is.   Behavioral modification strategies: increasing lean protein intake, meal planning and cooking strategies, and keeping a strict food journal.  Alex [name] has agreed to follow-up with our clinic in 4 weeks. Alex [name] was informed of the importance of frequent follow-up visits to maximize Alex [name] success with intensive lifestyle modifications for Alex [name] multiple health conditions.   Objective:   Blood pressure 114/71, pulse 82, temperature 97.9 F (36.6 C), height 5\' 11"  (1.803 m), weight (!) 327 lb (148.3 kg). Body mass index is 45.61 kg/m.  Lab Results  Component Value Date   CREATININE 0.87 09/15/2022   BUN 17 09/15/2022   NA 141 09/15/2022   K 4.3 09/15/2022   CL 104 09/15/2022   CO2 21 09/15/2022   Lab Results  Component Value Date   ALT 31 09/15/2022   AST 24 09/15/2022   ALKPHOS 79 09/15/2022   BILITOT 0.5 09/15/2022   Lab Results  Component Value Date   HGBA1C 5.1 09/15/2022   HGBA1C 4.9 03/17/2022   HGBA1C 5.0 11/26/2021   HGBA1C 5.0 06/25/2021   Lab Results  Component Value Date   INSULIN 5.9 09/15/2022   INSULIN 10.0 03/17/2022   INSULIN 10.1 11/26/2021   INSULIN 6.5 06/25/2021   Lab Results  Component Value Date   TSH 2.910 11/26/2021   Lab Results  Component Value Date  CHOL 184 09/15/2022   HDL 58 09/15/2022   LDLCALC 115 (H) 09/15/2022   TRIG 56 09/15/2022   CHOLHDL 3.2 03/07/2019   Lab Results  Component Value Date   VD25OH 44.3 09/15/2022   VD25OH 59.7 03/17/2022   VD25OH 49.8 11/26/2021   Lab Results  Component Value Date   WBC 6.4 03/17/2022   HGB 15.3 03/17/2022   HCT 45.7 03/17/2022   MCV 88 03/17/2022   PLT 200 03/17/2022   Lab Results  Component Value Date   IRON 61 11/26/2021   TIBC 373 11/26/2021   FERRITIN 29 (L) 11/26/2021   Attestation  Statements:   Reviewed by clinician on day of visit: allergies, medications, problem list, medical history, surgical history, family history, social history, and previous encounter notes.   I, Trixie Dredge, am acting as transcriptionist for [name], MD.  I have reviewed the above documentation for accuracy and completeness, and I agree with the above. -  [name], MD

## 2023-01-15 ENCOUNTER — Other Ambulatory Visit (HOSPITAL_COMMUNITY): Payer: Self-pay

## 2023-02-02 ENCOUNTER — Other Ambulatory Visit (HOSPITAL_COMMUNITY): Payer: Self-pay

## 2023-02-02 ENCOUNTER — Ambulatory Visit (INDEPENDENT_AMBULATORY_CARE_PROVIDER_SITE_OTHER): Payer: 59 | Admitting: Family Medicine

## 2023-02-02 ENCOUNTER — Encounter (INDEPENDENT_AMBULATORY_CARE_PROVIDER_SITE_OTHER): Payer: Self-pay | Admitting: Family Medicine

## 2023-02-02 VITALS — BP 123/66 | HR 78 | Temp 98.1°F | Ht 71.0 in | Wt 329.0 lb

## 2023-02-02 DIAGNOSIS — F418 Other specified anxiety disorders: Secondary | ICD-10-CM | POA: Diagnosis not present

## 2023-02-02 DIAGNOSIS — E669 Obesity, unspecified: Secondary | ICD-10-CM

## 2023-02-02 DIAGNOSIS — I10 Essential (primary) hypertension: Secondary | ICD-10-CM | POA: Diagnosis not present

## 2023-02-02 DIAGNOSIS — F411 Generalized anxiety disorder: Secondary | ICD-10-CM | POA: Diagnosis not present

## 2023-02-02 DIAGNOSIS — R400 Somnolence: Secondary | ICD-10-CM

## 2023-02-02 DIAGNOSIS — E559 Vitamin D deficiency, unspecified: Secondary | ICD-10-CM

## 2023-02-02 DIAGNOSIS — F331 Major depressive disorder, recurrent, moderate: Secondary | ICD-10-CM | POA: Diagnosis not present

## 2023-02-02 DIAGNOSIS — Z6841 Body Mass Index (BMI) 40.0 and over, adult: Secondary | ICD-10-CM | POA: Diagnosis not present

## 2023-02-02 MED ORDER — VITAMIN D (ERGOCALCIFEROL) 1.25 MG (50000 UNIT) PO CAPS
50000.0000 [IU] | ORAL_CAPSULE | ORAL | 0 refills | Status: DC
  Filled 2023-02-02: qty 4, 28d supply, fill #0

## 2023-02-02 MED ORDER — TOPIRAMATE 100 MG PO TABS
100.0000 mg | ORAL_TABLET | Freq: Every day | ORAL | 0 refills | Status: DC
  Filled 2023-02-02: qty 30, 30d supply, fill #0

## 2023-02-03 NOTE — Progress Notes (Signed)
Chief Complaint:   OBESITY Alex [name] is here to discuss Alex [name] progress with Alex [name] obesity treatment plan along with follow-up of Alex [name] obesity related diagnoses. Alex [name] is on keeping a food journal and adhering to recommended goals of 1200-1500 calories and 85+ grams of protein and states Alex [name] is following Alex [name] eating plan approximately 75% of the time. Alex [name] states Alex [name] is doing 0 minutes 0 times per week.  Today's visit was #: 28 Starting weight: 311 lbs Starting date: 06/25/2021 Today's weight: 329 lbs Today's date: 02/02/2023 Total lbs lost to date: 0 Total lbs lost since last in-office visit: 0  Interim History: Alex [name] is struggling with weight loss. He notes it is difficult when he falls off his eating plan to get back on track. He has a tendency to have all or nothing behaviors.   Subjective:   1. White coat syndrome with diagnosis of hypertension Alex [name] notes white coat hypertension often at his PCP, but less so in our office. He also notes using a smaller cuff makes his blood pressure increase.   2. Vitamin D deficiency Alex [name]'s Vitamin D level has dropped below goal over the Winter. He denies side effects with Vitamin D prescription.   3. Daytime somnolence Alex [name] notes increased daytime sleepiness. He wakes up frequently. He is negative for obstructive sleep apnea after sleep study.   4. Emotional Eating Behavior Alex [name] feels his Topamax is helping him decrease emotional eating behavior. No side effects were noted. He has no history of kidney stones. He is still struggling with weight loss however.   Assessment/Plan:   1. White coat syndrome with diagnosis of hypertension We will continue to monitor, and check Alex [name]'s blood pressure with an XL cuff.   2. Vitamin D deficiency We will refill prescription Vitamin D, and we will recheck labs in 1 month.   - Vitamin D, Ergocalciferol,  (DRISDOL) 1.25 MG (50000 UNIT) CAPS capsule; Take 1 capsule (50,000 Units total) by mouth every 7 (seven) days.  Dispense: 4 capsule; Refill: 0  3. Daytime somnolence Alex [name] was given sleep suggestions and will see if this improves with adjusting Topamax dose. We will follow-up in 1 month.   4. Emotional Eating Behavior Alex [name] agreed to increase Topamax to 100 mg qhs, and we will refill for 1 month.   - topiramate (TOPAMAX) 100 MG tablet; Take 1 tablet (100 mg total) by mouth daily.  Dispense: 30 tablet; Refill: 0  5. BMI 45.0-49.9, adult  6. Obesity, Beginning BMI 44.62 Alex [name] is currently in the action stage of change. As such, Alex [name] goal is to continue with weight loss efforts. Alex [name] has agreed to keeping a food journal and adhering to recommended goals of 1200-1500 calories and 85+ grams of protein daily.   Strategies to help him avoid all or nothing behaviors, and help him find comfortable in-between strategies to help him get back on track with his eating plan.   Behavioral modification strategies: meal planning and cooking strategies and avoiding temptations.  Alex [name] has agreed to follow-up with our clinic in 4 weeks. Alex [name] was informed of the importance of frequent follow-up visits to maximize Alex [name] success with intensive lifestyle modifications for Alex [name] multiple health conditions.   Objective:   Blood pressure 123/66, pulse 78, temperature 98.1 F (36.7 C), height 5\' 11"  (1.803 m), weight (!) 329 lb (149.2 kg), SpO2 98 %. Body mass  index is 45.89 kg/m.  Lab Results  Component Value Date   CREATININE 0.87 09/15/2022   BUN 17 09/15/2022   NA 141 09/15/2022   K 4.3 09/15/2022   CL 104 09/15/2022   CO2 21 09/15/2022   Lab Results  Component Value Date   ALT 31 09/15/2022   AST 24 09/15/2022   ALKPHOS 79 09/15/2022   BILITOT 0.5 09/15/2022   Lab Results  Component Value Date   HGBA1C 5.1 09/15/2022    HGBA1C 4.9 03/17/2022   HGBA1C 5.0 11/26/2021   HGBA1C 5.0 06/25/2021   Lab Results  Component Value Date   INSULIN 5.9 09/15/2022   INSULIN 10.0 03/17/2022   INSULIN 10.1 11/26/2021   INSULIN 6.5 06/25/2021   Lab Results  Component Value Date   TSH 2.910 11/26/2021   Lab Results  Component Value Date   CHOL 184 09/15/2022   HDL 58 09/15/2022   LDLCALC 115 (H) 09/15/2022   TRIG 56 09/15/2022   CHOLHDL 3.2 03/07/2019   Lab Results  Component Value Date   VD25OH 44.3 09/15/2022   VD25OH 59.7 03/17/2022   VD25OH 49.8 11/26/2021   Lab Results  Component Value Date   WBC 6.4 03/17/2022   HGB 15.3 03/17/2022   HCT 45.7 03/17/2022   MCV 88 03/17/2022   PLT 200 03/17/2022   Lab Results  Component Value Date   IRON 61 11/26/2021   TIBC 373 11/26/2021   FERRITIN 29 (L) 11/26/2021   Attestation Statements:   Reviewed by clinician on day of visit: allergies, medications, problem list, medical history, surgical history, family history, social history, and previous encounter notes.  I have personally spent 40 minutes total time today in preparation, patient care, and documentation for this visit, including the following: review of clinical lab tests; review of medical tests/procedures/services.   I, Alex [name], am acting as transcriptionist for Alex Quince, MD.  I have reviewed the above documentation for accuracy and completeness, and I agree with the above. -  Alex Quince, MD

## 2023-02-24 ENCOUNTER — Other Ambulatory Visit (HOSPITAL_COMMUNITY): Payer: Self-pay

## 2023-02-24 DIAGNOSIS — R197 Diarrhea, unspecified: Secondary | ICD-10-CM | POA: Diagnosis not present

## 2023-02-24 DIAGNOSIS — R1032 Left lower quadrant pain: Secondary | ICD-10-CM | POA: Diagnosis not present

## 2023-02-24 DIAGNOSIS — K625 Hemorrhage of anus and rectum: Secondary | ICD-10-CM | POA: Diagnosis not present

## 2023-02-24 MED ORDER — CLENPIQ 10-3.5-12 MG-GM -GM/175ML PO SOLN
ORAL | 0 refills | Status: DC
  Filled 2023-02-24: qty 350, 1d supply, fill #0

## 2023-02-25 ENCOUNTER — Other Ambulatory Visit: Payer: Self-pay

## 2023-02-25 ENCOUNTER — Other Ambulatory Visit (HOSPITAL_COMMUNITY): Payer: Self-pay

## 2023-02-25 ENCOUNTER — Other Ambulatory Visit: Payer: Self-pay | Admitting: Internal Medicine

## 2023-02-25 DIAGNOSIS — J301 Allergic rhinitis due to pollen: Secondary | ICD-10-CM

## 2023-02-25 DIAGNOSIS — R0602 Shortness of breath: Secondary | ICD-10-CM

## 2023-02-25 DIAGNOSIS — J4530 Mild persistent asthma, uncomplicated: Secondary | ICD-10-CM

## 2023-02-25 DIAGNOSIS — F411 Generalized anxiety disorder: Secondary | ICD-10-CM | POA: Diagnosis not present

## 2023-02-25 DIAGNOSIS — F331 Major depressive disorder, recurrent, moderate: Secondary | ICD-10-CM | POA: Diagnosis not present

## 2023-02-25 MED ORDER — DULOXETINE HCL 60 MG PO CPEP
60.0000 mg | ORAL_CAPSULE | Freq: Every day | ORAL | 0 refills | Status: DC
  Filled 2023-02-25: qty 90, 90d supply, fill #0

## 2023-02-25 MED ORDER — FLUTICASONE PROPIONATE HFA 110 MCG/ACT IN AERO
1.0000 | INHALATION_SPRAY | Freq: Two times a day (BID) | RESPIRATORY_TRACT | 0 refills | Status: DC
Start: 2023-02-25 — End: 2023-05-18
  Filled 2023-02-25: qty 12, 30d supply, fill #0

## 2023-02-25 MED ORDER — BUSPIRONE HCL 15 MG PO TABS
15.0000 mg | ORAL_TABLET | Freq: Three times a day (TID) | ORAL | 0 refills | Status: DC
  Filled 2023-02-25: qty 270, 90d supply, fill #0

## 2023-03-02 ENCOUNTER — Ambulatory Visit (INDEPENDENT_AMBULATORY_CARE_PROVIDER_SITE_OTHER): Payer: 59 | Admitting: Family Medicine

## 2023-03-02 DIAGNOSIS — F331 Major depressive disorder, recurrent, moderate: Secondary | ICD-10-CM | POA: Diagnosis not present

## 2023-03-02 DIAGNOSIS — F411 Generalized anxiety disorder: Secondary | ICD-10-CM | POA: Diagnosis not present

## 2023-03-09 ENCOUNTER — Encounter: Payer: Self-pay | Admitting: Physician Assistant

## 2023-03-09 DIAGNOSIS — K635 Polyp of colon: Secondary | ICD-10-CM | POA: Diagnosis not present

## 2023-03-09 DIAGNOSIS — K648 Other hemorrhoids: Secondary | ICD-10-CM | POA: Diagnosis not present

## 2023-03-09 DIAGNOSIS — R197 Diarrhea, unspecified: Secondary | ICD-10-CM | POA: Diagnosis not present

## 2023-03-09 DIAGNOSIS — K5140 Inflammatory polyps of colon without complications: Secondary | ICD-10-CM | POA: Diagnosis not present

## 2023-03-09 DIAGNOSIS — K625 Hemorrhage of anus and rectum: Secondary | ICD-10-CM | POA: Diagnosis not present

## 2023-03-09 DIAGNOSIS — K921 Melena: Secondary | ICD-10-CM | POA: Diagnosis not present

## 2023-03-09 LAB — HM COLONOSCOPY

## 2023-03-10 ENCOUNTER — Other Ambulatory Visit (HOSPITAL_COMMUNITY): Payer: Self-pay

## 2023-03-10 MED ORDER — HYDROCORT-PRAMOXINE (PERIANAL) 2.5-1 % EX CREA
1.0000 | TOPICAL_CREAM | Freq: Three times a day (TID) | CUTANEOUS | 3 refills | Status: AC
  Filled 2023-03-10: qty 30, 10d supply, fill #0

## 2023-03-11 ENCOUNTER — Other Ambulatory Visit (HOSPITAL_COMMUNITY): Payer: Self-pay

## 2023-03-16 ENCOUNTER — Encounter (INDEPENDENT_AMBULATORY_CARE_PROVIDER_SITE_OTHER): Payer: Self-pay | Admitting: Family Medicine

## 2023-03-16 ENCOUNTER — Ambulatory Visit (INDEPENDENT_AMBULATORY_CARE_PROVIDER_SITE_OTHER): Payer: 59 | Admitting: Family Medicine

## 2023-03-16 ENCOUNTER — Other Ambulatory Visit (HOSPITAL_COMMUNITY): Payer: Self-pay

## 2023-03-16 VITALS — BP 118/79 | HR 71 | Temp 97.9°F | Ht 71.0 in | Wt 328.0 lb

## 2023-03-16 DIAGNOSIS — E669 Obesity, unspecified: Secondary | ICD-10-CM

## 2023-03-16 DIAGNOSIS — E559 Vitamin D deficiency, unspecified: Secondary | ICD-10-CM

## 2023-03-16 DIAGNOSIS — Z6841 Body Mass Index (BMI) 40.0 and over, adult: Secondary | ICD-10-CM

## 2023-03-16 DIAGNOSIS — K5909 Other constipation: Secondary | ICD-10-CM | POA: Diagnosis not present

## 2023-03-16 DIAGNOSIS — F418 Other specified anxiety disorders: Secondary | ICD-10-CM | POA: Diagnosis not present

## 2023-03-16 MED ORDER — VITAMIN D (ERGOCALCIFEROL) 1.25 MG (50000 UNIT) PO CAPS
50000.0000 [IU] | ORAL_CAPSULE | ORAL | 0 refills | Status: DC
Start: 2023-03-16 — End: 2023-03-30
  Filled 2023-03-16: qty 4, 28d supply, fill #0

## 2023-03-16 MED ORDER — TOPIRAMATE 100 MG PO TABS
100.0000 mg | ORAL_TABLET | Freq: Every day | ORAL | 0 refills | Status: DC
Start: 2023-03-16 — End: 2023-05-25
  Filled 2023-03-16: qty 30, 30d supply, fill #0

## 2023-03-16 NOTE — Progress Notes (Signed)
Chief Complaint:   OBESITY Alex [name] is here to discuss Alex [name] progress with Alex [name] obesity treatment plan along with follow-up of Alex [name] obesity related diagnoses. Alex [name] is on keeping a food journal and adhering to recommended goals of 1200-1500 calories and 85+ grams of protein and states Alex [name] is following Alex [name] eating plan approximately 70% of the time. Alex [name] states Alex [name] is doing cardio and walking for 30-45 minutes 2-3 times per week.  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  He has tried to increase exercise, both cardio and strengthening.  Subjective:   1. Other constipation Alex [name] is struggling with constipation, and some abdominal pain and cramping with occasional bouts of diarrhea.  2. Vitamin D deficiency Alex [name] is on vitamin D, and his level is almost at goal. He denies nausea, vomiting, or muscle weakness.   3. Emotional Eating Behavior Alex [name] is working on decreasing emotional eating behaviors. He is doing well on Topamax overall.   Assessment/Plan:   1. Other constipation Alex [name] will follow-up with GI, and he was recommended to start MiraLAX 17 g daily and increase his water intake.  2. Vitamin D deficiency Alex [name] will continue prescription vitamin D, and we will refill for 1 month.  - Vitamin D, Ergocalciferol, (DRISDOL) 1.25 MG (50000 UNIT) CAPS capsule; Take 1 capsule (50,000 Units total) by mouth every 7 (seven) days.  Dispense: 4 capsule; Refill: 0  3. Emotional Eating Behavior Alex [name] will continue Topamax, and we will refill for 1 month.  - topiramate (TOPAMAX) 100 MG tablet; Take 1 tablet (100 mg total) by mouth daily.  Dispense:  30 tablet; Refill: 0  4. BMI 45.0-49.9, adult ([facility])  5. Obesity, Beginning BMI 44.62 Alex [name] is currently in the action stage of change. As such, Alex [name] goal is to continue with weight loss efforts. Alex [name] has agreed to keeping a food journal and adhering to recommended goals of 1200-1500 calories and 85+ grams of protein daily.   Exercise goals: As is.   Behavioral modification strategies: increasing lean protein intake.  Alex [name] has agreed to follow-up with our clinic in 4 weeks. Alex [name] was informed of the importance of frequent follow-up visits to maximize Alex [name] success with intensive lifestyle modifications for Alex [name] multiple health conditions.   Objective:   Blood pressure 118/79, pulse 71, temperature 97.9 F (36.6 C), height 5\' 11"  (1.803 m), weight (!) 328 lb (148.8 kg), SpO2 98 %. Body mass index is 45.75 kg/m.  Lab Results  Component Value Date   CREATININE 0.87 09/15/2022   BUN 17 09/15/2022   NA 141 09/15/2022   K 4.3 09/15/2022   CL 104 09/15/2022   CO2 21 09/15/2022   Lab Results  Component Value Date   ALT 31 09/15/2022   AST 24 09/15/2022   ALKPHOS 79 09/15/2022   BILITOT 0.5 09/15/2022   Lab Results  Component Value Date   HGBA1C 5.1 09/15/2022   HGBA1C 4.9 03/17/2022   HGBA1C 5.0 11/26/2021   HGBA1C 5.0 06/25/2021   Lab Results  Component Value Date   INSULIN 5.9 09/15/2022   INSULIN 10.0 03/17/2022   INSULIN 10.1 11/26/2021   INSULIN  6.5 06/25/2021   Lab Results  Component Value Date   TSH 2.910 11/26/2021   Lab Results  Component Value Date   CHOL 184 09/15/2022   HDL 58 09/15/2022   LDLCALC 115 (H) 09/15/2022   TRIG 56 09/15/2022   CHOLHDL 3.2 03/07/2019   Lab Results  Component Value Date   VD25OH 44.3 09/15/2022   VD25OH 59.7 03/17/2022   VD25OH 49.8 11/26/2021   Lab Results  Component Value Date   WBC 6.4 03/17/2022   HGB 15.3 03/17/2022   HCT 45.7 03/17/2022   MCV 88  03/17/2022   PLT 200 03/17/2022   Lab Results  Component Value Date   IRON 61 11/26/2021   TIBC 373 11/26/2021   FERRITIN 29 (L) 11/26/2021   Attestation Statements:   Reviewed by clinician on day of visit: allergies, medications, problem list, medical history, surgical history, family history, social history, and previous encounter notes.   I, Burt Knack, am acting as transcriptionist for Quillian Quince, MD.  I have reviewed the above documentation for accuracy and completeness, and I agree with the above. -  Quillian Quince, MD

## 2023-03-30 ENCOUNTER — Encounter (INDEPENDENT_AMBULATORY_CARE_PROVIDER_SITE_OTHER): Payer: Self-pay | Admitting: Family Medicine

## 2023-03-30 ENCOUNTER — Other Ambulatory Visit (HOSPITAL_COMMUNITY): Payer: Self-pay

## 2023-03-30 ENCOUNTER — Ambulatory Visit (INDEPENDENT_AMBULATORY_CARE_PROVIDER_SITE_OTHER): Payer: 59 | Admitting: Family Medicine

## 2023-03-30 VITALS — BP 116/79 | HR 76 | Temp 98.1°F | Ht 71.0 in | Wt 327.0 lb

## 2023-03-30 DIAGNOSIS — R4689 Other symptoms and signs involving appearance and behavior: Secondary | ICD-10-CM

## 2023-03-30 DIAGNOSIS — E559 Vitamin D deficiency, unspecified: Secondary | ICD-10-CM | POA: Diagnosis not present

## 2023-03-30 DIAGNOSIS — E669 Obesity, unspecified: Secondary | ICD-10-CM | POA: Diagnosis not present

## 2023-03-30 DIAGNOSIS — Z6841 Body Mass Index (BMI) 40.0 and over, adult: Secondary | ICD-10-CM | POA: Diagnosis not present

## 2023-03-30 DIAGNOSIS — F418 Other specified anxiety disorders: Secondary | ICD-10-CM

## 2023-03-30 MED ORDER — VITAMIN D (ERGOCALCIFEROL) 1.25 MG (50000 UNIT) PO CAPS
50000.0000 [IU] | ORAL_CAPSULE | ORAL | 0 refills | Status: DC
Start: 2023-03-30 — End: 2023-04-27
  Filled 2023-03-30: qty 4, 28d supply, fill #0

## 2023-03-30 NOTE — Progress Notes (Unsigned)
Chief Complaint:   OBESITY Alex [name] is here to discuss Alex [name]. Alex [name] is on keeping a food journal and adhering to recommended goals of 1200-1500 calories and 85+ grams of protein and states Alex [name] is following Alex [name] eating plan approximately 80% of the time. Alex [name] states Alex [name] is lifting weights for 60 minutes 3 times per week.  Alex [name]  Interim History: Patient is doing better with journaling and he feels he is doing better with decreasing snacking. He notes eating a later lunch. He has increased lifting weights.   Subjective:   [name]. Vitamin D deficiency Patient's Vitamin D is improving, but not yet at goal. He denies nausea, vomiting, or muscle weakness.   2. Emotional Eating Behavior Patient feels he is doing better with decreasing emotional eating behaviors. He stopped his Topamax as he feels he no longer needs it. He has no problems with this.   Assessment/Plan:   [name]. Vitamin D deficiency We will refill prescription Vitamin D for [name] month.  We will recheck labs in [name]-2 months.   - Vitamin D, Ergocalciferol, (DRISDOL) [name].25 MG (50000 UNIT) CAPS capsule; Take [name] capsule (50,000 Units total) by mouth every 7 (seven) days.  Dispense: 4 capsule; Refill: 0  2. Emotional Eating Behavior Patient agreed to discontinue Topamax, and we will follow-up.   3. BMI 45.0-49.9, adult (HCC)  4. Obesity, with starting BMI 44.62 Alex [name]. Alex [name] has agreed to keeping a food journal and adhering to recommended goals of 1200-1500  calories and 85+ grams of protein daily.   Exercise goals: As is.   Behavioral modification strategies: increasing vegetables.  Alex [name]. Alex [name] was informed of the importance of frequent follow-up visits to maximize Alex [name] KRUKOWSKI success with intensive lifestyle modifications for [name] multiple health conditions.   Objective:   Blood pressure 116/79, pulse 76, temperature 98.[name] F (36.7 C), height 5\' 11"  ([name].803 m), weight (!) 327 lb (148.3 kg), SpO2 98 %. Body mass index is 45.61 kg/m.  Lab Results  Component Value Date   CREATININE 0.87 09/15/2022   BUN 17 09/15/2022   NA 141 09/15/2022   K 4.3 09/15/2022   CL 104 09/15/2022   CO2 21 09/15/2022   Lab Results  Component Value Date   ALT 31 09/15/2022   AST 24 09/15/2022   ALKPHOS 79 09/15/2022   BILITOT 0.5 09/15/2022   Lab Results  Component Value Date   HGBA1C 5.[name] 09/15/2022   HGBA1C 4.9 03/17/2022   HGBA1C 5.0 11/26/2021   HGBA1C 5.0 06/25/2021   Lab Results  Component Value Date   INSULIN 5.9 09/15/2022   INSULIN 10.0 03/17/2022   INSULIN 10.[name] 11/26/2021   INSULIN 6.5 06/25/2021   Lab Results  Component Value Date   TSH 2.910 11/26/2021   Lab Results  Component Value Date   CHOL 184 09/15/2022   HDL 58 09/15/2022   LDLCALC 115 (H) 09/15/2022   TRIG 56 09/15/2022   CHOLHDL 3.2 03/07/2019  Lab Results  Component Value Date   VD25OH 44.3 09/15/2022   VD25OH 59.7 03/17/2022   VD25OH 49.8 11/26/2021   Lab Results  Component Value Date   WBC 6.4 03/17/2022   HGB 15.3 03/17/2022   HCT 45.7 03/17/2022   MCV 88 03/17/2022   PLT 200 03/17/2022   Lab Results  Component Value Date   IRON 61 11/26/2021   TIBC 373 11/26/2021   FERRITIN 29 (L) 11/26/2021   Attestation Statements:   Reviewed by clinician on day of [name]: allergies, medications, problem list, medical history, surgical history, family history, social history,  and previous encounter notes.   I, Burt Knack, am acting as transcriptionist for Quillian Quince, MD.  I have reviewed the above documentation for accuracy and completeness, and I agree with the above. -  Quillian Quince, MD

## 2023-03-31 ENCOUNTER — Telehealth: Payer: 59 | Admitting: Nurse Practitioner

## 2023-03-31 DIAGNOSIS — F411 Generalized anxiety disorder: Secondary | ICD-10-CM | POA: Diagnosis not present

## 2023-03-31 DIAGNOSIS — F331 Major depressive disorder, recurrent, moderate: Secondary | ICD-10-CM | POA: Diagnosis not present

## 2023-03-31 DIAGNOSIS — J0140 Acute pansinusitis, unspecified: Secondary | ICD-10-CM

## 2023-03-31 MED ORDER — AMOXICILLIN-POT CLAVULANATE 875-125 MG PO TABS
1.0000 | ORAL_TABLET | Freq: Two times a day (BID) | ORAL | 0 refills | Status: AC
Start: 2023-03-31 — End: 2023-04-08
  Filled 2023-03-31: qty 14, 7d supply, fill #0

## 2023-03-31 NOTE — Progress Notes (Signed)

## 2023-04-01 ENCOUNTER — Other Ambulatory Visit (HOSPITAL_COMMUNITY): Payer: Self-pay

## 2023-04-21 DIAGNOSIS — K625 Hemorrhage of anus and rectum: Secondary | ICD-10-CM | POA: Diagnosis not present

## 2023-04-21 DIAGNOSIS — F411 Generalized anxiety disorder: Secondary | ICD-10-CM | POA: Diagnosis not present

## 2023-04-21 DIAGNOSIS — F331 Major depressive disorder, recurrent, moderate: Secondary | ICD-10-CM | POA: Diagnosis not present

## 2023-04-21 DIAGNOSIS — K640 First degree hemorrhoids: Secondary | ICD-10-CM | POA: Diagnosis not present

## 2023-04-27 ENCOUNTER — Encounter (INDEPENDENT_AMBULATORY_CARE_PROVIDER_SITE_OTHER): Payer: Self-pay | Admitting: Family Medicine

## 2023-04-27 ENCOUNTER — Other Ambulatory Visit (HOSPITAL_COMMUNITY): Payer: Self-pay

## 2023-04-27 ENCOUNTER — Ambulatory Visit (INDEPENDENT_AMBULATORY_CARE_PROVIDER_SITE_OTHER): Payer: 59 | Admitting: Family Medicine

## 2023-04-27 VITALS — BP 104/71 | HR 70 | Ht 71.0 in | Wt 332.0 lb

## 2023-04-27 DIAGNOSIS — K5900 Constipation, unspecified: Secondary | ICD-10-CM | POA: Diagnosis not present

## 2023-04-27 DIAGNOSIS — E559 Vitamin D deficiency, unspecified: Secondary | ICD-10-CM | POA: Diagnosis not present

## 2023-04-27 DIAGNOSIS — F419 Anxiety disorder, unspecified: Secondary | ICD-10-CM | POA: Diagnosis not present

## 2023-04-27 DIAGNOSIS — Z6841 Body Mass Index (BMI) 40.0 and over, adult: Secondary | ICD-10-CM

## 2023-04-27 DIAGNOSIS — E669 Obesity, unspecified: Secondary | ICD-10-CM

## 2023-04-27 MED ORDER — VITAMIN D (ERGOCALCIFEROL) 1.25 MG (50000 UNIT) PO CAPS
50000.0000 [IU] | ORAL_CAPSULE | ORAL | 0 refills | Status: DC
Start: 2023-04-27 — End: 2023-06-22
  Filled 2023-04-27: qty 4, 28d supply, fill #0

## 2023-04-27 NOTE — Progress Notes (Signed)
.[facility]  Office: [phone]  /  Fax: [phone]  WEIGHT SUMMARY AND BIOMETRICS  Anthropometric Measurements Height: 5\' 11"  (1.803 m) Weight: (!) 332 lb (150.6 kg) BMI (Calculated): 46.33 Weight at Last Visit: 327 lb Weight Lost Since Last Visit: 0 Weight Gained Since Last Visit: 5 lb Starting Weight: 311 lb Total Weight Loss (lbs): 0 lb (0 kg)   Body Composition  Body Fat %: 38.6 % Fat Mass (lbs): 128.2 lbs Muscle Mass (lbs): 194 lbs Total Body Water (lbs): 137 lbs Visceral Fat Rating : 24   Other Clinical Data Fasting: No Labs: No Today's Visit #: 31    Chief Complaint: OBESITY   Discussed the use of AI scribe software for clinical note transcription with the patient, who gave verbal consent to proceed.  History of Present Illness   The patient, a 42 year old individual with obesity and vitamin D deficiency, presents today for a follow-up visit. He reports a weight gain of five pounds over the past month despite maintaining a food journal and aiming for a daily intake of 1200-1500 calories with 85 grams of protein. He admits to adhering to this dietary plan only about 50% of the time. He also reports engaging in strength training and cardio exercises for 30-45 minutes three times a week.  The patient also discusses his struggle with sleep, describing vivid dreams and feeling tired despite adequate sleep. He denies any recent changes in lifestyle or habits that could affect his sleep quality. He also mentions recent yard work, which he considers a form of exercise.  Regarding his diet, the patient admits to some difficulties with meal planning and preparation, particularly during a recent beach trip. However, he reports being able to get back on track with his diet upon returning home. He also expresses frustration with his current situation and is considering returning to a previous dietary plan.  In addition to his struggles with obesity, the patient also discusses his  vitamin D deficiency and requests a refill for his medication. He also mentions ongoing treatment for anxiety with Cymbalta and Buspar, and occasional use of hydroxyzine as a 'parachute pill' for panic attacks, although he hasn't needed it recently. He also mentions starting Miralax for constipation.  The patient is also in therapy and is awaiting a referral for ADHD evaluation, as his new therapist suspects that his symptoms may be more related to ADHD than anxiety. He expresses frustration with the healthcare system and the delays in getting the necessary care.          PHYSICAL EXAM:  Blood pressure 104/71, pulse 70, height 5\' 11"  (1.803 m), weight (!) 332 lb (150.6 kg), SpO2 97 %. Body mass index is 46.3 kg/m.  DIAGNOSTIC DATA REVIEWED:  BMET    Component Value Date/Time   NA 141 09/15/2022 1025   K 4.3 09/15/2022 1025   CL 104 09/15/2022 1025   CO2 21 09/15/2022 1025   GLUCOSE 84 09/15/2022 1025   GLUCOSE 100 (H) 06/05/2021 1542   BUN 17 09/15/2022 1025   CREATININE 0.87 09/15/2022 1025   CALCIUM 9.0 09/15/2022 1025   GFRNONAA >60 06/05/2021 1542   GFRAA 119 03/19/2020 0913   Lab Results  Component Value Date   HGBA1C 5.1 09/15/2022   HGBA1C 5.0 06/25/2021   Lab Results  Component Value Date   INSULIN 5.9 09/15/2022   INSULIN 6.5 06/25/2021   Lab Results  Component Value Date   TSH 2.910 11/26/2021   CBC    Component Value Date/Time  WBC 6.4 03/17/2022 0940   WBC 7.0 06/05/2021 1542   RBC 5.20 03/17/2022 0940   RBC 4.91 06/05/2021 1542   HGB 15.3 03/17/2022 0940   HCT 45.7 03/17/2022 0940   PLT 200 03/17/2022 0940   MCV 88 03/17/2022 0940   MCH 29.4 03/17/2022 0940   MCH 27.7 06/05/2021 1542   MCHC 33.5 03/17/2022 0940   MCHC 32.1 06/05/2021 1542   RDW 12.7 03/17/2022 0940   Iron Studies    Component Value Date/Time   IRON 61 11/26/2021 0832   TIBC 373 11/26/2021 0832   FERRITIN 29 (L) 11/26/2021 0832   IRONPCTSAT 16 11/26/2021 0832   Lipid  Panel     Component Value Date/Time   CHOL 184 09/15/2022 1025   TRIG 56 09/15/2022 1025   HDL 58 09/15/2022 1025   CHOLHDL 3.2 03/07/2019 0927   LDLCALC 115 (H) 09/15/2022 1025   Hepatic Function Panel     Component Value Date/Time   PROT 6.7 09/15/2022 1025   ALBUMIN 4.3 09/15/2022 1025   AST 24 09/15/2022 1025   ALT 31 09/15/2022 1025   ALKPHOS 79 09/15/2022 1025   BILITOT 0.5 09/15/2022 1025      Component Value Date/Time   TSH 2.910 11/26/2021 0832   Nutritional Lab Results  Component Value Date   VD25OH 44.3 09/15/2022   VD25OH 59.7 03/17/2022   VD25OH 49.8 11/26/2021     Assessment and Plan    Obesity: Weight gain of 5 pounds in the last month. Patient is keeping a food journal and aiming for 1200-1500 calories and 85+ grams of protein per day, achieving this approximately 50% of the time. Patient is also engaging in strength training and cardio exercise 3 times per week. Discussed the importance of consistent meal planning and preparation, and the challenges of convenience eating. Patient expressed frustration and plans to return to the category three plan. -Continue with food journaling and exercise regimen. -Return to category three plan for meal planning and preparation. -Check-in in 4 weeks to assess progress.  Vitamin D deficiency: Patient requested a refill for Vitamin D. -Refill Vitamin D prescription.  Anxiety: Patient is currently on Cymbalta and Buspar. Patient is working with a Therapist, sports and therapist to manage anxiety. There is a potential diagnosis of ADHD being explored. -Continue current medications. -Follow up with psychiatrist and therapist regarding potential ADHD diagnosis.  Constipation: Patient started taking Miralax daily. -Continue daily Miralax. -Ensure adequate hydration.  General Health Maintenance: -Continue to work on sleep quality and stress management. -Encouraged to reach out if needs arise before the next scheduled  appointment in 4 weeks.       JACOBB MOHIUDDIN was informed of the importance of frequent follow up visits to maximize Vibha Ferdig Hazy success with intensive lifestyle modifications for STEFEN JAQUAY multiple health conditions.    Quillian Quince, MD

## 2023-05-06 DIAGNOSIS — F331 Major depressive disorder, recurrent, moderate: Secondary | ICD-10-CM | POA: Diagnosis not present

## 2023-05-06 DIAGNOSIS — F411 Generalized anxiety disorder: Secondary | ICD-10-CM | POA: Diagnosis not present

## 2023-05-10 ENCOUNTER — Other Ambulatory Visit (HOSPITAL_COMMUNITY): Payer: Self-pay

## 2023-05-18 ENCOUNTER — Other Ambulatory Visit: Payer: Self-pay | Admitting: Internal Medicine

## 2023-05-18 DIAGNOSIS — J301 Allergic rhinitis due to pollen: Secondary | ICD-10-CM

## 2023-05-18 DIAGNOSIS — R0602 Shortness of breath: Secondary | ICD-10-CM

## 2023-05-18 DIAGNOSIS — F331 Major depressive disorder, recurrent, moderate: Secondary | ICD-10-CM | POA: Diagnosis not present

## 2023-05-18 DIAGNOSIS — J4530 Mild persistent asthma, uncomplicated: Secondary | ICD-10-CM

## 2023-05-18 DIAGNOSIS — F411 Generalized anxiety disorder: Secondary | ICD-10-CM | POA: Diagnosis not present

## 2023-05-19 ENCOUNTER — Other Ambulatory Visit (HOSPITAL_COMMUNITY): Payer: Self-pay

## 2023-05-19 MED ORDER — FLUTICASONE PROPIONATE HFA 110 MCG/ACT IN AERO
1.0000 | INHALATION_SPRAY | Freq: Two times a day (BID) | RESPIRATORY_TRACT | 0 refills | Status: DC
  Filled 2023-05-19: qty 12, 30d supply, fill #0

## 2023-05-20 DIAGNOSIS — F331 Major depressive disorder, recurrent, moderate: Secondary | ICD-10-CM | POA: Diagnosis not present

## 2023-05-20 DIAGNOSIS — F411 Generalized anxiety disorder: Secondary | ICD-10-CM | POA: Diagnosis not present

## 2023-05-24 DIAGNOSIS — F331 Major depressive disorder, recurrent, moderate: Secondary | ICD-10-CM | POA: Diagnosis not present

## 2023-05-24 DIAGNOSIS — F411 Generalized anxiety disorder: Secondary | ICD-10-CM | POA: Diagnosis not present

## 2023-05-25 ENCOUNTER — Encounter (INDEPENDENT_AMBULATORY_CARE_PROVIDER_SITE_OTHER): Payer: Self-pay | Admitting: Family Medicine

## 2023-05-25 ENCOUNTER — Ambulatory Visit (INDEPENDENT_AMBULATORY_CARE_PROVIDER_SITE_OTHER): Payer: 59 | Admitting: Family Medicine

## 2023-05-25 ENCOUNTER — Other Ambulatory Visit (HOSPITAL_COMMUNITY): Payer: Self-pay

## 2023-05-25 VITALS — BP 119/71 | HR 77 | Temp 98.0°F | Ht 71.0 in | Wt 335.0 lb

## 2023-05-25 DIAGNOSIS — Z6841 Body Mass Index (BMI) 40.0 and over, adult: Secondary | ICD-10-CM | POA: Diagnosis not present

## 2023-05-25 DIAGNOSIS — F331 Major depressive disorder, recurrent, moderate: Secondary | ICD-10-CM | POA: Diagnosis not present

## 2023-05-25 DIAGNOSIS — F418 Other specified anxiety disorders: Secondary | ICD-10-CM | POA: Diagnosis not present

## 2023-05-25 DIAGNOSIS — F411 Generalized anxiety disorder: Secondary | ICD-10-CM | POA: Diagnosis not present

## 2023-05-25 DIAGNOSIS — E88819 Insulin resistance, unspecified: Secondary | ICD-10-CM | POA: Diagnosis not present

## 2023-05-25 DIAGNOSIS — E669 Obesity, unspecified: Secondary | ICD-10-CM | POA: Diagnosis not present

## 2023-05-25 DIAGNOSIS — E559 Vitamin D deficiency, unspecified: Secondary | ICD-10-CM | POA: Diagnosis not present

## 2023-05-25 MED ORDER — VENLAFAXINE HCL ER 75 MG PO CP24
75.0000 mg | ORAL_CAPSULE | Freq: Every day | ORAL | 0 refills | Status: DC
  Filled 2023-05-25: qty 15, 15d supply, fill #0

## 2023-05-25 MED ORDER — DULOXETINE HCL 30 MG PO CPEP
30.0000 mg | ORAL_CAPSULE | Freq: Every day | ORAL | 0 refills | Status: DC
  Filled 2023-05-25: qty 7, 7d supply, fill #0

## 2023-05-25 MED ORDER — TOPIRAMATE 100 MG PO TABS
100.0000 mg | ORAL_TABLET | Freq: Every day | ORAL | 0 refills | Status: DC
Start: 2023-05-25 — End: 2023-06-22
  Filled 2023-05-25: qty 30, 30d supply, fill #0

## 2023-05-25 NOTE — Progress Notes (Signed)
Chief Complaint:   OBESITY Zyian is here to discuss Irvine's progress with Jaymin's obesity treatment plan along with follow-up of Yves's obesity related diagnoses. Arty is on keeping a food journal and adhering to recommended goals of 1200-1500 calories and 85+ grams of protein and states Yochanon is following Sami's eating plan approximately 20% of the time. Azra states Wylan is lifting weights for 45 minutes 2 times per week.  Today's visit was #: 32 Starting weight: 311 lbs Starting date: 06/25/2021 Today's weight: 335 lbs Today's date: 05/25/2023 Total lbs lost to date: 0 Total lbs lost since last in-office visit: 0  Interim History: Patient has been on vacation and he has struggled a bit with meal planning.  He is open to easier meal planning ideas.  Subjective:   1. Insulin resistance Patient is working on his diet, and he is due for labs soon.  2. Vitamin D deficiency Patient is on vitamin D, and he denies nausea, vomiting, or muscle weakness.  3. Emotional Eating Behavior Patient is stable on Topamax.  He is working on meal planning to help decrease emotional eating behavior.  No side effects were noted on his medications.  Assessment/Plan:   1. Insulin resistance We will check labs today, we will follow-up at patient's next visit.  - Insulin, random - Hemoglobin A1c - CMP14+EGFR  2. Vitamin D deficiency We will check labs today, and we will follow-up at his next visit.  Patient will continue his vitamin D.  - VITAMIN D 25 Hydroxy (Vit-D Deficiency, Fractures)  3. Emotional Eating Behavior Patient will continue Topamax 100 mg once daily, and we will refill for 1 month.  - topiramate (TOPAMAX) 100 MG tablet; Take 1 tablet (100 mg total) by mouth daily.  Dispense: 30 tablet; Refill: 0  4. BMI 45.0-49.9, adult (HCC)  5. Obesity, Beginning BMI 44.62 Armon is currently in the action stage of change. As such, Ardit's goal is to continue  with weight loss efforts. Savio has agreed to keeping a food journal and adhering to recommended goals of 1200-1500 calories and 85+ grams of protein daily.   Light Summer protein ideas was discussed.   Exercise goals: As is.   Behavioral modification strategies: increasing lean protein intake and meal planning and cooking strategies.  Jomar has agreed to follow-up with our clinic in 4 weeks. Zymire was informed of the importance of frequent follow-up visits to maximize Kristi's success with intensive lifestyle modifications for Caspian's multiple health conditions.   Jontez was informed we would discuss Ramey's lab results at James's next visit unless there is a critical issue that needs to be addressed sooner. Nyan agreed to keep Ryin's next visit at the agreed upon time to discuss these results.  Objective:   Blood pressure 119/71, pulse 77, temperature 98 F (36.7 C), height 5\' 11"  (1.803 m), weight (!) 335 lb (152 kg), SpO2 97%. Body mass index is 46.72 kg/m.  Lab Results  Component Value Date   CREATININE 0.87 09/15/2022   BUN 17 09/15/2022   NA 141 09/15/2022   K 4.3 09/15/2022   CL 104 09/15/2022   CO2 21 09/15/2022   Lab Results  Component Value Date   ALT 31 09/15/2022   AST 24 09/15/2022   ALKPHOS 79 09/15/2022   BILITOT 0.5 09/15/2022   Lab Results  Component Value Date   HGBA1C 5.1 09/15/2022   HGBA1C 4.9 03/17/2022   HGBA1C 5.0 11/26/2021   HGBA1C 5.0 06/25/2021   Lab Results  Component Value Date   INSULIN 5.9 09/15/2022   INSULIN 10.0 03/17/2022   INSULIN 10.1 11/26/2021   INSULIN 6.5 06/25/2021   Lab Results  Component Value Date   TSH 2.910 11/26/2021   Lab Results  Component Value Date   CHOL 184 09/15/2022   HDL 58 09/15/2022   LDLCALC 115 (H) 09/15/2022   TRIG 56 09/15/2022   CHOLHDL 3.2 03/07/2019   Lab Results  Component Value Date   VD25OH 44.3 09/15/2022   VD25OH 59.7 03/17/2022   VD25OH 49.8 11/26/2021   Lab  Results  Component Value Date   WBC 6.4 03/17/2022   HGB 15.3 03/17/2022   HCT 45.7 03/17/2022   MCV 88 03/17/2022   PLT 200 03/17/2022   Lab Results  Component Value Date   IRON 61 11/26/2021   TIBC 373 11/26/2021   FERRITIN 29 (L) 11/26/2021   Attestation Statements:   Reviewed by clinician on day of visit: allergies, medications, problem list, medical history, surgical history, family history, social history, and previous encounter notes.   I, Burt Knack, am acting as transcriptionist for Quillian Quince, MD.  I have reviewed the above documentation for accuracy and completeness, and I agree with the above. -  Quillian Quince, MD

## 2023-06-01 ENCOUNTER — Ambulatory Visit (INDEPENDENT_AMBULATORY_CARE_PROVIDER_SITE_OTHER): Payer: 59 | Admitting: Physician Assistant

## 2023-06-01 ENCOUNTER — Encounter: Payer: Self-pay | Admitting: Physician Assistant

## 2023-06-01 VITALS — BP 136/82 | HR 76 | Temp 97.3°F | Ht 71.0 in | Wt 339.2 lb

## 2023-06-01 DIAGNOSIS — E88819 Insulin resistance, unspecified: Secondary | ICD-10-CM | POA: Diagnosis not present

## 2023-06-01 DIAGNOSIS — E559 Vitamin D deficiency, unspecified: Secondary | ICD-10-CM | POA: Diagnosis not present

## 2023-06-01 DIAGNOSIS — Z0000 Encounter for general adult medical examination without abnormal findings: Secondary | ICD-10-CM | POA: Diagnosis not present

## 2023-06-01 DIAGNOSIS — Z23 Encounter for immunization: Secondary | ICD-10-CM

## 2023-06-01 NOTE — Progress Notes (Signed)
Subjective:    Patient ID: Alex [name], adult    DOB: [birth year], 42 y.o.   MRN: [id number]  Chief Complaint  Patient presents with   Annual Exam    HPI 42 y.o. patient presents today for physical exam. Works 12 hour shifts Sat, Sun, Mon.   Current Care Team: [facility] - post-COVID, some exercise induced asthma; Flovent daily and albuterol prn [facility] - some venous insufficiency left leg; no pain or issues  [facility] - psych and counseling   Acute concerns: None  Health maintenance: Lifestyle/ exercise: [facility] [employer]; working on nutrition and exercise - trying to get back consistent Mental health: Currently switching from Cymbalta to Effexor; Buspar not really helping much for ruminating thoughts; hydroxyzine only rarely if needed for anxiety Caffeine: Coffee daily  Sleep: Going OK; sometimes struggles to fall asleep; sleep study in ?2015 - negative Substance use: None ETOH: Usually glass of whiskey once per week  Sexual activity: Monogamous Immunizations: UTD  Colonoscopy: Cancer screening at age 42, low-risk   Past Medical History:  Diagnosis Date   Anxiety    Arterial embolism of left leg (HCC)    Asthma    exercise-induced   Constipation    Depression    Infertility male    Palpitation    SOB (shortness of breath)    Vitamin D deficiency     Past Surgical History:  Procedure Laterality Date   COLONOSCOPY  2017   bleeding - negative scope   WISDOM TOOTH EXTRACTION      Family History  Problem Relation Age of Onset   Anxiety disorder Mother    Depression Mother    Obesity Mother    Obesity Father    Hyperlipidemia Father    Hypertension Father    Depression Father    Anxiety disorder Father    Asthma Father    Cancer Maternal Grandfather        skin & lung   Emphysema Maternal Grandfather    Cancer Paternal Grandfather        leukemia    Social History   Tobacco Use   Smoking  status: Never   Smokeless tobacco: Never  Vaping Use   Vaping status: Never Used  Substance Use Topics   Alcohol use: Not Currently    Comment: occasional   Drug use: No     No Known Allergies  Review of Systems NEGATIVE UNLESS OTHERWISE INDICATED IN HPI      Objective:     BP 136/82   Pulse 76   Temp (!) 97.3 F (36.3 C)   Ht 5\' 11"  (1.803 m)   Wt (!) 339 lb 3.2 oz (153.9 kg)   SpO2 97%   BMI 47.31 kg/m   Wt Readings from Last 3 Encounters:  06/01/23 (!) 339 lb 3.2 oz (153.9 kg)  05/25/23 (!) 335 lb (152 kg)  04/27/23 (!) 332 lb (150.6 kg)    BP Readings from Last 3 Encounters:  06/01/23 136/82  05/25/23 119/71  04/27/23 104/71     Physical Exam Vitals and nursing note reviewed.  Constitutional:      General: Alex [name] is not in acute distress.    Appearance: Normal appearance. Alex [name] is obese. Alex [name] is not toxic-appearing.  HENT:     Head: Normocephalic and atraumatic.     Right Ear: Tympanic membrane, ear canal and external ear normal.     Left Ear: Tympanic membrane, ear canal and external  ear normal.     Nose: Nose normal.     Mouth/Throat:     Mouth: Mucous membranes are moist.     Pharynx: Oropharynx is clear.  Eyes:     Extraocular Movements: Extraocular movements intact.     Conjunctiva/sclera: Conjunctivae normal.     Pupils: Pupils are equal, round, and reactive to light.  Cardiovascular:     Rate and Rhythm: Normal rate and regular rhythm.     Pulses: Normal pulses.     Heart sounds: Normal heart sounds.  Pulmonary:     Effort: Pulmonary effort is normal.     Breath sounds: Normal breath sounds.  Abdominal:     General: Abdomen is flat. Bowel sounds are normal.     Palpations: Abdomen is soft.     Tenderness: There is no abdominal tenderness.  Musculoskeletal:        General: Normal range of motion.     Cervical back: Normal range of motion and neck supple.     Right lower leg: No edema.     Left lower leg: No edema.  Skin:     General: Skin is warm and dry.     Findings: No lesion or rash.  Neurological:     General: No focal deficit present.     Mental Status: Alex [name] is alert and oriented to person, place, and time.  Psychiatric:        Mood and Affect: Mood normal.        Behavior: Behavior normal.        Assessment & Plan:   Problem List Items Addressed This Visit       Endocrine   Insulin resistance   Relevant Orders   Insulin, random     Other   Vitamin D deficiency   Relevant Orders   Vitamin D (25 hydroxy)   Other Visit Diagnoses     Encounter for annual physical exam    -  Primary   Relevant Orders   CBC with Differential/Platelet   Lipid panel   TSH   Comp Met (CMET)   Vitamin D (25 hydroxy)   Hemoglobin A1c   Insulin, random   Immunization due       Relevant Orders   Tdap vaccine greater than or equal to 7yo IM        Plan: Age-appropriate screening and counseling performed today. Will check labs and call with results. Preventive measures discussed and printed in AVS for patient.  -Encouraged to keep up good work with [facility] -Regular f/up with specialists -Refer to sports medicine for left foot pain  Patient Counseling: [x]   Nutrition: Stressed importance of moderation in sodium/caffeine intake, saturated fat and cholesterol, caloric balance, sufficient intake of fresh fruits, vegetables, and fiber.  [x]   Stressed the importance of regular exercise.   [x]   Substance Abuse: Discussed cessation/primary prevention of tobacco, alcohol, or other drug use; driving or other dangerous activities under the influence; availability of treatment for abuse.   []   Injury prevention: Discussed safety belts, safety helmets, smoke detector, smoking near bedding or upholstery.   []   Sexuality: Discussed sexually transmitted diseases, partner selection, use of condoms, avoidance of unintended pregnancy  and contraceptive alternatives.   [x]   Dental health: Discussed importance of regular  tooth brushing, flossing, and dental visits.  [x]   Health maintenance and immunizations reviewed. Please refer to Health maintenance section.      Return in about 1 year (around 05/31/2024) for physical.   Kashae Carstens  M Laterrance Nauta, PA-C

## 2023-06-01 NOTE — Addendum Note (Signed)
Addended by: Lorn Junes on: 06/01/2023 01:46 PM   Modules accepted: Orders

## 2023-06-08 DIAGNOSIS — F411 Generalized anxiety disorder: Secondary | ICD-10-CM | POA: Diagnosis not present

## 2023-06-08 DIAGNOSIS — F331 Major depressive disorder, recurrent, moderate: Secondary | ICD-10-CM | POA: Diagnosis not present

## 2023-06-15 ENCOUNTER — Other Ambulatory Visit (HOSPITAL_COMMUNITY): Payer: Self-pay

## 2023-06-15 DIAGNOSIS — F331 Major depressive disorder, recurrent, moderate: Secondary | ICD-10-CM | POA: Diagnosis not present

## 2023-06-15 DIAGNOSIS — F411 Generalized anxiety disorder: Secondary | ICD-10-CM | POA: Diagnosis not present

## 2023-06-15 MED ORDER — VENLAFAXINE HCL ER 150 MG PO CP24
150.0000 mg | ORAL_CAPSULE | Freq: Every day | ORAL | 0 refills | Status: DC
  Filled 2023-06-15: qty 30, 30d supply, fill #0

## 2023-06-22 ENCOUNTER — Other Ambulatory Visit (HOSPITAL_COMMUNITY): Payer: Self-pay

## 2023-06-22 ENCOUNTER — Ambulatory Visit (INDEPENDENT_AMBULATORY_CARE_PROVIDER_SITE_OTHER): Payer: 59 | Admitting: Family Medicine

## 2023-06-22 ENCOUNTER — Encounter (INDEPENDENT_AMBULATORY_CARE_PROVIDER_SITE_OTHER): Payer: Self-pay | Admitting: Family Medicine

## 2023-06-22 VITALS — BP 128/75 | HR 79 | Temp 98.0°F | Ht 71.0 in | Wt 336.0 lb

## 2023-06-22 DIAGNOSIS — F418 Other specified anxiety disorders: Secondary | ICD-10-CM | POA: Diagnosis not present

## 2023-06-22 DIAGNOSIS — E88819 Insulin resistance, unspecified: Secondary | ICD-10-CM

## 2023-06-22 DIAGNOSIS — Z6841 Body Mass Index (BMI) 40.0 and over, adult: Secondary | ICD-10-CM

## 2023-06-22 DIAGNOSIS — E559 Vitamin D deficiency, unspecified: Secondary | ICD-10-CM

## 2023-06-22 DIAGNOSIS — E669 Obesity, unspecified: Secondary | ICD-10-CM | POA: Diagnosis not present

## 2023-06-22 MED ORDER — VITAMIN D (ERGOCALCIFEROL) 1.25 MG (50000 UNIT) PO CAPS
50000.0000 [IU] | ORAL_CAPSULE | ORAL | 0 refills | Status: DC
Start: 2023-06-22 — End: 2023-08-17
  Filled 2023-06-22: qty 4, 28d supply, fill #0

## 2023-06-23 NOTE — Progress Notes (Signed)
Chief Complaint:   OBESITY Alex [name] is here to discuss Acey's progress with Cleburne's obesity treatment plan along with follow-up of Rondarius's obesity related diagnoses. Constance is on keeping a food journal and adhering to recommended goals of 1200-1500 calories and 85+ grams of protein and states Cartier is following Nicklous's eating plan approximately 25% of the time. Demarrius states Ness is doing 0 minutes 0 times per week.  Today's visit was #: 33 Starting weight: 311 lbs Starting date: 06/25/2021 Today's weight: 336 lbs Today's date: 06/22/2023 Total lbs lost to date: 0 Total lbs lost since last in-office visit: 0  Interim History: Patient tried to be mindful with his eating and he is trying to pay attention to hunger cues.  Subjective:   1. Vitamin D deficiency Patient is stable on vitamin D, and his labs are almost at goal.  No side effects were noted.  I discussed labs with the patient today.  2. Insulin resistance Patient was educated about insulin resistance and how this can change how his body sends hunger cues.  I discussed labs with the patient today.  3. Depression with anxiety Patient is having his medications adjusted.  He shows some signs of ADHD, but he has tested negative.  He is working on making strategies to help with "all or nothing" thinking.  Assessment/Plan:   1. Vitamin D deficiency Patient will continue prescription vitamin D 50,000 IU once weekly, and we will refill for 1 month.  - Vitamin D, Ergocalciferol, (DRISDOL) 1.25 MG (50000 UNIT) CAPS capsule; Take 1 capsule (50,000 Units total) by mouth every 7 (seven) days.  Dispense: 4 capsule; Refill: 0  2. Insulin resistance Patient will continue with his diet and work on decreasing simple carbohydrates.  We will continue to follow.  3. Depression with anxiety Patient was offered support and encouragement.  He will work on the homework his therapist has given him.  We will follow-up at his next  visit in 1 month.  4. BMI 45.0-49.9, adult (HCC)  5. Obesity, Beginning BMI 44.62 Granite is currently in the action stage of change. As such, Lucio's goal is to continue with weight loss efforts. Albino has agreed to keeping a food journal and adhering to recommended goals of 1200-1500 calories and 85+ grams of protein daily.   Behavioral modification strategies: increasing lean protein intake.  Parker has agreed to follow-up with our clinic in 4 weeks. Jeffy was informed of the importance of frequent follow-up visits to maximize Rollin's success with intensive lifestyle modifications for Tayjon's multiple health conditions.   Objective:   Blood pressure 128/75, pulse 79, temperature 98 F (36.7 C), height 5\' 11"  (1.803 m), weight (!) 336 lb (152.4 kg), SpO2 97%. Body mass index is 46.86 kg/m.  Lab Results  Component Value Date   CREATININE 0.73 06/01/2023   BUN 12 06/01/2023   NA 137 06/01/2023   K 4.4 06/01/2023   CL 105 06/01/2023   CO2 23 06/01/2023   Lab Results  Component Value Date   ALT 30 06/01/2023   AST 22 06/01/2023   ALKPHOS 79 09/15/2022   BILITOT 0.5 06/01/2023   Lab Results  Component Value Date   HGBA1C 5.1 09/15/2022   HGBA1C 4.9 03/17/2022   HGBA1C 5.0 11/26/2021   HGBA1C 5.0 06/25/2021   Lab Results  Component Value Date   INSULIN 5.9 09/15/2022   INSULIN 10.0 03/17/2022   INSULIN 10.1 11/26/2021   INSULIN 6.5 06/25/2021   Lab Results  Component Value  Date   TSH 2.00 06/01/2023   Lab Results  Component Value Date   CHOL 189 06/01/2023   HDL 51 06/01/2023   LDLCALC 121 (H) 06/01/2023   TRIG 77 06/01/2023   CHOLHDL 3.7 06/01/2023   Lab Results  Component Value Date   VD25OH 48 06/01/2023   VD25OH 44.3 09/15/2022   VD25OH 59.7 03/17/2022   Lab Results  Component Value Date   WBC 6.4 03/17/2022   HGB 15.3 03/17/2022   HCT 45.7 03/17/2022   MCV 88 03/17/2022   PLT 200 03/17/2022   Lab Results  Component Value Date    IRON 61 11/26/2021   TIBC 373 11/26/2021   FERRITIN 29 (L) 11/26/2021   Attestation Statements:   Reviewed by clinician on day of visit: allergies, medications, problem list, medical history, surgical history, family history, social history, and previous encounter notes.   I, Burt Knack, am acting as transcriptionist for Quillian Quince, MD.  I have reviewed the above documentation for accuracy and completeness, and I agree with the above. -  Quillian Quince, MD

## 2023-06-24 DIAGNOSIS — F331 Major depressive disorder, recurrent, moderate: Secondary | ICD-10-CM | POA: Diagnosis not present

## 2023-06-24 DIAGNOSIS — F411 Generalized anxiety disorder: Secondary | ICD-10-CM | POA: Diagnosis not present

## 2023-07-01 ENCOUNTER — Other Ambulatory Visit: Payer: Self-pay | Admitting: Oncology

## 2023-07-01 DIAGNOSIS — Z006 Encounter for examination for normal comparison and control in clinical research program: Secondary | ICD-10-CM

## 2023-07-02 ENCOUNTER — Other Ambulatory Visit (HOSPITAL_COMMUNITY): Payer: Self-pay

## 2023-07-08 DIAGNOSIS — F411 Generalized anxiety disorder: Secondary | ICD-10-CM | POA: Diagnosis not present

## 2023-07-08 DIAGNOSIS — F331 Major depressive disorder, recurrent, moderate: Secondary | ICD-10-CM | POA: Diagnosis not present

## 2023-07-13 ENCOUNTER — Other Ambulatory Visit (HOSPITAL_COMMUNITY): Payer: Self-pay

## 2023-07-13 DIAGNOSIS — F411 Generalized anxiety disorder: Secondary | ICD-10-CM | POA: Diagnosis not present

## 2023-07-13 DIAGNOSIS — F331 Major depressive disorder, recurrent, moderate: Secondary | ICD-10-CM | POA: Diagnosis not present

## 2023-07-13 MED ORDER — VENLAFAXINE HCL ER 150 MG PO CP24
150.0000 mg | ORAL_CAPSULE | Freq: Every day | ORAL | 0 refills | Status: DC
  Filled 2023-07-13: qty 30, 30d supply, fill #0

## 2023-07-20 ENCOUNTER — Encounter (INDEPENDENT_AMBULATORY_CARE_PROVIDER_SITE_OTHER): Payer: Self-pay | Admitting: Family Medicine

## 2023-07-20 ENCOUNTER — Ambulatory Visit (INDEPENDENT_AMBULATORY_CARE_PROVIDER_SITE_OTHER): Payer: 59 | Admitting: Family Medicine

## 2023-07-20 VITALS — BP 109/59 | HR 76 | Temp 97.8°F | Ht 71.0 in | Wt 336.0 lb

## 2023-07-20 DIAGNOSIS — R5383 Other fatigue: Secondary | ICD-10-CM | POA: Diagnosis not present

## 2023-07-20 DIAGNOSIS — Z6841 Body Mass Index (BMI) 40.0 and over, adult: Secondary | ICD-10-CM | POA: Diagnosis not present

## 2023-07-20 DIAGNOSIS — E669 Obesity, unspecified: Secondary | ICD-10-CM | POA: Diagnosis not present

## 2023-07-22 NOTE — Progress Notes (Signed)
Chief Complaint:   OBESITY Depaul is here to discuss Yug's progress with Kyzen's obesity treatment plan along with follow-up of Madsen's obesity related diagnoses. Wagner is on keeping a food journal and adhering to recommended goals of 1200-1500 calories and 85+ grams of protein and states Daid is following Jaidan's eating plan approximately 75% of the time. Zanden states Branden is weight lifting for 60 minutes 2 times per week.  Today's visit was #: 34 Starting weight: 311 lbs Starting date: 06/25/2021 Today's weight: 336 lbs Today's date: 07/20/2023 Total lbs lost to date: 0 Total lbs lost since last in-office visit: 0  Interim History: Patient has done well with maintaining his weight.  He did especially well with meeting his protein goals.  He has tried to increase his strengthening exercise.  Subjective:   1. Fatigue Patient notes increase in fatigue recently which is making exercise more difficult.  His vitamin levels and TSH were done recently and within normal limits.  He has increased his exercise.  Assessment/Plan:   1. Fatigue Patient is to continue to make sure he is eating all of the food on his plan and he gets in at least 7 hours of sleep at night.  We will follow-up at his next visit in 1 month.  2. BMI 45.0-49.9, adult (HCC)  3. Obesity, Beginning BMI 44.62 Ulysee is currently in the action stage of change. As such, Jovanne's goal is to continue with weight loss efforts. Achyuth has agreed to keeping a food journal and adhering to recommended goals of 1200-1500 calories and 85+ grams of protein daily.   Patient was encouraged to work on meal planning, and strategies were discussed.  Exercise goals: As is.   Behavioral modification strategies: increasing lean protein intake and meal planning and cooking strategies.  Letrell has agreed to follow-up with our clinic in 4 weeks. Berger was informed of the importance of frequent follow-up visits  to maximize Jermine's success with intensive lifestyle modifications for Taurus's multiple health conditions.   Objective:   Blood pressure (!) 109/59, pulse 76, temperature 97.8 F (36.6 C), height 5\' 11"  (1.803 m), weight (!) 336 lb (152.4 kg), SpO2 99%. Body mass index is 46.86 kg/m.  Lab Results  Component Value Date   CREATININE 0.73 06/01/2023   BUN 12 06/01/2023   NA 137 06/01/2023   K 4.4 06/01/2023   CL 105 06/01/2023   CO2 23 06/01/2023   Lab Results  Component Value Date   ALT 30 06/01/2023   AST 22 06/01/2023   ALKPHOS 79 09/15/2022   BILITOT 0.5 06/01/2023   Lab Results  Component Value Date   HGBA1C 5.1 09/15/2022   HGBA1C 4.9 03/17/2022   HGBA1C 5.0 11/26/2021   HGBA1C 5.0 06/25/2021   Lab Results  Component Value Date   INSULIN 5.9 09/15/2022   INSULIN 10.0 03/17/2022   INSULIN 10.1 11/26/2021   INSULIN 6.5 06/25/2021   Lab Results  Component Value Date   TSH 2.00 06/01/2023   Lab Results  Component Value Date   CHOL 189 06/01/2023   HDL 51 06/01/2023   LDLCALC 121 (H) 06/01/2023   TRIG 77 06/01/2023   CHOLHDL 3.7 06/01/2023   Lab Results  Component Value Date   VD25OH 48 06/01/2023   VD25OH 44.3 09/15/2022   VD25OH 59.7 03/17/2022   Lab Results  Component Value Date   WBC 6.4 03/17/2022   HGB 15.3 03/17/2022   HCT 45.7 03/17/2022   MCV 88 03/17/2022  PLT 200 03/17/2022   Lab Results  Component Value Date   IRON 61 11/26/2021   TIBC 373 11/26/2021   FERRITIN 29 (L) 11/26/2021   Attestation Statements:   Reviewed by clinician on day of visit: allergies, medications, problem list, medical history, surgical history, family history, social history, and previous encounter notes.  Time spent on visit including pre-visit chart review and post-visit care and charting was 25 minutes.   I, Burt Knack, am acting as transcriptionist for Quillian Quince, MD.  I have reviewed the above documentation for accuracy and completeness, and  I agree with the above. -  Quillian Quince, MD

## 2023-08-05 ENCOUNTER — Other Ambulatory Visit: Payer: Self-pay | Admitting: Internal Medicine

## 2023-08-05 DIAGNOSIS — R0602 Shortness of breath: Secondary | ICD-10-CM

## 2023-08-05 DIAGNOSIS — J301 Allergic rhinitis due to pollen: Secondary | ICD-10-CM

## 2023-08-05 DIAGNOSIS — J4530 Mild persistent asthma, uncomplicated: Secondary | ICD-10-CM

## 2023-08-09 ENCOUNTER — Other Ambulatory Visit: Payer: Self-pay | Admitting: Internal Medicine

## 2023-08-09 DIAGNOSIS — J301 Allergic rhinitis due to pollen: Secondary | ICD-10-CM

## 2023-08-09 DIAGNOSIS — R0602 Shortness of breath: Secondary | ICD-10-CM

## 2023-08-09 DIAGNOSIS — J4530 Mild persistent asthma, uncomplicated: Secondary | ICD-10-CM

## 2023-08-10 ENCOUNTER — Other Ambulatory Visit (HOSPITAL_COMMUNITY): Payer: Self-pay

## 2023-08-10 DIAGNOSIS — F411 Generalized anxiety disorder: Secondary | ICD-10-CM | POA: Diagnosis not present

## 2023-08-10 DIAGNOSIS — F331 Major depressive disorder, recurrent, moderate: Secondary | ICD-10-CM | POA: Diagnosis not present

## 2023-08-10 MED ORDER — VENLAFAXINE HCL ER 150 MG PO CP24
150.0000 mg | ORAL_CAPSULE | Freq: Every day | ORAL | 0 refills | Status: DC
  Filled 2023-08-10 – 2023-08-24 (×2): qty 60, 60d supply, fill #0

## 2023-08-17 ENCOUNTER — Other Ambulatory Visit (HOSPITAL_COMMUNITY): Payer: Self-pay

## 2023-08-17 ENCOUNTER — Encounter (INDEPENDENT_AMBULATORY_CARE_PROVIDER_SITE_OTHER): Payer: Self-pay | Admitting: Family Medicine

## 2023-08-17 ENCOUNTER — Ambulatory Visit (INDEPENDENT_AMBULATORY_CARE_PROVIDER_SITE_OTHER): Payer: 59 | Admitting: Family Medicine

## 2023-08-17 VITALS — BP 112/76 | HR 85 | Temp 98.1°F | Ht 71.0 in | Wt 337.0 lb

## 2023-08-17 DIAGNOSIS — F418 Other specified anxiety disorders: Secondary | ICD-10-CM | POA: Diagnosis not present

## 2023-08-17 DIAGNOSIS — R0602 Shortness of breath: Secondary | ICD-10-CM

## 2023-08-17 DIAGNOSIS — E669 Obesity, unspecified: Secondary | ICD-10-CM

## 2023-08-17 DIAGNOSIS — E559 Vitamin D deficiency, unspecified: Secondary | ICD-10-CM | POA: Diagnosis not present

## 2023-08-17 DIAGNOSIS — Z6841 Body Mass Index (BMI) 40.0 and over, adult: Secondary | ICD-10-CM | POA: Diagnosis not present

## 2023-08-17 MED ORDER — VITAMIN D (ERGOCALCIFEROL) 1.25 MG (50000 UNIT) PO CAPS
50000.0000 [IU] | ORAL_CAPSULE | ORAL | 0 refills | Status: DC
  Filled 2023-08-17: qty 4, 28d supply, fill #0

## 2023-08-17 NOTE — Progress Notes (Signed)
.[facility]  Office: [phone]  /  Fax: [phone]  WEIGHT SUMMARY AND BIOMETRICS  Anthropometric Measurements Height: 5\' 11"  (1.803 m) Weight: (!) 337 lb (152.9 kg) BMI (Calculated): 47.02 Weight at Last Visit: 336 lb Weight Lost Since Last Visit: 0 Weight Gained Since Last Visit: 1 lb Starting Weight: 311 lb Total Weight Loss (lbs): 0 lb (0 kg)   Body Composition  Body Fat %: 41.2 % Fat Mass (lbs): 139 lbs Muscle Mass (lbs): 188.8 lbs Total Body Water (lbs): 142.2 lbs Visceral Fat Rating : 26   Other Clinical Data RMR: [id number] Fasting: Yes Labs: Yes Today's Visit #: 35 Starting Date: 06/25/21 Comments: IC DONE TODAY    Chief Complaint: OBESITY  History of Present Illness   The patient, with a history of obesity, vitamin D deficiency, and exercise-induced shortness of breath, presents for a follow-up consultation. Over the past month, he has gained one pound despite efforts to maintain a diet of 1200-1500 calories and 85 grams of protein daily, which he has been able to adhere to approximately 20% of the time. He has been engaging in weightlifting exercises for about 60 minutes twice a week.  Recently, the patient returned from a five-day camping trip, during which he was notably active, averaging 16,000-18,000 steps per day. However, he reports feeling fatigued and is still recovering from the trip.  The patient has been tracking his workouts using an app, focusing on upper body strength with push and pull days. He also aims to incorporate cycling for leg strengthening. He has noticed a fluctuation in his appetite since starting on Effexor, with days of low appetite alternating with days of increased hunger.  The patient has also been dealing with emotional stress due to the sudden passing of a friend, which has affected his mood and potentially his eating habits. He recently weaned off Buspar as he did not notice any positive effects from the medication. He is on Effexor now  but is not certain how much it has helped yet.  The patient's recent indirect calorimeter test showed an improvement in his resting metabolic rate, which is now closer to the expected level for his age, sex, height, weight, and muscle mass.          PHYSICAL EXAM:  Blood pressure 112/76, pulse 85, temperature 98.1 F (36.7 C), height 5\' 11"  (1.803 m), weight (!) 337 lb (152.9 kg), SpO2 99%. Body mass index is 47 kg/m.  DIAGNOSTIC DATA REVIEWED:  BMET    Component Value Date/Time   NA 137 06/01/2023 1347   NA 141 09/15/2022 1025   K 4.4 06/01/2023 1347   CL 105 06/01/2023 1347   CO2 23 06/01/2023 1347   GLUCOSE 94 06/01/2023 1347   BUN 12 06/01/2023 1347   BUN 17 09/15/2022 1025   CREATININE 0.73 06/01/2023 1347   CALCIUM 8.8 06/01/2023 1347   GFRNONAA >60 06/05/2021 1542   GFRAA 119 03/19/2020 0913   Lab Results  Component Value Date   HGBA1C 5.1 09/15/2022   HGBA1C 5.0 06/25/2021   Lab Results  Component Value Date   INSULIN 5.9 09/15/2022   INSULIN 6.5 06/25/2021   Lab Results  Component Value Date   TSH 2.00 06/01/2023   CBC    Component Value Date/Time   WBC 6.4 03/17/2022 0940   WBC 7.0 06/05/2021 1542   RBC 5.20 03/17/2022 0940   RBC 4.91 06/05/2021 1542   HGB 15.3 03/17/2022 0940   HCT 45.7 03/17/2022 0940   PLT 200  03/17/2022 0940   MCV 88 03/17/2022 0940   MCH 29.4 03/17/2022 0940   MCH 27.7 06/05/2021 1542   MCHC 33.5 03/17/2022 0940   MCHC 32.1 06/05/2021 1542   RDW 12.7 03/17/2022 0940   Iron Studies    Component Value Date/Time   IRON 61 11/26/2021 0832   TIBC 373 11/26/2021 0832   FERRITIN 29 (L) 11/26/2021 0832   IRONPCTSAT 16 11/26/2021 0832   Lipid Panel     Component Value Date/Time   CHOL 189 06/01/2023 1347   CHOL 184 09/15/2022 1025   TRIG 77 06/01/2023 1347   HDL 51 06/01/2023 1347   HDL 58 09/15/2022 1025   CHOLHDL 3.7 06/01/2023 1347   LDLCALC 121 (H) 06/01/2023 1347   Hepatic Function Panel     Component  Value Date/Time   PROT 6.5 06/01/2023 1347   PROT 6.7 09/15/2022 1025   ALBUMIN 4.3 09/15/2022 1025   AST 22 06/01/2023 1347   ALT 30 06/01/2023 1347   ALKPHOS 79 09/15/2022 1025   BILITOT 0.5 06/01/2023 1347   BILITOT 0.5 09/15/2022 1025      Component Value Date/Time   TSH 2.00 06/01/2023 1347   Nutritional Lab Results  Component Value Date   VD25OH 48 06/01/2023   VD25OH 44.3 09/15/2022   VD25OH 59.7 03/17/2022     Assessment and Plan    Obesity Weight gain of 1 pound over the past month. Patient is attempting to journal his diet and maintain a caloric intake of 1200-1500 calories with 85g of protein, but is only successful about 20% of the time. Patient is also engaging in weight lifting exercises twice a week. Recent increase in physical activity due to a camping trip. -Continue current diet and exercise regimen. -Encourage consistent meal planning and journaling. -Plan to reassess weight and discuss progress at next visit.  Vitamin D Deficiency Last Vitamin D level was 48, which is near optimal. -Continue current Vitamin D supplementation. -Plan to recheck Vitamin D level after the first of the year.  Shortness of Breath with Exercise Indirect calorimeter test was repeated. Results showed an improvement in resting metabolic rate, which is now at the expected level for the patient's age, sex, height, weight, and muscle mass. -Continue current exercise regimen, with a focus on maintaining protein intake and strength training. -Consider adding lower body exercises for increased metabolic benefit. -Plan to reassess shortness of breath with exercise at next visit.  Depression with Anxiety Patient recently switched to Effexor and weaned off Buspar. Reports fluctuating appetite and recent mood instability, but acknowledges recent significant life stressors. -Continue Effexor as prescribed. -Plan to reassess mood and medication effects at next visit.  General Health  Maintenance / Followup Plans -Next appointment scheduled before Thanksgiving. -Plan to recheck labs after the first of the year. -Refill Vitamin D prescription.       Alex [name] was informed of the importance of frequent follow up visits to maximize Alex [name]'s success with intensive lifestyle modifications for Kayl's multiple health conditions.    Quillian Quince, MD

## 2023-08-20 ENCOUNTER — Other Ambulatory Visit (HOSPITAL_COMMUNITY): Payer: Self-pay

## 2023-08-24 ENCOUNTER — Other Ambulatory Visit: Payer: Self-pay

## 2023-08-24 ENCOUNTER — Other Ambulatory Visit (HOSPITAL_COMMUNITY): Payer: Self-pay

## 2023-08-24 ENCOUNTER — Other Ambulatory Visit: Payer: Self-pay | Admitting: Family Medicine

## 2023-08-24 DIAGNOSIS — M5417 Radiculopathy, lumbosacral region: Secondary | ICD-10-CM

## 2023-08-25 NOTE — Telephone Encounter (Signed)
Requested Prescriptions  Pending Prescriptions Disp Refills   meloxicam (MOBIC) 15 MG tablet 30 tablet 2    Sig: Take 1 tablet (15 mg total) by mouth daily.     Analgesics:  COX2 Inhibitors Failed - 08/24/2023  7:52 AM      Failed - Manual Review: Labs are only required if the patient has taken medication for more than 8 weeks.      Failed - HGB in normal range and within 360 days    Hemoglobin  Date Value Ref Range Status  03/17/2022 15.3 13.0 - 17.7 g/dL Final         Failed - HCT in normal range and within 360 days    Hematocrit  Date Value Ref Range Status  03/17/2022 45.7 37.5 - 51.0 % Final         Failed - Valid encounter within last 12 months    Recent Outpatient Visits           1 year ago Lipoma of torso   [facility] In[facility], MD   1 year ago Edema of left lower leg   [facility], MD   2 years ago Annual physical exam   [facility], [facility]   2 years ago Chest pain, unspecified type   [facility]   3 years ago Asthma, exercise induced   [facility], MD       Future Appointments             In 2 days Charlott Holler, MD  [facility]   In 9 months Allwardt, Crist Infante, PA-C  [facility]            Passed - Cr in normal range and within 360 days    Creat  Date Value Ref Range Status  06/01/2023 0.73 0.60 - 1.26 mg/dL Final         Passed - AST in normal range and within 360 days    AST  Date Value Ref Range Status  06/01/2023 22 10 - 40 U/L Final         Passed - ALT in normal range and within 360 days    ALT  Date Value Ref Range Status  06/01/2023 30 9 - 46 U/L  Final         Passed - eGFR is 30 or above and within 360 days    GFR calc Af Amer  Date Value Ref Range Status  03/19/2020 119 >59 mL/min/1.73 Final    Comment:    **[facility] currently reports eGFR in compliance with the current**   recommendations of the [employer]. [facility] will   update reporting as new guidelines are published from the NKF-ASN   Task force.    GFR, Estimated  Date Value Ref Range Status  06/05/2021 >60 >60 mL/min Final    Comment:    (NOTE) Calculated using the CKD-EPI Creatinine Equation (2021)    eGFR  Date Value Ref Range Status  09/15/2022 113 >59 mL/min/1.73 Final         Passed - Patient is not pregnant

## 2023-08-27 ENCOUNTER — Other Ambulatory Visit (HOSPITAL_COMMUNITY): Payer: Self-pay

## 2023-08-27 ENCOUNTER — Encounter (HOSPITAL_COMMUNITY): Payer: Self-pay

## 2023-08-27 ENCOUNTER — Encounter: Payer: Self-pay | Admitting: Internal Medicine

## 2023-08-27 ENCOUNTER — Ambulatory Visit (INDEPENDENT_AMBULATORY_CARE_PROVIDER_SITE_OTHER): Payer: 59 | Admitting: Internal Medicine

## 2023-08-27 DIAGNOSIS — R0602 Shortness of breath: Secondary | ICD-10-CM | POA: Diagnosis not present

## 2023-08-27 DIAGNOSIS — J301 Allergic rhinitis due to pollen: Secondary | ICD-10-CM

## 2023-08-27 DIAGNOSIS — J4530 Mild persistent asthma, uncomplicated: Secondary | ICD-10-CM | POA: Diagnosis not present

## 2023-08-27 MED ORDER — FLUTICASONE PROPIONATE HFA 110 MCG/ACT IN AERO
1.0000 | INHALATION_SPRAY | Freq: Two times a day (BID) | RESPIRATORY_TRACT | 11 refills | Status: DC
  Filled 2023-08-27 – 2023-08-30 (×3): qty 12, 30d supply, fill #0
  Filled 2023-12-14: qty 12, 30d supply, fill #1
  Filled 2024-02-03: qty 12, 30d supply, fill #2
  Filled 2024-03-11: qty 12, 30d supply, fill #3

## 2023-08-27 NOTE — Patient Instructions (Addendum)
It was a pleasure to see you today!  Please schedule follow up scheduled with myself in 1 year.  If my schedule is not open yet, we will contact you with a reminder closer to that time. Please call [phone] if you haven't heard from [place] a month before, and always call us sooner if issues or concerns arise. You can also send [place] a message through MyChart, but but aware that this is not to be used for urgent issues and it may take up to 5-7 days to receive a reply. Please be aware that you will likely be able to view your results before I have a chance to respond to them. Please give [place] 5 business days to respond to any non-urgent results.   Continue flovent 1 puff BID. With spacer if needed Continue prn albuterol prior to exercise and as needed.  Continue intranasal steroids with nasacort. Agree with adding hydroxizine or benadryl as needed in the fall.   Enjoy [place]. Call me sooner if you need me!

## 2023-08-27 NOTE — Progress Notes (Signed)
BURNETT SPRAY    [id number]    [birth year]  Primary Care Physician:Allwardt, Crist Infante, PA-C Date of Appointment: 08/27/2023 Established Patient Visit  Chief complaint:   Chief Complaint  Patient presents with   Follow-up    Breathing has been good  ACT 20     HPI: Alex [name] is a 42 y.o. male with history of asthma with worsening symptoms.   Interval Updates: Here for asthma follow up.  Doing well. Can tell a difference if he misses advair.  Still has seasonal allergies. Doing nasacort which is helping. Early fall was rough for him.  Hasn't needed albuterol unscheduled more than once in the last year  Feels nasacort works better for him that flonase.  Taking hydroxizine for sleep and anxiety which also seems to help his allergies. Will add benadryl to this if needed in fall.   Current Regimen: advair hfa 1 puff bid. Albuterol three times a week before exercise.  Asthma Triggers: URIs, exertion, cold weather Exacerbations in the last year: none History of hospitalization or intubation: never Allergy Testing: none GERD: denies Allergic Rhinitis: yes, controlled ACT:  Asthma Control Test ACT Total Score  08/27/2023  9:58 AM 20  02/18/2022  9:05 AM 18  12/15/2021 10:49 AM 13   FeNO: 15 ppb   I have reviewed the patient's family social and past medical history and updated as appropriate.   Past Medical History:  Diagnosis Date   Anxiety    Arterial embolism of left leg (HCC)    Asthma    exercise-induced   Constipation    Depression    Infertility male    Palpitation    SOB (shortness of breath)    Vitamin D deficiency     Past Surgical History:  Procedure Laterality Date   COLONOSCOPY  2017   bleeding - negative scope   WISDOM TOOTH EXTRACTION      Family History  Problem Relation Age of Onset   Anxiety disorder Mother    Depression Mother    Obesity Mother    Obesity Father    Hyperlipidemia Father    Hypertension Father     Depression Father    Anxiety disorder Father    Asthma Father    Cancer Maternal Grandfather        skin & lung   Emphysema Maternal Grandfather    Cancer Paternal Grandfather        leukemia    Social History   Occupational History   Not on file  Tobacco Use   Smoking status: Never   Smokeless tobacco: Never  Vaping Use   Vaping status: Never Used  Substance and Sexual Activity   Alcohol use: Not Currently    Comment: occasional   Drug use: No   Sexual activity: Yes    Birth control/protection: None     Physical Exam: Blood pressure 115/81, pulse 85, temperature 98.1 F (36.7 C), temperature source Oral, resp. rate 18, height 5\' 11"  (1.803 m), weight (!) 337 lb (152.9 kg), SpO2 98%.  Gen:      No acute distress ENT:  no nasal polyps, mucus membranes moist Lungs:    No increased respiratory effort, symmetric chest wall excursion, clear to auscultation bilaterally, \no wheezes or crackles CV:         Regular rate and rhythm; no murmurs, rubs, or gallops.  No pedal edema   Data Reviewed: Imaging: I have personally reviewed the ct  cardiac scoring lung windows - no acute process.   PFTs:      No data to display         I have personally reviewed the patient's PFTs and spirometry obtained today shows normal spirometry.   Labs: Lab Results  Component Value Date   WBC 6.4 03/17/2022   HGB 15.3 03/17/2022   HCT 45.7 03/17/2022   MCV 88 03/17/2022   PLT 200 03/17/2022   Lab Results  Component Value Date   NA 137 06/01/2023   K 4.4 06/01/2023   CL 105 06/01/2023   CO2 23 06/01/2023    Immunization status: Immunization History  Administered Date(s) Administered   Influenza,inj,Quad PF,6+ Mos 06/25/2021, 07/18/2022   Influenza-Unspecified 07/28/2019   PFIZER Comirnaty(Gray Top)Covid-19 Tri-Sucrose Vaccine 11/06/2019, 11/24/2019, 10/03/2020   Tdap 06/01/2023    External Records Personally Reviewed: PCP, vascular surgery  Assessment:  Moderate  persistent asthma, improved control Chronic allergic rhinitis  Plan/Recommendations: Continue flovent 1 puff BID. With spacer if needed Continue prn albuterol prior to exercise and as needed.  Continue intranasal steroids with nasacort. Agree with adding hydroxizine or benadryl as needed in the fall.   I spent 30 minutes in the care of this patient today including pre-charting, chart review, review of results, face-to-face care, coordination of care and communication with consultants etc.).    Return to Care: Return in about 1 year (around 08/26/2024).   Durel Salts, MD Pulmonary and Critical Care Medicine [facility] Office:[phone]

## 2023-08-30 ENCOUNTER — Other Ambulatory Visit: Payer: Self-pay

## 2023-09-02 DIAGNOSIS — F331 Major depressive disorder, recurrent, moderate: Secondary | ICD-10-CM | POA: Diagnosis not present

## 2023-09-02 DIAGNOSIS — F411 Generalized anxiety disorder: Secondary | ICD-10-CM | POA: Diagnosis not present

## 2023-09-14 ENCOUNTER — Other Ambulatory Visit (HOSPITAL_COMMUNITY): Payer: Self-pay

## 2023-09-15 ENCOUNTER — Encounter (INDEPENDENT_AMBULATORY_CARE_PROVIDER_SITE_OTHER): Payer: Self-pay | Admitting: Family Medicine

## 2023-09-15 ENCOUNTER — Ambulatory Visit (INDEPENDENT_AMBULATORY_CARE_PROVIDER_SITE_OTHER): Payer: 59 | Admitting: Family Medicine

## 2023-09-15 VITALS — BP 119/70 | HR 68 | Temp 97.9°F | Ht 71.0 in | Wt 338.0 lb

## 2023-09-15 DIAGNOSIS — E669 Obesity, unspecified: Secondary | ICD-10-CM

## 2023-09-15 DIAGNOSIS — F32A Depression, unspecified: Secondary | ICD-10-CM

## 2023-09-15 DIAGNOSIS — E559 Vitamin D deficiency, unspecified: Secondary | ICD-10-CM

## 2023-09-15 DIAGNOSIS — K5909 Other constipation: Secondary | ICD-10-CM

## 2023-09-15 DIAGNOSIS — Z6841 Body Mass Index (BMI) 40.0 and over, adult: Secondary | ICD-10-CM

## 2023-09-15 DIAGNOSIS — F3289 Other specified depressive episodes: Secondary | ICD-10-CM

## 2023-09-15 MED ORDER — VITAMIN D (ERGOCALCIFEROL) 1.25 MG (50000 UNIT) PO CAPS
50000.0000 [IU] | ORAL_CAPSULE | ORAL | 0 refills | Status: DC
Start: 2023-09-15 — End: 2023-09-28

## 2023-09-15 NOTE — Progress Notes (Signed)
.smr  Office: [phone]  /  Fax: [phone]  WEIGHT SUMMARY AND BIOMETRICS  Anthropometric Measurements Height: 5\' 11"  (1.803 m) Weight: (!) 338 lb (153.3 kg) BMI (Calculated): 47.16 Weight at Last Visit: 337 lb Weight Lost Since Last Visit: 0 Weight Gained Since Last Visit: 1 lb Starting Weight: 311 lb Total Weight Loss (lbs): 0 lb (0 kg)   Body Composition  Body Fat %: 39.7 % Fat Mass (lbs): 134.2 lbs Muscle Mass (lbs): 193.8 lbs Total Body Water (lbs): 139.8 lbs Visceral Fat Rating : 25   Other Clinical Data RMR: [id number] Fasting: no Labs: no Today's Visit #: 36 Starting Date: 06/25/21    Chief Complaint: OBESITY    History of Present Illness   The patient, with a history of vitamin D deficiency and obesity, presents for a follow-up visit. He has been adhering to a regimen of prescription vitamin D at fifty thousand international units weekly. Despite a consistent exercise routine of cardio and strength training three times a week, the patient has gained one pound over the past month. He has been maintaining a diet of approximately fifteen hundred calories daily, with a goal of consuming eighty-five or more grams of protein.  The patient has been experiencing generalized stress, which he attributes to external factors. He has noticed an increase in comfort eating, but is unsure if this is directly related to his stress levels. Despite his efforts, he expresses feelings of frustration and hopelessness due to a perceived lack of progress in his weight loss journey.  The patient has also been dealing with chronic constipation, for which he takes Miralax daily. He reports that he often feels bloated and backed up, which contributes to his overall discomfort.  In addition to his physical health concerns, the patient has been struggling with his mental health. He is currently on Effexor, but expresses dissatisfaction with its efficacy, describing his overall mood as "midway  okay." He has a history of being on Wellbutrin, which he found more effective, but stopped due to diminishing returns. He is considering discussing the possibility of combining medications with his psychiatrist to improve his mental health management.  The patient's approach to managing his health conditions includes a combination of medication, diet, exercise, and mental health care. Despite his efforts, he expresses frustration with his progress and is seeking ways to improve his overall health and well-being.          PHYSICAL EXAM:  Blood pressure 119/70, pulse 68, temperature 97.9 F (36.6 C), height 5\' 11"  (1.803 m), weight (!) 338 lb (153.3 kg), SpO2 98%. Body mass index is 47.14 kg/m.  DIAGNOSTIC DATA REVIEWED:  BMET    Component Value Date/Time   NA 137 06/01/2023 1347   NA 141 09/15/2022 1025   K 4.4 06/01/2023 1347   CL 105 06/01/2023 1347   CO2 23 06/01/2023 1347   GLUCOSE 94 06/01/2023 1347   BUN 12 06/01/2023 1347   BUN 17 09/15/2022 1025   CREATININE 0.73 06/01/2023 1347   CALCIUM 8.8 06/01/2023 1347   GFRNONAA >60 06/05/2021 1542   GFRAA 119 03/19/2020 0913   Lab Results  Component Value Date   HGBA1C 5.1 09/15/2022   HGBA1C 5.0 06/25/2021   Lab Results  Component Value Date   INSULIN 5.9 09/15/2022   INSULIN 6.5 06/25/2021   Lab Results  Component Value Date   TSH 2.00 06/01/2023   CBC    Component Value Date/Time   WBC 6.4 03/17/2022 0940   WBC 7.0  06/05/2021 1542   RBC 5.20 03/17/2022 0940   RBC 4.91 06/05/2021 1542   HGB 15.3 03/17/2022 0940   HCT 45.7 03/17/2022 0940   PLT 200 03/17/2022 0940   MCV 88 03/17/2022 0940   MCH 29.4 03/17/2022 0940   MCH 27.7 06/05/2021 1542   MCHC 33.5 03/17/2022 0940   MCHC 32.1 06/05/2021 1542   RDW 12.7 03/17/2022 0940   Iron Studies    Component Value Date/Time   IRON 61 11/26/2021 0832   TIBC 373 11/26/2021 0832   FERRITIN 29 (L) 11/26/2021 0832   IRONPCTSAT 16 11/26/2021 0832   Lipid Panel      Component Value Date/Time   CHOL 189 06/01/2023 1347   CHOL 184 09/15/2022 1025   TRIG 77 06/01/2023 1347   HDL 51 06/01/2023 1347   HDL 58 09/15/2022 1025   CHOLHDL 3.7 06/01/2023 1347   LDLCALC 121 (H) 06/01/2023 1347   Hepatic Function Panel     Component Value Date/Time   PROT 6.5 06/01/2023 1347   PROT 6.7 09/15/2022 1025   ALBUMIN 4.3 09/15/2022 1025   AST 22 06/01/2023 1347   ALT 30 06/01/2023 1347   ALKPHOS 79 09/15/2022 1025   BILITOT 0.5 06/01/2023 1347   BILITOT 0.5 09/15/2022 1025      Component Value Date/Time   TSH 2.00 06/01/2023 1347   Nutritional Lab Results  Component Value Date   VD25OH 48 06/01/2023   VD25OH 44.3 09/15/2022   VD25OH 59.7 03/17/2022     Assessment and Plan    Obesity Gained one pound in the last month. Following a 1500-calorie, 85+ grams protein/day diet. Engages in cardio and strength training for 50 minutes, three times per week. Expresses frustration and hopelessness regarding weight loss. Noted increase in muscle mass and decrease in fat. Advised against frequent home weighing to avoid misinterpretation of progress. - Continue current diet and exercise regimen - I- Reassess weight and muscle mass at next visit  Chronic Constipation Reports chronic constipation, currently taking Miralax once daily. No signs of obstruction. Previous colonoscopies with no significant findings. Discussed increasing Miralax to twice daily or adjusting dosage to one dose in the morning and half a dose later if needed. - Increase Miralax to twice daily if needed - Consider one dose in the morning and half a dose later if twice daily is too much  Depression Currently on Effexor, reports inadequate symptom control. Previously responded well to Wellbutrin. Considering combination therapy with Wellbutrin. Discussed potential benefits of combining Effexor and Wellbutrin. - Plan to discuss adding Wellbutrin to current Effexor regimen with his  psychiatrist next visit - Follow up with psychiatrist in December or sooner if possible  Vitamin D Deficiency On prescription vitamin D 50,000 IU weekly. No new symptoms reported. - Continue vitamin D 50,000 IU weekly  General Health Maintenance Advised to maintain a healthy diet and exercise regimen. Discussed strategies for managing diet during holidays and social events, including avoiding high-calorie leftovers and planning high-protein dips. - Continue 1500 calories and 100+ grams of protein per day - Avoid high-calorie leftovers from family gatherings - Plan high-protein dips for social events - Schedule fasting labs for next visit on October 12, 2023  Follow-up - Schedule January appointment at front desk - Perform fasting labs on October 12, 2023.          Royer was informed of the importance of frequent follow up visits to maximize Vallen's success with intensive lifestyle modifications for Valmore's multiple health conditions.  Quillian Quince, MD

## 2023-09-28 ENCOUNTER — Ambulatory Visit: Payer: 59 | Admitting: Physician Assistant

## 2023-09-28 ENCOUNTER — Other Ambulatory Visit (HOSPITAL_COMMUNITY): Payer: Self-pay

## 2023-09-28 VITALS — BP 110/76 | HR 81 | Temp 98.0°F | Ht 71.0 in | Wt 353.4 lb

## 2023-09-28 DIAGNOSIS — M542 Cervicalgia: Secondary | ICD-10-CM

## 2023-09-28 DIAGNOSIS — M5412 Radiculopathy, cervical region: Secondary | ICD-10-CM | POA: Diagnosis not present

## 2023-09-28 DIAGNOSIS — R519 Headache, unspecified: Secondary | ICD-10-CM

## 2023-09-28 DIAGNOSIS — R202 Paresthesia of skin: Secondary | ICD-10-CM

## 2023-09-28 MED ORDER — SUMATRIPTAN SUCCINATE 50 MG PO TABS
50.0000 mg | ORAL_TABLET | Freq: Every day | ORAL | 3 refills | Status: DC | PRN
  Filled 2023-09-28: qty 18, 30d supply, fill #0

## 2023-09-28 NOTE — Progress Notes (Signed)
Patient ID: Alex [name], adult    DOB: [birth year], 42 y.o.   MRN: [id number]   Assessment & Plan:  Cervical radiculopathy -     Ambulatory referral to Sports Medicine -     Ambulatory referral to Physical Therapy  Cervical pain -     Ambulatory referral to Sports Medicine -     Ambulatory referral to Physical Therapy  Recurrent occipital headache -     Ambulatory referral to Sports Medicine -     Ambulatory referral to Physical Therapy -     SUMAtriptan Succinate; Take 1 tablet (50 mg total) by mouth daily as needed for migraine. May repeat in 1-2 hours if headache persists or recurs.  Dispense: 10 tablet; Refill: 3  Paresthesia of both hands -     Ambulatory referral to Sports Medicine -     Ambulatory referral to Physical Therapy    Assessment and Plan    Neck pain / cervical radiculopathy ?occipital neuralgia Reports of neck stiffness, pain behind the head, and sharp stabbing pain behind the eyes. Symptoms have worsened over the past two months. No lightheadedness, balance issues, or fainting. -Refer to sports medicine for evaluation by Dr. Jean Rosenthal. -Refer to physical therapy for evaluation and possible dry needling. -Consider MRI pending specialist evaluation. -Recommend eye exam to check for increased ocular pressure.  Migraine Increase in frequency over the past year, possibly related to dehydration. No current acute treatment. -Prescribe Sumatriptan 50mg  at onset of migraine, can repeat dose but not to exceed 200mg  in 24 hours. Can be taken with NSAID. Monitor for side effects such as fatigue and tachycardia.  General Health Maintenance -Continue current management of depression. -Continue monitoring heart rate.        Return if symptoms worsen or fail to improve.    Subjective:    Chief Complaint  Patient presents with   Neck Pain    Pt c/o neck and center of head pain; no injury that he aware of, has had some changes in if holding head a certain  way has numbness in hands and fingers, sensation changes, shooting pains behind eyes. Vertigo feeling at times and happens often.     Neck Pain    Discussed the use of AI scribe software for clinical note transcription with the patient, who gave verbal consent to proceed.  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 The patient attributes these symptoms to radicular changes in the neck due to their work posture in the operating room. The patient reports that the symptoms have worsened over the past two months, with the addition of a stiff neck, pain at the back of the head, and sharp stabbing sensations behind the eyes. The patient also reports occasional vertigo, which is associated with the sharp pain. The patient has a history of participating in combat sports, which they suspect might have contributed to their current symptoms. Over the past year, the patient has noticed an increase in the frequency of migraines, which they attribute to dehydration. The patient has not sought any treatment for these symptoms prior to this visit.       Past Medical History:  Diagnosis Date   Anxiety    Arterial embolism of left leg (HCC)    Asthma    exercise-induced   Constipation    Depression    Infertility  male    Palpitation    SOB (shortness of breath)    Vitamin D deficiency     Past Surgical History:  Procedure Laterality Date   COLONOSCOPY  2017   bleeding - negative scope   WISDOM TOOTH EXTRACTION      Family History  Problem Relation Age of Onset   Anxiety disorder Mother    Depression Mother    Obesity Mother    Obesity Father    Hyperlipidemia Father    Hypertension Father    Depression Father    Anxiety disorder Father    Asthma Father    Cancer Maternal Grandfather        skin & lung   Emphysema Maternal Grandfather     Cancer Paternal Grandfather        leukemia    Social History   Tobacco Use   Smoking status: Never   Smokeless tobacco: Never  Vaping Use   Vaping status: Never Used  Substance Use Topics   Alcohol use: Not Currently    Comment: occasional   Drug use: No     No Known Allergies  Review of Systems  Musculoskeletal:  Positive for neck pain.   NEGATIVE UNLESS OTHERWISE INDICATED IN HPI      Objective:     BP 110/76 (BP Location: Left Arm, Patient Position: Sitting)   Pulse 81   Temp 98 F (36.7 C) (Temporal)   Ht 5\' 11"  (1.803 m)   Wt (!) 353 lb 6.4 oz (160.3 kg)   SpO2 98%   BMI 49.29 kg/m   Wt Readings from Last 3 Encounters:  09/28/23 (!) 353 lb 6.4 oz (160.3 kg)  09/15/23 (!) 338 lb (153.3 kg)  08/27/23 (!) 337 lb (152.9 kg)    BP Readings from Last 3 Encounters:  09/28/23 110/76  09/15/23 119/70  08/27/23 115/81     Physical Exam Vitals and nursing note reviewed.  Constitutional:      Appearance: Normal appearance. Alex [name] is obese.  Cardiovascular:     Rate and Rhythm: Normal rate and regular rhythm.     Pulses: Normal pulses.     Heart sounds: Normal heart sounds.  Pulmonary:     Effort: Pulmonary effort is normal.     Breath sounds: Normal breath sounds.  Musculoskeletal:     Cervical back: No spasms, torticollis, tenderness or bony tenderness. Normal range of motion.     Right lower leg: No edema.     Left lower leg: No edema.  Neurological:     General: No focal deficit present.     Mental Status: Alex [name] is alert and oriented to person, place, and time.     Cranial Nerves: No cranial nerve deficit.     Sensory: No sensory deficit.     Motor: No weakness.     Gait: Gait normal.       Aimee Heldman M Brodric Schauer, PA-C

## 2023-09-29 ENCOUNTER — Ambulatory Visit: Payer: 59 | Admitting: Physician Assistant

## 2023-09-29 ENCOUNTER — Other Ambulatory Visit (HOSPITAL_COMMUNITY): Payer: Self-pay

## 2023-10-04 NOTE — Progress Notes (Unsigned)
    [name] [address] [place] [address] Phone: [phone]   Assessment and Plan:     There are no diagnoses linked to this encounter.  ***   Pertinent previous records reviewed include ***    Follow Up: ***     Subjective:   I, Ebony Rickel, am serving as a Neurosurgeon for Doctor Richardean Sale  Chief Complaint: neck pain   HPI:   10/05/2023 Patient is a 42 year old with neck pain. Patient states   Relevant Historical Information: ***  Additional pertinent review of systems negative.   Current Outpatient Medications:    albuterol (VENTOLIN HFA) 108 (90 Base) MCG/ACT inhaler, Inhale 1-2 puffs into the lungs every 6 (six) hours as needed for wheezing or shortness of breath., Disp: 6.7 g, Rfl: 11   Fexofenadine HCl (ALLEGRA PO), Take by mouth., Disp: , Rfl:    fluticasone (FLOVENT HFA) 110 MCG/ACT inhaler, Inhale 1 puff into the lungs in the morning and at bedtime., Disp: 12 g, Rfl: 11   hydrocortisone-pramoxine (ANALPRAM-HC) 2.5-1 % rectal cream, Place 1 Application rectally 3 (three) times daily for 7 days, Disp: 30 g, Rfl: 3   hydrOXYzine (ATARAX) 25 MG tablet, Take 1 tablet (25 mg total) daily as needed for anxiety. Take 2 tablets (50 mg total) by mouth at bedtime as needed for sleep, Disp: 30 tablet, Rfl: 2   Multiple Vitamin (MULTIVITAMIN) tablet, Take 1 tablet by mouth daily., Disp: , Rfl:    mupirocin ointment (BACTROBAN) 2 %, Apply 1 Application topically 2 (two) times daily., Disp: 22 g, Rfl: 11   SUMAtriptan (IMITREX) 50 MG tablet, Take 1 tablet (50 mg total) by mouth daily as needed for migraine. May repeat in 1-2 hours if headache persists or recurs., Disp: 10 tablet, Rfl: 3   venlafaxine XR (EFFEXOR-XR) 150 MG 24 hr capsule, Take 1 capsule (150 mg total) by mouth daily., Disp: 60 capsule, Rfl: 0   Objective:     There were no vitals filed for this visit.    There is no height or weight on file to  calculate BMI.    Physical Exam:    ***   Electronically signed by:  [name] 11:57 AM 10/04/23

## 2023-10-05 ENCOUNTER — Ambulatory Visit (INDEPENDENT_AMBULATORY_CARE_PROVIDER_SITE_OTHER): Payer: 59

## 2023-10-05 ENCOUNTER — Ambulatory Visit (INDEPENDENT_AMBULATORY_CARE_PROVIDER_SITE_OTHER): Payer: 59 | Admitting: Sports Medicine

## 2023-10-05 ENCOUNTER — Other Ambulatory Visit (HOSPITAL_COMMUNITY): Payer: Self-pay

## 2023-10-05 VITALS — BP 126/78 | HR 70 | Ht 71.0 in | Wt 354.0 lb

## 2023-10-05 DIAGNOSIS — F331 Major depressive disorder, recurrent, moderate: Secondary | ICD-10-CM | POA: Diagnosis not present

## 2023-10-05 DIAGNOSIS — F411 Generalized anxiety disorder: Secondary | ICD-10-CM | POA: Diagnosis not present

## 2023-10-05 DIAGNOSIS — G8929 Other chronic pain: Secondary | ICD-10-CM | POA: Diagnosis not present

## 2023-10-05 DIAGNOSIS — M542 Cervicalgia: Secondary | ICD-10-CM | POA: Diagnosis not present

## 2023-10-05 MED ORDER — VENLAFAXINE HCL ER 75 MG PO CP24
75.0000 mg | ORAL_CAPSULE | Freq: Every day | ORAL | 0 refills | Status: DC
  Filled 2023-10-05: qty 7, 7d supply, fill #0

## 2023-10-05 MED ORDER — MELOXICAM 15 MG PO TABS
15.0000 mg | ORAL_TABLET | Freq: Every day | ORAL | 0 refills | Status: DC
  Filled 2023-10-05: qty 30, 30d supply, fill #0

## 2023-10-05 MED ORDER — VILAZODONE HCL 20 MG PO TABS
20.0000 mg | ORAL_TABLET | Freq: Every day | ORAL | 0 refills | Status: DC
  Filled 2023-10-05: qty 30, 30d supply, fill #0

## 2023-10-05 NOTE — Patient Instructions (Addendum)
-   Start meloxicam 15 mg daily x2 weeks.  If still having pain after 2 weeks, complete 3rd-week of NSAID. May use remaining NSAID as needed once daily for pain control.  Do not to use additional over-the-counter NSAIDs (ibuprofen, naproxen, Advil, Aleve) while taking prescription NSAIDs.  May use Tylenol [phone] mg 2 to 3 times a day for breakthrough pain. Neck HEP  Can start PT  4 week follow up  Look for ergonomic changes at work to decrease neck strain

## 2023-10-06 ENCOUNTER — Ambulatory Visit: Payer: 59 | Admitting: Physician Assistant

## 2023-10-06 DIAGNOSIS — F331 Major depressive disorder, recurrent, moderate: Secondary | ICD-10-CM | POA: Diagnosis not present

## 2023-10-06 DIAGNOSIS — F411 Generalized anxiety disorder: Secondary | ICD-10-CM | POA: Diagnosis not present

## 2023-10-12 ENCOUNTER — Encounter (INDEPENDENT_AMBULATORY_CARE_PROVIDER_SITE_OTHER): Payer: Self-pay | Admitting: Family Medicine

## 2023-10-12 ENCOUNTER — Ambulatory Visit (INDEPENDENT_AMBULATORY_CARE_PROVIDER_SITE_OTHER): Payer: 59 | Admitting: Family Medicine

## 2023-10-12 VITALS — BP 127/74 | HR 73 | Temp 97.9°F | Ht 71.0 in | Wt 346.0 lb

## 2023-10-12 DIAGNOSIS — Z6841 Body Mass Index (BMI) 40.0 and over, adult: Secondary | ICD-10-CM | POA: Diagnosis not present

## 2023-10-12 DIAGNOSIS — M5412 Radiculopathy, cervical region: Secondary | ICD-10-CM

## 2023-10-12 DIAGNOSIS — E669 Obesity, unspecified: Secondary | ICD-10-CM

## 2023-10-12 DIAGNOSIS — E538 Deficiency of other specified B group vitamins: Secondary | ICD-10-CM | POA: Diagnosis not present

## 2023-10-12 DIAGNOSIS — E88819 Insulin resistance, unspecified: Secondary | ICD-10-CM

## 2023-10-12 DIAGNOSIS — F3289 Other specified depressive episodes: Secondary | ICD-10-CM

## 2023-10-12 DIAGNOSIS — E559 Vitamin D deficiency, unspecified: Secondary | ICD-10-CM | POA: Diagnosis not present

## 2023-10-12 DIAGNOSIS — E7849 Other hyperlipidemia: Secondary | ICD-10-CM

## 2023-10-12 DIAGNOSIS — E785 Hyperlipidemia, unspecified: Secondary | ICD-10-CM | POA: Diagnosis not present

## 2023-10-12 DIAGNOSIS — F329 Major depressive disorder, single episode, unspecified: Secondary | ICD-10-CM

## 2023-10-12 MED ORDER — NALTREXONE HCL 50 MG PO TABS: 50.0000 mg | ORAL_TABLET | Freq: Every day | ORAL | 0 refills | Status: DC

## 2023-10-12 NOTE — Progress Notes (Signed)
.smr  Office: [phone]  /  Fax: [phone]  WEIGHT SUMMARY AND BIOMETRICS  Anthropometric Measurements Height: 5\' 11"  (1.803 m) Weight: (!) 346 lb (156.9 kg) BMI (Calculated): 48.28 Weight at Last Visit: 338 lb Weight Lost Since Last Visit: 0 Weight Gained Since Last Visit: 8 lb Starting Weight: 311 lb Total Weight Loss (lbs): 0 lb (0 kg)   Body Composition  Body Fat %: 42.4 % Fat Mass (lbs): 146.8 lbs Muscle Mass (lbs): 189.8 lbs Total Body Water (lbs): 144.4 lbs Visceral Fat Rating : 28   Other Clinical Data Fasting: Yes Labs: Yes Today's Visit #: 37 Starting Date: 06/25/21    Chief Complaint: OBESITY   History of Present Illness   The patient, with a history of obesity, presents with concerns about recent weight gain. Over the past month, including the Thanksgiving holiday, he has gained eight pounds. He has been attempting to manage his weight through dietary changes, maintaining a food journal with a goal of 1500 calories and 85 grams of protein per day, which he estimates he adheres to about 70% of the time. He has also incorporated exercise into his routine, performing a combination of cardio and strength training for 60 minutes, three times a week.  In addition to his weight concerns, the patient has been experiencing neck pain, which has been ongoing for some time but has recently worsened. He describes severe pains that originate in the neck and radiate up to the head, with shooting pains behind the eyes. The pain is often triggered by certain neck positions, such as when waking up. He has sought consultation from sports medicine for this issue and has been performing prescribed neck exercises and taking meloxicam for a week.  The patient also reports struggling with mood, currently in the process of transitioning from one depression medication (Effexor) to another (Viibryd). He describes previous experiences with the new medication as inducing feelings of  mania, but is uncertain if this was a true manic state or simply a contrast to his usual mood.  The patient has built a home gym and follows a push-pull-leg routine for weightlifting. He has also been trying to increase his protein intake, often exceeding his goal. However, he expresses frustration with maintaining his dietary and exercise routines, particularly during the work week and weekends. He notes that he does well when in control of his environment at home, but struggles with temptation and boredom at work and during weekends. He is considering medication options to assist with weight management.          PHYSICAL EXAM:  Blood pressure 127/74, pulse 73, temperature 97.9 F (36.6 C), height 5\' 11"  (1.803 m), weight (!) 346 lb (156.9 kg), SpO2 98%. Body mass index is 48.26 kg/m.  DIAGNOSTIC DATA REVIEWED:  BMET    Component Value Date/Time   NA 137 06/01/2023 1347   NA 141 09/15/2022 1025   K 4.4 06/01/2023 1347   CL 105 06/01/2023 1347   CO2 23 06/01/2023 1347   GLUCOSE 94 06/01/2023 1347   BUN 12 06/01/2023 1347   BUN 17 09/15/2022 1025   CREATININE 0.73 06/01/2023 1347   CALCIUM 8.8 06/01/2023 1347   GFRNONAA >60 06/05/2021 1542   GFRAA 119 03/19/2020 0913   Lab Results  Component Value Date   HGBA1C 5.1 09/15/2022   HGBA1C 5.0 06/25/2021   Lab Results  Component Value Date   INSULIN 5.9 09/15/2022   INSULIN 6.5 06/25/2021   Lab Results  Component Value Date  TSH 2.00 06/01/2023   CBC    Component Value Date/Time   WBC 6.4 03/17/2022 0940   WBC 7.0 06/05/2021 1542   RBC 5.20 03/17/2022 0940   RBC 4.91 06/05/2021 1542   HGB 15.3 03/17/2022 0940   HCT 45.7 03/17/2022 0940   PLT 200 03/17/2022 0940   MCV 88 03/17/2022 0940   MCH 29.4 03/17/2022 0940   MCH 27.7 06/05/2021 1542   MCHC 33.5 03/17/2022 0940   MCHC 32.1 06/05/2021 1542   RDW 12.7 03/17/2022 0940   Iron Studies    Component Value Date/Time   IRON 61 11/26/2021 0832   TIBC 373  11/26/2021 0832   FERRITIN 29 (L) 11/26/2021 0832   IRONPCTSAT 16 11/26/2021 0832   Lipid Panel     Component Value Date/Time   CHOL 189 06/01/2023 1347   CHOL 184 09/15/2022 1025   TRIG 77 06/01/2023 1347   HDL 51 06/01/2023 1347   HDL 58 09/15/2022 1025   CHOLHDL 3.7 06/01/2023 1347   LDLCALC 121 (H) 06/01/2023 1347   Hepatic Function Panel     Component Value Date/Time   PROT 6.5 06/01/2023 1347   PROT 6.7 09/15/2022 1025   ALBUMIN 4.3 09/15/2022 1025   AST 22 06/01/2023 1347   ALT 30 06/01/2023 1347   ALKPHOS 79 09/15/2022 1025   BILITOT 0.5 06/01/2023 1347   BILITOT 0.5 09/15/2022 1025      Component Value Date/Time   TSH 2.00 06/01/2023 1347   Nutritional Lab Results  Component Value Date   VD25OH 48 06/01/2023   VD25OH 44.3 09/15/2022   VD25OH 59.7 03/17/2022     Assessment and Plan    Obesity Gained eight pounds in the last month, likely due to holiday eating and work-related temptations. Following a 1500 calorie per day diet with a goal of 85 grams of protein per day, achieving this about 70% of the time. Engaging in cardio and strengthening exercises for 60 minutes three times per week. Discussed potential use of naltrexone to help reduce cravings and emotional eating. Explained that naltrexone helps reduce cravings by blocking opioid receptors, making indulgence less tempting. Most people tolerate it well, but rapid dose increase can cause nausea. If opioids are needed, naltrexone must be stopped at least one week prior. - Prescribe naltrexone - Send prescription to [facility] - Continue current diet and exercise regimen - Recheck weight and dietary adherence in January  Cervical Radiculopathy Chronic neck stiffness with recent exacerbation of pain radiating to the head and behind the eyes. Symptoms aggravated by activities such as wearing lead aprons at work. Performing neck exercises and taking meloxicam with some improvement.  Upcoming physical therapy appointment in January. - Continue neck exercises - Attend physical therapy appointment in January  Major Depressive Disorder Transitioning from Effexor to Viibryd. Previously experienced feelings of mania with Viibryd but willing to try it again. Discussed that Effexor is challenging to discontinue, but Viibryd has shown positive outcomes in other patients. - Continue tapering off Effexor - Initiate Viibryd - Monitor mood and side effects closely - Follow up on medication efficacy and tolerability  Hyperlipidemia LDL cholesterol slightly elevated at 121 mg/dL. Not currently on any medication for cholesterol. - Recheck cholesterol levels - Discuss dietary modifications to lower LDL  Vitamin deficiencies Due for routine lab work including a comprehensive metabolic panel (CMP), vitamin D, B12, A1c, and insulin levels. Vitamin D previously at 48 ng/mL, currently taking a 50,000 IU supplement along with a daily  multivitamin. - Order CMP, vitamin D, B12, A1c, and insulin levels - Continue current vitamin D supplementation  Follow-up - Confirm January appointment - Schedule February appointment.         I have personally spent 40 minutes total time today in preparation, patient care, and documentation for this visit, including the following: review of clinical lab tests; review of medical tests/procedures/services.    Jill Alexanders was informed of the importance of frequent follow up visits to maximize [name]'s success with intensive lifestyle modifications for [name]'s multiple health conditions.    Quillian Quince, MD

## 2023-10-13 ENCOUNTER — Encounter (INDEPENDENT_AMBULATORY_CARE_PROVIDER_SITE_OTHER): Payer: Self-pay | Admitting: Family Medicine

## 2023-10-14 ENCOUNTER — Telehealth: Payer: 59 | Admitting: Physician Assistant

## 2023-10-14 DIAGNOSIS — F331 Major depressive disorder, recurrent, moderate: Secondary | ICD-10-CM | POA: Diagnosis not present

## 2023-10-14 DIAGNOSIS — A084 Viral intestinal infection, unspecified: Secondary | ICD-10-CM | POA: Diagnosis not present

## 2023-10-14 DIAGNOSIS — F411 Generalized anxiety disorder: Secondary | ICD-10-CM | POA: Diagnosis not present

## 2023-10-14 LAB — LIPID PANEL WITH LDL/HDL RATIO
Cholesterol, Total: 190 mg/dL (ref 100–199)
HDL: 58 mg/dL (ref >39)
LDL Chol Calc (NIH): 123 mg/dL — ABNORMAL HIGH (ref 0–99)
LDL/HDL Ratio: 2.1 {ratio} (ref 0.0–3.6)
Triglycerides: 47 mg/dL (ref 0–149)
VLDL Cholesterol Cal: 9 mg/dL (ref 5–40)

## 2023-10-14 LAB — CMP14+EGFR
ALT: 33 [IU]/L (ref 0–44)
AST: 28 [IU]/L (ref 0–40)
Albumin: 4.2 g/dL (ref 4.1–5.1)
Alkaline Phosphatase: 93 [IU]/L (ref 44–121)
BUN/Creatinine Ratio: 17 (ref 9–20)
BUN: 14 mg/dL (ref 6–24)
Bilirubin Total: 0.4 mg/dL (ref 0.0–1.2)
CO2: 20 mmol/L (ref 20–29)
Calcium: 9.2 mg/dL (ref 8.7–10.2)
Chloride: 104 mmol/L (ref 96–106)
Creatinine, Ser: 0.82 mg/dL (ref 0.76–1.27)
Globulin, Total: 2.4 g/dL (ref 1.5–4.5)
Glucose: 85 mg/dL (ref 70–99)
Potassium: 4.6 mmol/L (ref 3.5–5.2)
Sodium: 139 mmol/L (ref 134–144)
Total Protein: 6.6 g/dL (ref 6.0–8.5)
eGFR: 114 mL/min/{1.73_m2} (ref >59)

## 2023-10-14 LAB — HEMOGLOBIN A1C
Est. average glucose Bld gHb Est-mCnc: 103 mg/dL
Hgb A1c MFr Bld: 5.2 % (ref 4.8–5.6)

## 2023-10-14 LAB — VITAMIN B12: Vitamin B-12: 513 pg/mL (ref 232–1245)

## 2023-10-14 LAB — VITAMIN D 25 HYDROXY (VIT D DEFICIENCY, FRACTURES): Vit D, 25-Hydroxy: 36.9 ng/mL (ref 30.0–100.0)

## 2023-10-14 LAB — INSULIN, RANDOM: INSULIN: 9.2 u[IU]/mL (ref 2.6–24.9)

## 2023-10-14 MED ORDER — ONDANSETRON 4 MG PO TBDP: 4.0000 mg | ORAL_TABLET | Freq: Three times a day (TID) | ORAL | 0 refills | Status: AC | PRN

## 2023-10-14 NOTE — Progress Notes (Signed)
I have spent 5 minutes in review of e-visit questionnaire, review and updating patient chart, medical decision making and response to patient.   Mia Milan Cody Jacklynn Dehaas, PA-C    

## 2023-10-14 NOTE — Progress Notes (Signed)

## 2023-10-19 ENCOUNTER — Telehealth: Payer: 59 | Admitting: Physician Assistant

## 2023-10-19 ENCOUNTER — Ambulatory Visit (INDEPENDENT_AMBULATORY_CARE_PROVIDER_SITE_OTHER): Payer: 59

## 2023-10-19 ENCOUNTER — Ambulatory Visit
Admission: EM | Admit: 2023-10-19 | Discharge: 2023-10-19 | Disposition: A | Discharge status: Home / Self Care | Payer: 59 | Attending: Emergency Medicine | Admitting: Emergency Medicine

## 2023-10-19 DIAGNOSIS — R051 Acute cough: Secondary | ICD-10-CM

## 2023-10-19 DIAGNOSIS — J189 Pneumonia, unspecified organism: Secondary | ICD-10-CM

## 2023-10-19 DIAGNOSIS — R059 Cough, unspecified: Secondary | ICD-10-CM | POA: Diagnosis not present

## 2023-10-19 DIAGNOSIS — R112 Nausea with vomiting, unspecified: Secondary | ICD-10-CM | POA: Diagnosis not present

## 2023-10-19 DIAGNOSIS — R509 Fever, unspecified: Secondary | ICD-10-CM | POA: Diagnosis not present

## 2023-10-19 MED ORDER — DOXYCYCLINE HYCLATE 100 MG PO CAPS: 100.0000 mg | ORAL_CAPSULE | Freq: Two times a day (BID) | ORAL | 0 refills | Status: AC

## 2023-10-19 MED ORDER — ACETAMINOPHEN 325 MG PO TABS
975.0000 mg | ORAL_TABLET | Freq: Once | ORAL | Status: AC
  Administered 2023-10-19: 975 mg via ORAL

## 2023-10-19 MED ORDER — PROMETHAZINE-DM 6.25-15 MG/5ML PO SYRP: 5.0000 mL | ORAL_SOLUTION | Freq: Four times a day (QID) | ORAL | 0 refills | Status: DC | PRN

## 2023-10-19 MED ORDER — BENZONATATE 100 MG PO CAPS: 100.0000 mg | ORAL_CAPSULE | Freq: Three times a day (TID) | ORAL | 0 refills | Status: DC | PRN

## 2023-10-19 NOTE — ED Triage Notes (Signed)
Pt presents to UC for c/o nausea and vomiting for 2 days last week. Pt states this resolved, then 5 days ago started coughing and having fevers. Pt reports he coughed up blood yesterday. Taking tylenol and ibuprofen for fever, delsym for cough.

## 2023-10-19 NOTE — ED Provider Notes (Addendum)
[facility]    CSN: [id number] Arrival date & time: 10/19/23  0820     History   Chief Complaint Chief Complaint  Patient presents with   Cough    HPI Alex [name] is a 42 y.o. adult.  5 day history of fever and cough Tmax 102 lat night  Yesterday had blood tinge to mucous when coughing  Denies shortness of breath and chest pain. Has tried ibuprofen and delsym Last antipyretic was last night   Also had stomach bug last week that resolved Not having NVD or abd pain currently  Works in [employer], several sick contacts  Past Medical History:  Diagnosis Date   Anxiety    Arterial embolism of left leg (HCC)    Asthma    exercise-induced   Constipation    Depression    Infertility male    Palpitation    SOB (shortness of breath)    Vitamin D deficiency     Patient Active Problem List   Diagnosis Date Noted   Other constipation 03/16/2023   Obesity, Beginning BMI 44.62 01/05/2023   BMI 45.0-49.9, adult (HCC) 12/08/2022   Obesity, Beginning BMI 44.62 12/08/2022   Depression 07/02/2022   Other hyperlipidemia 03/17/2022   Vitamin D deficiency 03/17/2022   Insulin resistance 03/17/2022   Vitamin B12 deficiency 03/17/2022   Major depressive disorder, recurrent, moderate (HCC) 01/07/2022   Generalized anxiety disorder 01/07/2022   Diarrhea 12/22/2021   Hemorrhage of rectum and anus 12/22/2021   MDD (major depressive disorder), single episode, severe , no psychosis (HCC)    MDD (major depressive disorder), recurrent severe, without psychosis (HCC) 12/13/2017   Class 3 severe obesity with serious comorbidity and body mass index (BMI) of 40.0 to 44.9 in adult (HCC) 03/04/2016   White coat syndrome without diagnosis of hypertension 03/04/2016   Snoring 01/14/2016    Past Surgical History:  Procedure Laterality Date   COLONOSCOPY  2017   bleeding - negative scope   WISDOM TOOTH EXTRACTION         Home Medications    Prior to Admission medications    Medication Sig Start Date End Date Taking? Authorizing Provider  benzonatate (TESSALON) 100 MG capsule Take 1 capsule (100 mg total) by mouth 3 (three) times daily as needed for cough. 10/19/23  Yes Teara Duerksen, Lurena Joiner, PA-C  doxycycline (VIBRAMYCIN) 100 MG capsule Take 1 capsule (100 mg total) by mouth 2 (two) times daily for 5 days. 10/19/23 10/24/23 Yes Milus Fritze, Lurena Joiner, PA-C  promethazine-dextromethorphan (PROMETHAZINE-DM) 6.25-15 MG/5ML syrup Take 5 mLs by mouth 4 (four) times daily as needed for cough. 10/19/23  Yes Bonna Steury, Lurena Joiner, PA-C  albuterol (VENTOLIN HFA) 108 (90 Base) MCG/ACT inhaler Inhale 1-2 puffs into the lungs every 6 (six) hours as needed for wheezing or shortness of breath. 10/01/22   Allwardt, Crist Infante, PA-C  Fexofenadine HCl (ALLEGRA PO) Take by mouth.    [provider]  fluticasone (FLOVENT HFA) 110 MCG/ACT inhaler Inhale 1 puff into the lungs in the morning and at bedtime. 08/27/23   Charlott Holler, MD  hydrocortisone-pramoxine [facility]) 2.5-1 % rectal cream Place 1 Application rectally 3 (three) times daily for 7 days 03/10/23     hydrOXYzine (ATARAX) 25 MG tablet Take 1 tablet (25 mg total) daily as needed for anxiety. Take 2 tablets (50 mg total) by mouth at bedtime as needed for sleep 11/25/21     meloxicam (MOBIC) 15 MG tablet Take 1 tablet (15 mg total) by mouth daily.  10/05/23   Richardean Sale, DO  Multiple Vitamin (MULTIVITAMIN) tablet Take 1 tablet by mouth daily.    [provider]  mupirocin ointment (BACTROBAN) 2 % Apply 1 Application topically 2 (two) times daily. 11/12/22   Allwardt, Crist Infante, PA-C  naltrexone (DEPADE) 50 MG tablet Take 1 tablet (50 mg total) by mouth daily. 10/12/23   Quillian Quince D, MD  ondansetron (ZOFRAN-ODT) 4 MG disintegrating tablet Take 1 tablet (4 mg total) by mouth every 8 (eight) hours as needed for nausea or vomiting. 10/14/23   Waldon Merl, PA-C  SUMAtriptan (IMITREX) 50 MG tablet Take 1 tablet (50 mg  total) by mouth daily as needed for migraine. May repeat in 1-2 hours if headache persists or recurs. 09/28/23   Allwardt, Crist Infante, PA-C  venlafaxine XR (EFFEXOR-XR) 150 MG 24 hr capsule Take 1 capsule (150 mg total) by mouth daily. 08/10/23     venlafaxine XR (EFFEXOR-XR) 75 MG 24 hr capsule Take 1 capsule (75 mg total) by mouth daily. 10/05/23     Vilazodone HCl 20 MG TABS Take 1 tablet (20 mg total) by mouth daily. 10/05/23       Family History Family History  Problem Relation Age of Onset   Anxiety disorder Mother    Depression Mother    Obesity Mother    Obesity Father    Hyperlipidemia Father    Hypertension Father    Depression Father    Anxiety disorder Father    Asthma Father    Cancer Maternal Grandfather        skin & lung   Emphysema Maternal Grandfather    Cancer Paternal Grandfather        leukemia    Social History Social History   Tobacco Use   Smoking status: Never   Smokeless tobacco: Never  Vaping Use   Vaping status: Never Used  Substance Use Topics   Alcohol use: Not Currently    Comment: occasional   Drug use: No     Allergies   Patient has no known allergies.   Review of Systems Review of Systems  Respiratory:  Positive for cough.    Per HPI  Physical Exam Triage Vital Signs ED Triage Vitals  Encounter Vitals Group     BP 10/19/23 0852 119/81     Systolic BP Percentile --      Diastolic BP Percentile --      Pulse Rate 10/19/23 0852 89     Resp 10/19/23 0852 18     Temp 10/19/23 0852 99 F (37.2 C)     Temp Source 10/19/23 0852 Oral     SpO2 10/19/23 0852 97 %     Weight --      Height --      Head Circumference --      Peak Flow --      Pain Score 10/19/23 0848 0     Pain Loc --      Pain Education --      Exclude from Growth Chart --    No data found.  Updated Vital Signs BP 119/81 (BP Location: Right Arm)   Pulse 99   Temp 100.2 F (37.9 C) (Oral)   Resp 18   SpO2 94% Comment: cold fingers   Physical  Exam Vitals and nursing note reviewed.  HENT:     Nose: Congestion present. No rhinorrhea.     Mouth/Throat:     Mouth: Mucous membranes are moist.  Pharynx: Oropharynx is clear. No posterior oropharyngeal erythema.  Eyes:     Conjunctiva/sclera: Conjunctivae normal.  Cardiovascular:     Rate and Rhythm: Normal rate and regular rhythm.     Pulses: Normal pulses.     Heart sounds: Normal heart sounds.  Pulmonary:     Effort: Pulmonary effort is normal.     Breath sounds: Normal breath sounds.     Comments: Crackles right middle lobe. Frequent cough in clinic  Musculoskeletal:     Cervical back: Normal range of motion.  Lymphadenopathy:     Cervical: No cervical adenopathy.  Skin:    General: Skin is warm and dry.  Neurological:     Mental Status: Alex [name] is alert and oriented to person, place, and time.    UC Treatments / Results  Labs (all labs ordered are listed, but only abnormal results are displayed) Labs Reviewed - No data to display  EKG   Radiology DG Chest 2 View Result Date: 10/19/2023 CLINICAL DATA:  Cough with fever for 5 days. Nausea and vomiting for 2 days last week. EXAM: CHEST - 2 VIEW COMPARISON:  Radiographs 06/05/2021.  Cardiac CT 06/16/2021. FINDINGS: There is new focal consolidation inferiorly in the right upper lobe consistent with pneumonia. There is generalized central airway thickening without other focal airspace disease. No pleural effusion or pneumothorax. Heart size and mediastinal contours are stable. The bones appear unremarkable. IMPRESSION: Right upper lobe pneumonia. Radiographic follow-up recommended to ensure resolution. Electronically Signed   By: Carey Bullocks M.D.   On: 10/19/2023 09:53    Procedures Procedures (including critical care time)  Medications Ordered in UC Medications  acetaminophen (TYLENOL) tablet 975 mg (975 mg Oral Given 10/19/23 0939)    Initial Impression / Assessment and Plan / UC Course  I have reviewed  the triage vital signs and the nursing notes.  Pertinent labs & imaging results that were available during my care of the patient were reviewed by me and considered in my medical decision making (see chart for details).  Oxygen initially 92 but improved to 96% after warming fingers Temp 100.2 Tylenol dose given  Chest xray with right upper lobe infiltrate Doxycycline BID x 5 Promethazine and/or tessalon Discussed repeat imaging to ensure resolution; patient reports has a pulmonologist who can monitor. Other return and ED precautions  Final Clinical Impressions(s) / UC Diagnoses   Final diagnoses:  Acute cough  Fever, unspecified  Pneumonia of right upper lobe due to infectious organism     Discharge Instructions      I am treating you for pneumonia Please take medication as prescribed. Take with food to avoid upset stomach. Continue tylenol/ibuprofen as needed for fever  The promethazine DM cough syrup can be used up to 4 times daily. If this medication makes you drowsy, take only once before bed. The tessalon cough pills can be taken 3x daily.   Please follow up with primary care provider or urgent care in about 3-4 weeks to ensure resolution of pneumonia.      ED Prescriptions     Medication Sig Dispense Auth. Provider   doxycycline (VIBRAMYCIN) 100 MG capsule Take 1 capsule (100 mg total) by mouth 2 (two) times daily for 5 days. 10 capsule Jorja Empie, PA-C   promethazine-dextromethorphan (PROMETHAZINE-DM) 6.25-15 MG/5ML syrup Take 5 mLs by mouth 4 (four) times daily as needed for cough. 240 mL Hailly Fess, PA-C   benzonatate (TESSALON) 100 MG capsule Take 1 capsule (100 mg total) by mouth 3 (  three) times daily as needed for cough. 30 capsule Danni Shima, Lurena Joiner, PA-C      PDMP not reviewed this encounter.      Marlow Baars, [place] 10/19/23 1035

## 2023-10-19 NOTE — Discharge Instructions (Signed)
I am treating you for pneumonia Please take medication as prescribed. Take with food to avoid upset stomach. Continue tylenol/ibuprofen as needed for fever  The promethazine DM cough syrup can be used up to 4 times daily. If this medication makes you drowsy, take only once before bed. The tessalon cough pills can be taken 3x daily.   Please follow up with primary care provider or urgent care in about 3-4 weeks to ensure resolution of pneumonia.

## 2023-10-19 NOTE — Progress Notes (Signed)
Because of continued fever and worsening symptoms with blood tinged sputum and need for exam and possible x-ray, I feel your condition warrants further evaluation and I recommend that you be seen in a face to face visit.   NOTE: There will be NO CHARGE for this eVisit   If you are having a true medical emergency please call 911.      For an urgent face to face visit, [facility] has eight urgent care centers for your convenience:   NEW!! [facility] Center at [facility] Get Driving Directions [phone] [address], [address] [place], [address]    [facility] Health Urgent Care Center at [facility] Get Driving Directions [phone] [address] [place], [place] [address]   [facility] Health Urgent Care Center [facility]) Get Driving Directions [phone] [address] [place], [place] [address]  [facility] Health Urgent Care Center [facility]) Get Driving Directions [phone] [address] [place],  [place]  [address]  [facility] Health Urgent Care Center [facility] - at [employer]  [phone] [address] [place],  [place] [address]   [facility]gent Care at [facility] Get Driving Directions [phone] [address] [place], [place] [address]   [facility] Health Urgent Care at [facility] Get Driving Directions  [phone] [address] [place], [place] [address]   [facility] at [facility] Directions [phone] [address], [address] [place], [place] [address]  Your MyChart E-visit questionnaire answers were reviewed by a board certified advanced clinical practitioner to complete your personal care plan based on your specific symptoms.  Thank you for using e-Visits.

## 2023-10-24 ENCOUNTER — Encounter: Payer: Self-pay | Admitting: Physician Assistant

## 2023-10-25 NOTE — Telephone Encounter (Signed)
Please see patient message and advise anything further needed

## 2023-10-25 NOTE — Telephone Encounter (Signed)
Please see pt response.

## 2023-10-28 DIAGNOSIS — F411 Generalized anxiety disorder: Secondary | ICD-10-CM | POA: Diagnosis not present

## 2023-10-28 DIAGNOSIS — F331 Major depressive disorder, recurrent, moderate: Secondary | ICD-10-CM | POA: Diagnosis not present

## 2023-11-01 NOTE — Progress Notes (Signed)
 Ben Jiya Kissinger D.[facility]MENT[facility] [address] [place] [address] Phone: [phone]   Assessment and Plan:     1. Neck pain 2. Cervicalgia  -Chronic with exacerbation, subsequent visit - Still most consistent with cervical paraspinal and trapezius muscular pain likely caused by stress and work.  Patient was have improvement in symptoms with course of meloxicam  and HEP, however symptoms have recently flared up the past 2 weeks with pneumonia diagnosis - Recommend using Tylenol  for day-to-day pain relief - Discontinue meloxicam  15 mg daily.  May start meloxicam  15 mg daily as needed for breakthrough pain.  Recommend limiting chronic NSAIDs to 1-2 doses per week - Continue HEP and start physical therapy.  Patient's first PT appointment is this afternoon  Pertinent previous records reviewed include none  Follow Up: 4 to 6 weeks for reevaluation.  Could consider OMT versus prednisone course versus advanced imaging   Subjective:   I, Moenique Parris, am serving as a neurosurgeon for Doctor Morene Mace   Chief Complaint: neck pain    HPI:    10/05/2023 Patient is a 43 year old with neck pain. Patient states that pain has increased over the past couple of months. Works in [employer]. Decreased ROM. Does endorse numbness down to the fingers. Pain does radiate up to his head and down to his shoulders. Was getting stabbing pain behind the eyes. Does endorse intermittent vertigo. 3 weeks ago got a massage and that has helped with symptoms. No meds for the pain. Meloxicam  for another aliment and that helped with the pain.    11/02/2023 Patient states been sick so has been coughing a lot so flared a little. But was improving    Relevant Historical Information: None pertinent  Additional pertinent review of systems negative.   Current Outpatient Medications:    albuterol  (VENTOLIN  HFA) 108 (90 Base) MCG/ACT inhaler, Inhale 1-2 puffs into the lungs every  6 (six) hours as needed for wheezing or shortness of breath., Disp: 6.7 g, Rfl: 11   benzonatate  (TESSALON ) 100 MG capsule, Take 1 capsule (100 mg total) by mouth 3 (three) times daily as needed for cough., Disp: 30 capsule, Rfl: 0   Fexofenadine HCl (ALLEGRA PO), Take by mouth., Disp: , Rfl:    fluticasone  (FLOVENT  HFA) 110 MCG/ACT inhaler, Inhale 1 puff into the lungs in the morning and at bedtime., Disp: 12 g, Rfl: 11   hydrocortisone -pramoxine (ANALPRAM -HC) 2.5-1 % rectal cream, Place 1 Application rectally 3 (three) times daily for 7 days, Disp: 30 g, Rfl: 3   hydrOXYzine  (ATARAX ) 25 MG tablet, Take 1 tablet (25 mg total) daily as needed for anxiety. Take 2 tablets (50 mg total) by mouth at bedtime as needed for sleep, Disp: 30 tablet, Rfl: 2   meloxicam  (MOBIC ) 15 MG tablet, Take 1 tablet (15 mg total) by mouth daily., Disp: 30 tablet, Rfl: 0   Multiple Vitamin (MULTIVITAMIN) tablet, Take 1 tablet by mouth daily., Disp: , Rfl:    mupirocin  ointment (BACTROBAN ) 2 %, Apply 1 Application topically 2 (two) times daily., Disp: 22 g, Rfl: 11   naltrexone  (DEPADE) 50 MG tablet, Take 1 tablet (50 mg total) by mouth daily., Disp: 30 tablet, Rfl: 0   ondansetron  (ZOFRAN -ODT) 4 MG disintegrating tablet, Take 1 tablet (4 mg total) by mouth every 8 (eight) hours as needed for nausea or vomiting., Disp: 20 tablet, Rfl: 0   promethazine -dextromethorphan (PROMETHAZINE -DM) 6.25-15 MG/5ML syrup, Take 5 mLs by mouth 4 (four) times  daily as needed for cough., Disp: 240 mL, Rfl: 0   SUMAtriptan  (IMITREX ) 50 MG tablet, Take 1 tablet (50 mg total) by mouth daily as needed for migraine. May repeat in 1-2 hours if headache persists or recurs., Disp: 10 tablet, Rfl: 3   venlafaxine  XR (EFFEXOR -XR) 150 MG 24 hr capsule, Take 1 capsule (150 mg total) by mouth daily., Disp: 60 capsule, Rfl: 0   venlafaxine  XR (EFFEXOR -XR) 75 MG 24 hr capsule, Take 1 capsule (75 mg total) by mouth daily., Disp: 7 capsule, Rfl: 0   Vilazodone   HCl 20 MG TABS, Take 1 tablet (20 mg total) by mouth daily., Disp: 30 tablet, Rfl: 0   Objective:     Vitals:   11/02/23 0848  BP: 120/82  Pulse: 94  SpO2: 99%  Weight: (!) 350 lb (158.8 kg)  Height: 5' 11 (1.803 m)      Body mass index is 48.82 kg/m.    Physical Exam:    Neck Exam: Cervical Spine- Posture normal Skin- normal, intact   Neuro:  Strength-   Right Left  Deltoid (C5) 5/5 5/5 Bicep/Brachioradialis (C5/6) 5/5  5/5 Wrist Extension (C6) 5/5 5/5 Tricep (C7) 5/5 5/5 Wrist Flexion (C7) 5/5 5/5 Grip (C8) 5/5 5/5 Finger Abduction (T1) 5/5 5/5   Sensation: intact to light touch in upper extremities bilaterally   Spurling's:  negative bilaterally Neck ROM: Left-sided neck tension with flexion, however full active ROM   NTTP: cervical spinous processes, cervical paraspinal, thoracic paraspinal, trapezius     Electronically signed by:  Odis Mace D.[facility] 9:08 AM 11/02/23

## 2023-11-02 ENCOUNTER — Ambulatory Visit: Payer: 59 | Admitting: Physical Therapy

## 2023-11-02 ENCOUNTER — Encounter: Payer: Self-pay | Admitting: Physical Therapy

## 2023-11-02 ENCOUNTER — Ambulatory Visit (INDEPENDENT_AMBULATORY_CARE_PROVIDER_SITE_OTHER): Payer: 59 | Admitting: Sports Medicine

## 2023-11-02 VITALS — BP 120/82 | HR 94 | Ht 71.0 in | Wt 350.0 lb

## 2023-11-02 DIAGNOSIS — M542 Cervicalgia: Secondary | ICD-10-CM | POA: Diagnosis not present

## 2023-11-02 NOTE — Patient Instructions (Signed)
4-6 week follow up

## 2023-11-02 NOTE — Therapy (Signed)
 OUTPATIENT PHYSICAL THERAPY UPPER EXTREMITY EVALUATION   Patient Name: Alex [name] MRN: [id number] DOB:[birth year], 43 y.o., adult Today's Date: 11/02/2023  END OF SESSION:  PT End of Session - 11/02/23 1345     Visit Number 1    Number of Visits 16    Date for PT Re-Evaluation 12/28/23    Authorization Type [facility]- [employer]- Save    PT Start Time 1345    PT Stop Time 1425    PT Time Calculation (min) 40 min    Activity Tolerance Patient tolerated treatment well    Behavior During Therapy [facility] for tasks assessed/performed             Past Medical History:  Diagnosis Date   Anxiety    Arterial embolism of left leg (HCC)    Asthma    exercise-induced   Constipation    Depression    Infertility male    Palpitation    SOB (shortness of breath)    Vitamin D  deficiency    Past Surgical History:  Procedure Laterality Date   COLONOSCOPY  2017   bleeding - negative scope   WISDOM TOOTH EXTRACTION     Patient Active Problem List   Diagnosis Date Noted   Other constipation 03/16/2023   Obesity, Beginning BMI 44.62 01/05/2023   BMI 45.0-49.9, adult (HCC) 12/08/2022   Obesity, Beginning BMI 44.62 12/08/2022   Depression 07/02/2022   Other hyperlipidemia 03/17/2022   Vitamin D  deficiency 03/17/2022   Insulin  resistance 03/17/2022   Vitamin B12 deficiency 03/17/2022   Major depressive disorder, recurrent, moderate (HCC) 01/07/2022   Generalized anxiety disorder 01/07/2022   Diarrhea 12/22/2021   Hemorrhage of rectum and anus 12/22/2021   MDD (major depressive disorder), single episode, severe , no psychosis (HCC)    MDD (major depressive disorder), recurrent severe, without psychosis (HCC) 12/13/2017   Class 3 severe obesity with serious comorbidity and body mass index (BMI) of 40.0 to 44.9 in adult (HCC) 03/04/2016   White coat syndrome without diagnosis of hypertension 03/04/2016   Snoring 01/14/2016    PCP: Mardy Allwardt  REFERRING PROVIDER: Mardy  Allwardt  REFERRING DIAG: Cervical Radiculopathy  THERAPY DIAG:  Cervicalgia  Rationale for Evaluation and Treatment: Rehabilitation  ONSET DATE: 6 mo ago  SUBJECTIVE:                                                                                                                                                                                      SUBJECTIVE STATEMENT: Pt has pain that started Several months ago, has been more consistent in the last couple months.  He was having significant pain, tingling into hands, and  some headaches. He works as Scientist, [employer], 12 hour shifts.  States pain and tingling has improved, now feeling it maybe 2-3 times/wk. Was  happening mostly in the morning, but also feeling during the week.  Pt also recently had pneumonia, has some lingering symptoms, but doing better.  NO steroid, taking meloxicam  as needed.  Hand dominance: Ambidextrous  PERTINENT HISTORY: none   PAIN:  Are you having pain? Yes: NPRS scale: 2-5 /10 Pain location: neck , into bil UEs.  Pain description: pain, tingling into hands  Aggravating factors: 1st thing in AM, during work day. Relieving factors: change of position, posture   PRECAUTIONS: None  RED FLAGS: None   WEIGHT BEARING RESTRICTIONS: No  FALLS:  Has patient fallen in last 6 months? No   PLOF: Independent  PATIENT GOALS: Decreased pain  NEXT MD VISIT:   OBJECTIVE:   DIAGNOSTIC FINDINGS:    PATIENT SURVEYS :  FOTO : 63.5  COGNITION: Overall cognitive status: Within functional limits for tasks assessed     SENSATION: WFL  POSTURE: fwd head   UPPER EXTREMITY ROM:   Shoulder ROM: WFL Neck ROM: WFL   UPPER EXTREMITY MMT:  MMT Right eval Left eval  Shoulder flexion 4 4  Shoulder extension    Shoulder abduction 4 4  Shoulder adduction    Shoulder internal rotation    Shoulder external rotation    Middle trapezius    Lower trapezius    Elbow flexion    Elbow extension    Wrist flexion     Wrist extension    Wrist ulnar deviation    Wrist radial deviation    Wrist pronation    Wrist supination    Grip strength (lbs)    (Blank rows = not tested)  SHOULDER SPECIAL TESTS:  JOINT MOBILITY TESTING:    PALPATION:  Tenderness in bil mid and upper c-spine paraspinals , much tenderness into sub occipitals.     TODAY'S TREATMENT:                                                                                                                                         DATE:   11/02/2023 Therapeutic Exercise: Aerobic: Supine: chin tucks x 10  Seated:   Chin tucks x 10  Standing: scap squeeze x 15,  shoulder rolls bwd x 15; Cervical rotation L/R x 10;  Stretches:  Neuromuscular Re-education: Manual Therapy: STM bil mid/upper cervical paraspinals, SOR, light cervical distraction  Therapeutic Activity: Self Care:   PATIENT EDUCATION:  Education details: PT POC, Exam findings, HEP Person educated: Patient Education method: Explanation, Demonstration, Tactile cues, Verbal cues, and Handouts Education comprehension: verbalized understanding, returned demonstration, verbal cues required, tactile cues required, and needs further education   HOME EXERCISE PROGRAM: Access Code: [email or web address] URL: [email or web address] Date: 11/03/2023 Prepared by: Tinnie Don  Exercises - Supine Cervical Retraction with Towel  - 1-2 x  daily - 1 sets - 10 reps - 5 hold - Standing Scapular Retraction  - 2-3 x daily - 1 sets - 10 reps - Seated Cervical Rotation AROM  - 2-3 x daily - 1 sets - 5- 10 reps - Standing Backward Shoulder Rolls  - 2-3 x daily - 1 sets - 5- 10 reps    ASSESSMENT:  CLINICAL IMPRESSION: Eval:  Patient presents with primary complaint of pain in cervical region and into bil UEs. He has most pain and tenderness in sub occipital region today. He has tendency for fwd head posture, and will benefit from education on posture with work and functional activities.  He will also benefit from strengthening for postural muscles for improving ability for work duties. Pt with decreased ability for full functional activities, reaching, lifting, carrying, and IADLs. Pt to benefit from skilled PT to improve deficits and pain.    OBJECTIVE IMPAIRMENTS: decreased activity tolerance, decreased mobility, decreased strength, increased muscle spasms, impaired UE functional use, improper body mechanics, and pain.   ACTIVITY LIMITATIONS: carrying, lifting, bending, sleeping, reach over head, hygiene/grooming, and locomotion level  PARTICIPATION LIMITATIONS: meal prep, cleaning, shopping, community activity, occupation, and yard work  PERSONAL FACTORS: Time since onset of injury/illness/exacerbation are also affecting patient's functional outcome.   REHAB POTENTIAL: Good  CLINICAL DECISION MAKING: Stable/uncomplicated  EVALUATION COMPLEXITY: Low  GOALS: Goals reviewed with patient? Yes   SHORT TERM GOALS: Target date: 11/23/2023  Pt to be intendment with initial HEP  Goal status: INITIAL  2.  Pt to report compliance with postural exercises during the work day.   Goal status: INITIAL    LONG TERM GOALS: Target date: 12/28/2023   Pt to be independent with final HEP  Goal status: INITIAL  2.  Pt to demo improved  pain in neck and into UE s to 0-2/10 with full work day.   Goal status: INITIAL  3.  Pt to demo improved strength of shoulders and postural muscles to be at least 4/5, to improve ability for work duties .   Goal status: INITIAL    PLAN: PT FREQUENCY: 1-2x/week  PT DURATION: 8 weeks  PLANNED INTERVENTIONS: Therapeutic exercises, Therapeutic activity, Neuromuscular re-education, Patient/Family education, Self Care, Joint mobilization, Joint manipulation, Stair training, DME instructions, Aquatic Therapy, Dry Needling, Electrical stimulation, Cryotherapy, Moist heat, Taping, Ultrasound, Ionotophoresis 4mg /ml Dexamethasone, Manual therapy,   Vasopneumatic device, Traction, Spinal manipulation, Spinal mobilization,   PLAN FOR NEXT SESSION:    Tinnie Don, PT, DPT 3:39 PM  11/03/23

## 2023-11-09 ENCOUNTER — Other Ambulatory Visit (HOSPITAL_COMMUNITY): Payer: Self-pay

## 2023-11-09 ENCOUNTER — Encounter (INDEPENDENT_AMBULATORY_CARE_PROVIDER_SITE_OTHER): Payer: Self-pay | Admitting: Family Medicine

## 2023-11-09 DIAGNOSIS — F331 Major depressive disorder, recurrent, moderate: Secondary | ICD-10-CM | POA: Diagnosis not present

## 2023-11-09 DIAGNOSIS — F411 Generalized anxiety disorder: Secondary | ICD-10-CM | POA: Diagnosis not present

## 2023-11-09 MED ORDER — VILAZODONE HCL 20 MG PO TABS
20.0000 mg | ORAL_TABLET | Freq: Every day | ORAL | 0 refills | Status: DC
  Filled 2023-11-09: qty 30, 30d supply, fill #0

## 2023-11-10 ENCOUNTER — Ambulatory Visit: Payer: 59 | Admitting: Physician Assistant

## 2023-11-11 DIAGNOSIS — F331 Major depressive disorder, recurrent, moderate: Secondary | ICD-10-CM | POA: Diagnosis not present

## 2023-11-11 DIAGNOSIS — F411 Generalized anxiety disorder: Secondary | ICD-10-CM | POA: Diagnosis not present

## 2023-11-16 ENCOUNTER — Ambulatory Visit (INDEPENDENT_AMBULATORY_CARE_PROVIDER_SITE_OTHER): Payer: 59 | Admitting: Physical Therapy

## 2023-11-16 ENCOUNTER — Encounter: Payer: Self-pay | Admitting: Physical Therapy

## 2023-11-16 ENCOUNTER — Encounter: Payer: Self-pay | Admitting: Physician Assistant

## 2023-11-16 DIAGNOSIS — M542 Cervicalgia: Secondary | ICD-10-CM

## 2023-11-16 NOTE — Telephone Encounter (Signed)
Please see pt concerns not mentioned during last OV and advise

## 2023-11-16 NOTE — Therapy (Signed)
OUTPATIENT PHYSICAL THERAPY UPPER EXTREMITY TREATMENT   Patient Name: KRISTER SHIRE MRN: [id number] DOB:1981/05/19, 43 y.o., adult Today's Date: 11/02/2023  END OF SESSION:  PT End of Session - 11/16/23 0854     Visit Number 2    Number of Visits 16    Date for PT Re-Evaluation 12/28/23    Authorization Type [facility]- [employer]- Save    PT Start Time [phone]    PT Stop Time 0926    PT Time Calculation (min) 39 min    Activity Tolerance Patient tolerated treatment well    Behavior During Therapy [facility] for tasks assessed/performed              Past Medical History:  Diagnosis Date   Anxiety    Arterial embolism of left leg (HCC)    Asthma    exercise-induced   Constipation    Depression    Infertility male    Palpitation    SOB (shortness of breath)    Vitamin D deficiency    Past Surgical History:  Procedure Laterality Date   COLONOSCOPY  2017   bleeding - negative scope   WISDOM TOOTH EXTRACTION     Patient Active Problem List   Diagnosis Date Noted   Other constipation 03/16/2023   Obesity, Beginning BMI 44.62 01/05/2023   BMI 45.0-49.9, adult (HCC) 12/08/2022   Obesity, Beginning BMI 44.62 12/08/2022   Depression 07/02/2022   Other hyperlipidemia 03/17/2022   Vitamin D deficiency 03/17/2022   Insulin resistance 03/17/2022   Vitamin B12 deficiency 03/17/2022   Major depressive disorder, recurrent, moderate (HCC) 01/07/2022   Generalized anxiety disorder 01/07/2022   Diarrhea 12/22/2021   Hemorrhage of rectum and anus 12/22/2021   MDD (major depressive disorder), single episode, severe , no psychosis (HCC)    MDD (major depressive disorder), recurrent severe, without psychosis (HCC) 12/13/2017   Class 3 severe obesity with serious comorbidity and body mass index (BMI) of 40.0 to 44.9 in adult (HCC) 03/04/2016   White coat syndrome without diagnosis of hypertension 03/04/2016   Snoring 01/14/2016    PCP: Arlyss Repress Allwardt  REFERRING PROVIDER: Arlyss Repress  Allwardt  REFERRING DIAG: Cervical Radiculopathy  THERAPY DIAG:  Cervicalgia  Rationale for Evaluation and Treatment: Rehabilitation  ONSET DATE: 6 mo ago  SUBJECTIVE:                                                                                                                                                                                      SUBJECTIVE STATEMENT:  11/16/23  Not much is new since last time, pain is starting to get better in general. Need to get back on the bandwagon with  HEP this week. Following the PNA I've had this fullness and numbness in my ear, no dizziness but sounds are muffled. Meloxicam and HEP have helped, feels like a nagging sports injury more than anything      EVAL: Pt has pain that started Several months ago, has been more consistent in the last couple months.  He was having significant pain, tingling into hands, and some headaches. He works as Scientist, forensic, 12 hour shifts.  States pain and tingling has improved, now feeling it maybe 2-3 times/wk. Was  happening mostly in the morning, but also feeling during the week.  Pt also recently had pneumonia, has some lingering symptoms, but doing better.  NO steroid, taking meloxicam as needed.  Hand dominance: Ambidextrous  PERTINENT HISTORY: none   PAIN:  Are you having pain? Yes: NPRS scale: 0/10 now, at worst in past week 3/10 Pain location: suboccipals  Pain description: nagging, tender to the touch  Aggravating factors: positional/poor mechanics  Relieving factors: correcting position  PRECAUTIONS: None  RED FLAGS: None   WEIGHT BEARING RESTRICTIONS: No  FALLS:  Has patient fallen in last 6 months? No   PLOF: Independent  PATIENT GOALS: Decreased pain  NEXT MD VISIT:   OBJECTIVE:   DIAGNOSTIC FINDINGS:    PATIENT SURVEYS :  FOTO : 63.5  COGNITION: Overall cognitive status: Within functional limits for tasks assessed     SENSATION: WFL  POSTURE: fwd head   UPPER  EXTREMITY ROM:   Shoulder ROM: WFL Neck ROM: WFL   UPPER EXTREMITY MMT:  MMT Right eval Left eval  Shoulder flexion 4 4  Shoulder extension    Shoulder abduction 4 4  Shoulder adduction    Shoulder internal rotation    Shoulder external rotation    Middle trapezius    Lower trapezius    Elbow flexion    Elbow extension    Wrist flexion    Wrist extension    Wrist ulnar deviation    Wrist radial deviation    Wrist pronation    Wrist supination    Grip strength (lbs)    (Blank rows = not tested)  SHOULDER SPECIAL TESTS:  JOINT MOBILITY TESTING:    PALPATION:  Tenderness in bil mid and upper c-spine paraspinals , much tenderness into sub occipitals.     TODAY'S TREATMENT:                                                                                                                                         DATE:   11/16/23  TherEx  Aerobic: Supine:  Seated: chin tucks 12x3 seconds,  chin tucks + rotation x10 B, chin tucks + lateral flexion x10 Standing: scap retractions red TB 12x3 seconds, shoulder extensions red TB 12x3 second holds  Stretches: SCM stretches 2x30 seconds B, scalene stretches 2x30 seconds B, UT stretch 1x30 seconds B, levator stretch 1x30 seconds  Neuromuscular Re-education: Manual Therapy: suboccipital release  Therapeutic Activity: Self Care:    11/02/2023 Therapeutic Exercise: Aerobic: Supine: chin tucks x 10  Seated:   Chin tucks x 10  Standing: scap squeeze x 15,  shoulder rolls bwd x 15; Cervical rotation L/R x 10;  Stretches:  Neuromuscular Re-education: Manual Therapy: STM bil mid/upper cervical paraspinals, SOR, light cervical distraction  Therapeutic Activity: Self Care:   PATIENT EDUCATION:  Education details: PT POC, Exam findings, HEP Person educated: Patient Education method: Explanation, Demonstration, Tactile cues, Verbal cues, and Handouts Education comprehension: verbalized understanding, returned demonstration,  verbal cues required, tactile cues required, and needs further education   HOME EXERCISE PROGRAM: Access Code: [email or web address] URL: [email or web address] Date: 11/03/2023 Prepared by: Sedalia Muta  Exercises - Supine Cervical Retraction with Towel  - 1-2 x daily - 1 sets - 10 reps - 5 hold - Standing Scapular Retraction  - 2-3 x daily - 1 sets - 10 reps - Seated Cervical Rotation AROM  - 2-3 x daily - 1 sets - 5- 10 reps - Standing Backward Shoulder Rolls  - 2-3 x daily - 1 sets - 5- 10 reps    ASSESSMENT:  CLINICAL IMPRESSION:  11/16/23  Pt arrives today doing well, sounds like combo of mm relaxers and HEP has really been helping. We progressed HEP as tolerated today with good tolerance noted, also responded well to manual techniques. Awareness of general posture and mechanics is improving really well, and habitual postural corrections at work seem to be making a big difference.       Eval:  Patient presents with primary complaint of pain in cervical region and into bil UEs. He has most pain and tenderness in sub occipital region today. He has tendency for fwd head posture, and will benefit from education on posture with work and functional activities. He will also benefit from strengthening for postural muscles for improving ability for work duties. Pt with decreased ability for full functional activities, reaching, lifting, carrying, and IADLs. Pt to benefit from skilled PT to improve deficits and pain.    OBJECTIVE IMPAIRMENTS: decreased activity tolerance, decreased mobility, decreased strength, increased muscle spasms, impaired UE functional use, improper body mechanics, and pain.   ACTIVITY LIMITATIONS: carrying, lifting, bending, sleeping, reach over head, hygiene/grooming, and locomotion level  PARTICIPATION LIMITATIONS: meal prep, cleaning, shopping, community activity, occupation, and yard work  PERSONAL FACTORS: Time since onset of  injury/illness/exacerbation are also affecting patient's functional outcome.   REHAB POTENTIAL: Good  CLINICAL DECISION MAKING: Stable/uncomplicated  EVALUATION COMPLEXITY: Low  GOALS: Goals reviewed with patient? Yes   SHORT TERM GOALS: Target date: 11/23/2023  Pt to be intendment with initial HEP  Goal status: INITIAL  2.  Pt to report compliance with postural exercises during the work day.   Goal status: INITIAL    LONG TERM GOALS: Target date: 12/28/2023   Pt to be independent with final HEP  Goal status: INITIAL  2.  Pt to demo improved  pain in neck and into UE s to 0-2/10 with full work day.   Goal status: INITIAL  3.  Pt to demo improved strength of shoulders and postural muscles to be at least 4/5, to improve ability for work duties .   Goal status: INITIAL    PLAN: PT FREQUENCY: 1-2x/week  PT DURATION: 8 weeks  PLANNED INTERVENTIONS: Therapeutic exercises, Therapeutic activity, Neuromuscular re-education, Patient/Family education, Self Care, Joint mobilization, Joint manipulation, Stair training, DME instructions, Aquatic Therapy, Dry Needling, Electrical stimulation, Cryotherapy,  Moist heat, Taping, Ultrasound, Ionotophoresis 4mg /ml Dexamethasone, Manual therapy,  Vasopneumatic device, Traction, Spinal manipulation, Spinal mobilization,   PLAN FOR NEXT SESSION: HEP updates PRN, postural training, could consider suboccipital DN?    Nedra Hai, PT, DPT 11/16/23 9:26 AM

## 2023-11-17 ENCOUNTER — Other Ambulatory Visit (HOSPITAL_COMMUNITY): Payer: Self-pay

## 2023-11-17 DIAGNOSIS — F411 Generalized anxiety disorder: Secondary | ICD-10-CM | POA: Diagnosis not present

## 2023-11-17 DIAGNOSIS — F50021 Anorexia nervosa, binge eating/purging type, moderate: Secondary | ICD-10-CM | POA: Diagnosis not present

## 2023-11-17 DIAGNOSIS — F331 Major depressive disorder, recurrent, moderate: Secondary | ICD-10-CM | POA: Diagnosis not present

## 2023-11-17 MED ORDER — LISDEXAMFETAMINE DIMESYLATE 20 MG PO CAPS
20.0000 mg | ORAL_CAPSULE | Freq: Every morning | ORAL | 0 refills | Status: DC
  Filled 2023-11-17: qty 15, 15d supply, fill #0

## 2023-11-18 ENCOUNTER — Encounter: Payer: Self-pay | Admitting: Physical Therapy

## 2023-11-18 ENCOUNTER — Ambulatory Visit: Payer: 59 | Admitting: Physical Therapy

## 2023-11-18 DIAGNOSIS — M542 Cervicalgia: Secondary | ICD-10-CM | POA: Diagnosis not present

## 2023-11-18 NOTE — Therapy (Signed)
OUTPATIENT PHYSICAL THERAPY UPPER EXTREMITY TREATMENT   Patient Name: Alex [name] MRN: [id number] DOB:[birth year], 43 y.o., adult Today's Date: 11/02/2023  END OF SESSION:  PT End of Session - 11/18/23 0802     Visit Number 3    Number of Visits 16    Date for PT Re-Evaluation 12/28/23    Authorization Type [facility]- [employer]- Save    PT Start Time [phone]   MHP not included in billing   PT Stop Time 0843    PT Time Calculation (min) 35 min    Activity Tolerance Patient tolerated treatment well    Behavior During Therapy [facility] for tasks assessed/performed               Past Medical History:  Diagnosis Date   Anxiety    Arterial embolism of left leg (HCC)    Asthma    exercise-induced   Constipation    Depression    Infertility male    Palpitation    SOB (shortness of breath)    Vitamin D deficiency    Past Surgical History:  Procedure Laterality Date   COLONOSCOPY  2017   bleeding - negative scope   WISDOM TOOTH EXTRACTION     Patient Active Problem List   Diagnosis Date Noted   Other constipation 03/16/2023   Obesity, Beginning BMI 44.62 01/05/2023   BMI 45.0-49.9, adult (HCC) 12/08/2022   Obesity, Beginning BMI 44.62 12/08/2022   Depression 07/02/2022   Other hyperlipidemia 03/17/2022   Vitamin D deficiency 03/17/2022   Insulin resistance 03/17/2022   Vitamin B12 deficiency 03/17/2022   Major depressive disorder, recurrent, moderate (HCC) 01/07/2022   Generalized anxiety disorder 01/07/2022   Diarrhea 12/22/2021   Hemorrhage of rectum and anus 12/22/2021   MDD (major depressive disorder), single episode, severe , no psychosis (HCC)    MDD (major depressive disorder), recurrent severe, without psychosis (HCC) 12/13/2017   Class 3 severe obesity with serious comorbidity and body mass index (BMI) of 40.0 to 44.9 in adult (HCC) 03/04/2016   White coat syndrome without diagnosis of hypertension 03/04/2016   Snoring 01/14/2016    PCP: Arlyss Repress  Allwardt  REFERRING PROVIDER: Arlyss Repress Allwardt  REFERRING DIAG: Cervical Radiculopathy  THERAPY DIAG:  Cervicalgia  Rationale for Evaluation and Treatment: Rehabilitation  ONSET DATE: 6 mo ago  SUBJECTIVE:                                                                                                                                                                                      SUBJECTIVE STATEMENT:  11/18/23  Feeling good this morning, was a little sore from last time but it was like a  workout soreness not like a pain     EVAL: Pt has pain that started Several months ago, has been more consistent in the last couple months.  He was having significant pain, tingling into hands, and some headaches. He works as Scientist, forensic, 12 hour shifts.  States pain and tingling has improved, now feeling it maybe 2-3 times/wk. Was  happening mostly in the morning, but also feeling during the week.  Pt also recently had pneumonia, has some lingering symptoms, but doing better.  NO steroid, taking meloxicam as needed.  Hand dominance: Ambidextrous  PERTINENT HISTORY: none   PAIN:  Are you having pain? Yes: NPRS scale: 1/10 Pain location: suboccipitals L>R Pain description: sore and stiff Aggravating factors: morning stiffness Relieving factors: heat, improves by itself throughout the day  PRECAUTIONS: None  RED FLAGS: None   WEIGHT BEARING RESTRICTIONS: No  FALLS:  Has patient fallen in last 6 months? No   PLOF: Independent  PATIENT GOALS: Decreased pain  NEXT MD VISIT:   OBJECTIVE:   DIAGNOSTIC FINDINGS:    PATIENT SURVEYS :  FOTO : 63.5  COGNITION: Overall cognitive status: Within functional limits for tasks assessed     SENSATION: WFL  POSTURE: fwd head   UPPER EXTREMITY ROM:   Shoulder ROM: WFL Neck ROM: WFL   UPPER EXTREMITY MMT:  MMT Right eval Left eval  Shoulder flexion 4 4  Shoulder extension    Shoulder abduction 4 4  Shoulder adduction     Shoulder internal rotation    Shoulder external rotation    Middle trapezius    Lower trapezius    Elbow flexion    Elbow extension    Wrist flexion    Wrist extension    Wrist ulnar deviation    Wrist radial deviation    Wrist pronation    Wrist supination    Grip strength (lbs)    (Blank rows = not tested)  SHOULDER SPECIAL TESTS:  JOINT MOBILITY TESTING:    PALPATION:  Tenderness in bil mid and upper c-spine paraspinals , much tenderness into sub occipitals.     TODAY'S TREATMENT:                                                                                                                                         DATE:   11/18/23  Aerobic: Supine:  Seated: chin tuck + rotation x10 B, chin tuck + lateral flexion x10 B, SCM stretch 1x30 seconds B, scalene stretch 1x30 seconds B, thoracic flexion/rotation stretch into thoracic extension x6 B Standing:  shoulder extensions green TB x12, scap retractions x12 green TB  Stretches:  Prone: blackburn 6 x10 each 0# Neuromuscular Re-education: Manual Therapy:   Therapeutic Activity: Self Care:   MHP x8 minutes cervical musculature and suboccipitals per pt request prior to exercise (not included in billing)         Previous:  Aerobic: Supine:  Seated: chin tucks 12x3 seconds,  chin tucks + rotation x10 B, chin tucks + lateral flexion x10 Standing: scap retractions red TB 12x3 seconds, shoulder extensions red TB 12x3 second holds  Stretches: SCM stretches 2x30 seconds B, scalene stretches 2x30 seconds B, UT stretch 1x30 seconds B, levator stretch 1x30 seconds  Neuromuscular Re-education: Manual Therapy: suboccipital release  Therapeutic Activity: Self Care:     PATIENT EDUCATION:  Education details: PT POC, Exam findings, HEP Person educated: Patient Education method: Explanation, Demonstration, Tactile cues, Verbal cues, and Handouts Education comprehension: verbalized understanding, returned  demonstration, verbal cues required, tactile cues required, and needs further education   HOME EXERCISE PROGRAM:  Access Code: [email or web address] URL: [email or web address] Date: 11/18/2023 Prepared by: Nedra Hai  Exercises - Supine Cervical Retraction with Towel  - 1-2 x daily - 1 sets - 10 reps - 5 hold - Standing Scapular Retraction  - 2-3 x daily - 1 sets - 10 reps - Seated Cervical Rotation AROM  - 2-3 x daily - 1 sets - 5- 10 reps - Standing Backward Shoulder Rolls  - 2-3 x daily - 1 sets - 5- 10 reps - Seated Cervical Retraction and Rotation  - 1-2 x daily - 7 x weekly - 1 sets - 10 reps - 3 seconds  hold - Standing Cervical Retraction with Sidebending  - 1-2 x daily - 7 x weekly - 1 sets - 10 reps - 3 seconds hold - Sternocleidomastoid Stretch  - 1-2 x daily - 7 x weekly - 1 sets - 2-3 reps - 30 seconds  hold - Seated Scalenes Stretch  - 1-2 x daily - 7 x weekly - 1 sets - 2-3 reps - 30 seconds  hold    ASSESSMENT:  CLINICAL IMPRESSION:  11/18/23  Pt arrives today doing really well, having a bit of extra soreness especially in L suboccipitals due to combo of early AM appt and stiffness from cold. Otherwise continued working on functional postural strengthening, also updated HEP as appropriate this session. Will continue as per POC, continue to feel that he would really benefit from dry needling to suboccipitals.      Eval:  Patient presents with primary complaint of pain in cervical region and into bil UEs. He has most pain and tenderness in sub occipital region today. He has tendency for fwd head posture, and will benefit from education on posture with work and functional activities. He will also benefit from strengthening for postural muscles for improving ability for work duties. Pt with decreased ability for full functional activities, reaching, lifting, carrying, and IADLs. Pt to benefit from skilled PT to improve deficits and pain.    OBJECTIVE IMPAIRMENTS:  decreased activity tolerance, decreased mobility, decreased strength, increased muscle spasms, impaired UE functional use, improper body mechanics, and pain.   ACTIVITY LIMITATIONS: carrying, lifting, bending, sleeping, reach over head, hygiene/grooming, and locomotion level  PARTICIPATION LIMITATIONS: meal prep, cleaning, shopping, community activity, occupation, and yard work  PERSONAL FACTORS: Time since onset of injury/illness/exacerbation are also affecting patient's functional outcome.   REHAB POTENTIAL: Good  CLINICAL DECISION MAKING: Stable/uncomplicated  EVALUATION COMPLEXITY: Low  GOALS: Goals reviewed with patient? Yes   SHORT TERM GOALS: Target date: 11/23/2023  Pt to be intendment with initial HEP  Goal status: INITIAL  2.  Pt to report compliance with postural exercises during the work day.   Goal status: INITIAL    LONG TERM GOALS: Target date: 12/28/2023   Pt to be independent with  final HEP  Goal status: INITIAL  2.  Pt to demo improved  pain in neck and into UE s to 0-2/10 with full work day.   Goal status: INITIAL  3.  Pt to demo improved strength of shoulders and postural muscles to be at least 4/5, to improve ability for work duties .   Goal status: INITIAL    PLAN: PT FREQUENCY: 1-2x/week  PT DURATION: 8 weeks  PLANNED INTERVENTIONS: Therapeutic exercises, Therapeutic activity, Neuromuscular re-education, Patient/Family education, Self Care, Joint mobilization, Joint manipulation, Stair training, DME instructions, Aquatic Therapy, Dry Needling, Electrical stimulation, Cryotherapy, Moist heat, Taping, Ultrasound, Ionotophoresis 4mg /ml Dexamethasone, Manual therapy,  Vasopneumatic device, Traction, Spinal manipulation, Spinal mobilization,   PLAN FOR NEXT SESSION: HEP updates PRN, postural training, consider dry needling next visit (pt agreeable)   Nedra Hai, PT, DPT 11/18/23 8:44 AM

## 2023-11-23 ENCOUNTER — Other Ambulatory Visit (HOSPITAL_COMMUNITY): Payer: Self-pay

## 2023-11-23 ENCOUNTER — Ambulatory Visit (INDEPENDENT_AMBULATORY_CARE_PROVIDER_SITE_OTHER): Payer: 59 | Admitting: Family Medicine

## 2023-11-23 ENCOUNTER — Encounter (INDEPENDENT_AMBULATORY_CARE_PROVIDER_SITE_OTHER): Payer: Self-pay | Admitting: Family Medicine

## 2023-11-23 VITALS — BP 115/64 | HR 83 | Temp 98.5°F | Ht 71.0 in | Wt 350.0 lb

## 2023-11-23 DIAGNOSIS — E669 Obesity, unspecified: Secondary | ICD-10-CM | POA: Diagnosis not present

## 2023-11-23 DIAGNOSIS — Z6841 Body Mass Index (BMI) 40.0 and over, adult: Secondary | ICD-10-CM | POA: Diagnosis not present

## 2023-11-23 DIAGNOSIS — E559 Vitamin D deficiency, unspecified: Secondary | ICD-10-CM | POA: Diagnosis not present

## 2023-11-23 DIAGNOSIS — F5089 Other specified eating disorder: Secondary | ICD-10-CM | POA: Diagnosis not present

## 2023-11-23 DIAGNOSIS — F902 Attention-deficit hyperactivity disorder, combined type: Secondary | ICD-10-CM

## 2023-11-23 DIAGNOSIS — F3289 Other specified depressive episodes: Secondary | ICD-10-CM

## 2023-11-23 MED ORDER — VITAMIN D (ERGOCALCIFEROL) 1.25 MG (50000 UNIT) PO CAPS
50000.0000 [IU] | ORAL_CAPSULE | ORAL | 0 refills | Status: DC
  Filled 2023-11-23: qty 4, 28d supply, fill #0

## 2023-11-23 MED ORDER — NALTREXONE HCL 50 MG PO TABS
50.0000 mg | ORAL_TABLET | Freq: Every day | ORAL | 0 refills | Status: DC
  Filled 2023-11-23: qty 30, 30d supply, fill #0

## 2023-11-23 NOTE — Progress Notes (Signed)
.smr  Office: [phone]  /  Fax: [phone]  WEIGHT SUMMARY AND BIOMETRICS  Anthropometric Measurements Height: 5\' 11"  (1.803 m) Weight: (!) 350 lb (158.8 kg) BMI (Calculated): 48.84 Weight at Last Visit: 346 lb Weight Lost Since Last Visit: 0 Weight Gained Since Last Visit: 4 lb Starting Weight: 311 lb Total Weight Loss (lbs): 0 lb (0 kg)   Body Composition  Body Fat %: 41.9 % Fat Mass (lbs): 146.8 lbs Muscle Mass (lbs): 193.6 lbs Total Body Water (lbs): 143 lbs Visceral Fat Rating : 28   Other Clinical Data Fasting: no Labs: no Today's Visit #: 38 Starting Date: 06/25/21    Chief Complaint: OBESITY    History of Present Illness   The patient presents with obesity and emotional eating behaviors.  He has experienced a weight gain of four pounds over the last five weeks, coinciding with the Christmas and New Year's holidays. He journals about 20% of the time with a calorie goal of 1500 and a protein goal of 85 or more grams per day. Emotional eating behaviors are noted. He is attempting to exercise, engaging in strength training and exercise for 30 minutes once a week.  He recently experienced pneumonia, which began with gastrointestinal symptoms followed by fever. Initially thought to be the flu, a common occurrence for him around Christmas, he coughed up bright red phlegm on Christmas Eve, prompting him to seek urgent care. He was out of work for one weekend and felt about 85% recovered approximately three weeks ago. The illness affected his energy levels and metabolism, impacting his ability to exercise and meal prep.  He has been prescribed Vyvanse for ADHD, which he started one week ago. He reports no negative side effects and notes feeling calmer and less anxious. The medication helps with work eating temptations and makes it easier to resist indulgences. He has also switched from Effexor to Viibryd about three weeks ago and reports a stable mood without  significant depressive episodes.  He is trying to incorporate weight training into his routine, typically working out three days a week before his illness. Currently, he manages two days of weight training and alternates between leg day and cardio on a bike. He is easing back into his routine after being sick, noting that last week was his first attempt to return to exercise.          PHYSICAL EXAM:  Blood pressure 115/64, pulse 83, temperature 98.5 F (36.9 C), height 5\' 11"  (1.803 m), weight (!) 350 lb (158.8 kg), SpO2 98%. Body mass index is 48.82 kg/m.  DIAGNOSTIC DATA REVIEWED:  BMET    Component Value Date/Time   NA 139 10/12/2023 0924   K 4.6 10/12/2023 0924   CL 104 10/12/2023 0924   CO2 20 10/12/2023 0924   GLUCOSE 85 10/12/2023 0924   GLUCOSE 94 06/01/2023 1347   BUN 14 10/12/2023 0924   CREATININE 0.82 10/12/2023 0924   CREATININE 0.73 06/01/2023 1347   CALCIUM 9.2 10/12/2023 0924   GFRNONAA >60 06/05/2021 1542   GFRAA 119 03/19/2020 0913   Lab Results  Component Value Date   HGBA1C 5.2 10/12/2023   HGBA1C 5.0 06/25/2021   Lab Results  Component Value Date   INSULIN 9.2 10/12/2023   INSULIN 6.5 06/25/2021   Lab Results  Component Value Date   TSH 2.00 06/01/2023   CBC    Component Value Date/Time   WBC 6.4 03/17/2022 0940   WBC 7.0 06/05/2021 1542   RBC 5.20 03/17/2022  0940   RBC 4.91 06/05/2021 1542   HGB 15.3 03/17/2022 0940   HCT 45.7 03/17/2022 0940   PLT 200 03/17/2022 0940   MCV 88 03/17/2022 0940   MCH 29.4 03/17/2022 0940   MCH 27.7 06/05/2021 1542   MCHC 33.5 03/17/2022 0940   MCHC 32.1 06/05/2021 1542   RDW 12.7 03/17/2022 0940   Iron Studies    Component Value Date/Time   IRON 61 11/26/2021 0832   TIBC 373 11/26/2021 0832   FERRITIN 29 (L) 11/26/2021 0832   IRONPCTSAT 16 11/26/2021 0832   Lipid Panel     Component Value Date/Time   CHOL 190 10/12/2023 0924   TRIG 47 10/12/2023 0924   HDL 58 10/12/2023 0924   CHOLHDL  3.7 06/01/2023 1347   LDLCALC 123 (H) 10/12/2023 0924   LDLCALC 121 (H) 06/01/2023 1347   Hepatic Function Panel     Component Value Date/Time   PROT 6.6 10/12/2023 0924   ALBUMIN 4.2 10/12/2023 0924   AST 28 10/12/2023 0924   ALT 33 10/12/2023 0924   ALKPHOS 93 10/12/2023 0924   BILITOT 0.4 10/12/2023 0924      Component Value Date/Time   TSH 2.00 06/01/2023 1347   Nutritional Lab Results  Component Value Date   VD25OH 36.9 10/12/2023   VD25OH 48 06/01/2023   VD25OH 44.3 09/15/2022     Assessment and Plan    ADHD Diagnosed with ADHD and started on Vyvanse one week ago. No significant side effects reported. Improvements noted in anxiety and impulse control. Blood pressure is 115/64. Discussed Vyvanse benefits for ADHD symptoms and weight management by reducing binge eating. Initial hand jitters likely due to missed medications, not Vyvanse. - Continue Vyvanse - Assess efficacy and tolerability at follow-up  Emotional Eating Behavior Binge eating episodes likely related to ADHD. Vyvanse helps manage these episodes by reducing temptations. Emphasized meal and snack planning to manage emotional eating. - Continue Vyvanse - Plan meals and snacks - Refill naltrexone today  Obesity Gained four pounds in the last five weeks, likely due to decreased activity and disrupted routine over the holidays and pneumonia recovery. Journaling dietary intake 20% of the time with a 1500 calorie and 85g protein goal. Exercising with 30 minutes of strength training once a week. Discussed pneumonia's impact on metabolism and energy levels, and the importance of resuming regular exercise and meal planning. - Continue dietary journaling - Maintain 1500 calorie and 85g protein goals - Increase exercise frequency as tolerated - Plan meals and snacks  Vitamin D Deficiency Vitamin D levels have dropped, and supplements are not being taken. Discussed the importance of maintaining adequate vitamin D  levels. - Refill vitamin D prescription  General Health Maintenance Recent labs show good kidney, liver, and electrolyte function. Cholesterol levels increased over the holidays, but triglycerides and HDL are normal. A1c is 5.2, and insulin levels are below 10. B12 levels are good. - Continue healthy diet and exercise routine - Follow up on lab results as needed  Follow-up - Schedule follow-up appointment for March.          Alex [name] was informed of the importance of frequent follow up visits to maximize Alex [name]'s success with intensive lifestyle modifications for Alex [name]'s multiple health conditions.    Quillian Quince, MD

## 2023-11-24 ENCOUNTER — Encounter: Payer: Self-pay | Admitting: Physical Therapy

## 2023-11-24 ENCOUNTER — Ambulatory Visit (INDEPENDENT_AMBULATORY_CARE_PROVIDER_SITE_OTHER): Payer: 59 | Admitting: Physical Therapy

## 2023-11-24 DIAGNOSIS — M542 Cervicalgia: Secondary | ICD-10-CM

## 2023-11-24 NOTE — Therapy (Signed)
OUTPATIENT PHYSICAL THERAPY UPPER EXTREMITY TREATMENT   Patient Name: Alex [name] MRN: [id number] DOB:04/19/1981, 43 y.o., adult Today's Date: 11/24/2023   END OF SESSION:  PT End of Session - 11/24/23 0935     Visit Number 4    Number of Visits 16    Date for PT Re-Evaluation 12/28/23    Authorization Type [facility]- [employer]- Save    PT Start Time [phone]    PT Stop Time 1010    PT Time Calculation (min) 33 min    Activity Tolerance Patient tolerated treatment well    Behavior During Therapy [facility] for tasks assessed/performed               Past Medical History:  Diagnosis Date   Anxiety    Arterial embolism of left leg (HCC)    Asthma    exercise-induced   Constipation    Depression    Infertility male    Palpitation    SOB (shortness of breath)    Vitamin D deficiency    Past Surgical History:  Procedure Laterality Date   COLONOSCOPY  2017   bleeding - negative scope   WISDOM TOOTH EXTRACTION     Patient Active Problem List   Diagnosis Date Noted   Attention deficit hyperactivity disorder (ADHD), combined type 11/23/2023   Other constipation 03/16/2023   Obesity, Beginning BMI 44.62 01/05/2023   BMI 45.0-49.9, adult (HCC) 12/08/2022   Obesity, Beginning BMI 44.62 12/08/2022   Depression 07/02/2022   Other hyperlipidemia 03/17/2022   Vitamin D deficiency 03/17/2022   Insulin resistance 03/17/2022   Vitamin B12 deficiency 03/17/2022   Major depressive disorder, recurrent, moderate (HCC) 01/07/2022   Generalized anxiety disorder 01/07/2022   Diarrhea 12/22/2021   Hemorrhage of rectum and anus 12/22/2021   MDD (major depressive disorder), single episode, severe , no psychosis (HCC)    MDD (major depressive disorder), recurrent severe, without psychosis (HCC) 12/13/2017   Class 3 severe obesity with serious comorbidity and body mass index (BMI) of 40.0 to 44.9 in adult (HCC) 03/04/2016   White coat syndrome without diagnosis of hypertension 03/04/2016    Snoring 01/14/2016    PCP: Arlyss Repress Allwardt  REFERRING PROVIDER: Arlyss Repress Allwardt  REFERRING DIAG: Cervical Radiculopathy  THERAPY DIAG:  Cervicalgia  Rationale for Evaluation and Treatment: Rehabilitation  ONSET DATE: 6 mo ago  SUBJECTIVE:                                                                                                                                                                                      SUBJECTIVE STATEMENT:  11/24/23 Headaches better, did have one yesterday but only a few hours. Neck  pain seems to be doing better. Still feeing stiff, in ams, feels exercises are helping.   EVAL: Pt has pain that started Several months ago, has been more consistent in the last couple months.  He was having significant pain, tingling into hands, and some headaches. He works as Scientist, forensic, 12 hour shifts.  States pain and tingling has improved, now feeling it maybe 2-3 times/wk. Was  happening mostly in the morning, but also feeling during the week.  Pt also recently had pneumonia, has some lingering symptoms, but doing better.  NO steroid, taking meloxicam as needed.  Hand dominance: Ambidextrous  PERTINENT HISTORY: none   PAIN:  Are you having pain? Yes: NPRS scale: 1/10 Pain location: suboccipitals L>R Pain description: sore and stiff Aggravating factors: morning stiffness Relieving factors: heat, improves by itself throughout the day  PRECAUTIONS: None  RED FLAGS: None   WEIGHT BEARING RESTRICTIONS: No  FALLS:  Has patient fallen in last 6 months? No   PLOF: Independent  PATIENT GOALS: Decreased pain  NEXT MD VISIT:   OBJECTIVE:   DIAGNOSTIC FINDINGS:    PATIENT SURVEYS :  FOTO : 63.5  COGNITION: Overall cognitive status: Within functional limits for tasks assessed     SENSATION: WFL  POSTURE: fwd head   UPPER EXTREMITY ROM:   Shoulder ROM: WFL Neck ROM: WFL   UPPER EXTREMITY MMT:  MMT Right eval Left eval  Shoulder  flexion 4 4  Shoulder extension    Shoulder abduction 4 4  Shoulder adduction    Shoulder internal rotation    Shoulder external rotation    Middle trapezius    Lower trapezius    Elbow flexion    Elbow extension    Wrist flexion    Wrist extension    Wrist ulnar deviation    Wrist radial deviation    Wrist pronation    Wrist supination    Grip strength (lbs)    (Blank rows = not tested)  SHOULDER SPECIAL TESTS:  JOINT MOBILITY TESTING:    PALPATION:  Tenderness in bil mid and upper c-spine paraspinals , much tenderness into sub occipitals.     TODAY'S TREATMENT:                                                                                                                                         DATE:   11/24/2023 Aerobic: Supine:  Seated: cervical rotation with overpressure 2 x 8 to R, x 8 to L,  Standing:  shoulder extensions Blue TB 2 x12,  Rows 2x12 BlueTB, reviewed other options/positions for scapular strengthening and lat pull downs,  Stretches:  Prone:  Neuromuscular Re-education: Manual Therapy:  STM/TPR to bil cervical paraspinals, SOR, Cervical manual traction; manual stretching for UT s and levator bil;  Therapeutic Activity: Self Care:     11/18/23  Aerobic: Supine:  Seated: chin tuck + rotation x10 B,  chin tuck + lateral flexion x10 B, SCM stretch 1x30 seconds B, scalene stretch 1x30 seconds B, thoracic flexion/rotation stretch into thoracic extension x6 B Standing:  shoulder extensions green TB x12, scap retractions x12 green TB  Stretches:  Prone: blackburn 6 x10 each 0# Neuromuscular Re-education: Manual Therapy:   Therapeutic Activity: Self Care:   MHP x8 minutes cervical musculature and suboccipitals per pt request prior to exercise (not included in billing)  Previous:  Aerobic: Supine:  Seated: chin tucks 12x3 seconds,  chin tucks + rotation x10 B, chin tucks + lateral flexion x10 Standing: scap retractions red TB 12x3 seconds,  shoulder extensions red TB 12x3 second holds  Stretches: SCM stretches 2x30 seconds B, scalene stretches 2x30 seconds B, UT stretch 1x30 seconds B, levator stretch 1x30 seconds  Neuromuscular Re-education: Manual Therapy: suboccipital release  Therapeutic Activity: Self Care:     PATIENT EDUCATION:  Education details: updated and reviewed HEP Person educated: Patient Education method: Explanation, Demonstration, Tactile cues, Verbal cues, and Handouts Education comprehension: verbalized understanding, returned demonstration, verbal cues required, tactile cues required, and needs further education   HOME EXERCISE PROGRAM:  Access Code: [email or web address] URL: [email or web address] Date: 11/18/2023 Prepared by: Nedra Hai    ASSESSMENT:  CLINICAL IMPRESSION:  11/24/23 Pt showing improvements in pain, has been diligent with HEP. Still haivng some stiffness/tightness in the AM. Movement and HEP helping to improve. Improved pain in L SO with repeated motions/rotations to R today. Good tolerance for manual with SOR and traction. Reviewed Scapular and postural strengthening. Pt to benefit from continued care.   Eval:  Patient presents with primary complaint of pain in cervical region and into bil UEs. He has most pain and tenderness in sub occipital region today. He has tendency for fwd head posture, and will benefit from education on posture with work and functional activities. He will also benefit from strengthening for postural muscles for improving ability for work duties. Pt with decreased ability for full functional activities, reaching, lifting, carrying, and IADLs. Pt to benefit from skilled PT to improve deficits and pain.    OBJECTIVE IMPAIRMENTS: decreased activity tolerance, decreased mobility, decreased strength, increased muscle spasms, impaired UE functional use, improper body mechanics, and pain.   ACTIVITY LIMITATIONS: carrying, lifting, bending, sleeping,  reach over head, hygiene/grooming, and locomotion level  PARTICIPATION LIMITATIONS: meal prep, cleaning, shopping, community activity, occupation, and yard work  PERSONAL FACTORS: Time since onset of injury/illness/exacerbation are also affecting patient's functional outcome.   REHAB POTENTIAL: Good  CLINICAL DECISION MAKING: Stable/uncomplicated  EVALUATION COMPLEXITY: Low  GOALS: Goals reviewed with patient? Yes   SHORT TERM GOALS: Target date: 11/23/2023  Pt to be intendment with initial HEP  Goal status: INITIAL  2.  Pt to report compliance with postural exercises during the work day.   Goal status: INITIAL    LONG TERM GOALS: Target date: 12/28/2023   Pt to be independent with final HEP  Goal status: INITIAL  2.  Pt to demo improved  pain in neck and into UE s to 0-2/10 with full work day.   Goal status: INITIAL  3.  Pt to demo improved strength of shoulders and postural muscles to be at least 4/5, to improve ability for work duties .   Goal status: INITIAL    PLAN: PT FREQUENCY: 1-2x/week  PT DURATION: 8 weeks  PLANNED INTERVENTIONS: Therapeutic exercises, Therapeutic activity, Neuromuscular re-education, Patient/Family education, Self Care, Joint mobilization, Joint manipulation, Stair training, DME instructions, Aquatic Therapy, Dry Needling, Electrical stimulation, Cryotherapy,  Moist heat, Taping, Ultrasound, Ionotophoresis 4mg /ml Dexamethasone, Manual therapy,  Vasopneumatic device, Traction, Spinal manipulation, Spinal mobilization,   PLAN FOR NEXT SESSION: HEP updates PRN, postural training, consider dry needling next visit (pt agreeable)   Sedalia Muta, PT, DPT 10:10 AM  11/24/23

## 2023-11-25 DIAGNOSIS — F331 Major depressive disorder, recurrent, moderate: Secondary | ICD-10-CM | POA: Diagnosis not present

## 2023-11-25 DIAGNOSIS — F50021 Anorexia nervosa, binge eating/purging type, moderate: Secondary | ICD-10-CM | POA: Diagnosis not present

## 2023-11-25 DIAGNOSIS — F411 Generalized anxiety disorder: Secondary | ICD-10-CM | POA: Diagnosis not present

## 2023-11-30 ENCOUNTER — Ambulatory Visit (INDEPENDENT_AMBULATORY_CARE_PROVIDER_SITE_OTHER): Payer: 59 | Admitting: Physical Therapy

## 2023-11-30 ENCOUNTER — Encounter: Payer: Self-pay | Admitting: Physical Therapy

## 2023-11-30 DIAGNOSIS — M542 Cervicalgia: Secondary | ICD-10-CM

## 2023-11-30 NOTE — Therapy (Signed)
 OUTPATIENT PHYSICAL THERAPY UPPER EXTREMITY TREATMENT   Patient Name: Alex [name] MRN: [id number] DOB:07-28-1981, 43 y.o., adult Today's Date: 11/30/2023   END OF SESSION:  PT End of Session - 11/30/23 0801     Visit Number 5    Number of Visits 16    Date for PT Re-Evaluation 12/28/23    Authorization Type [facility]- [employer]- Save    PT Start Time 0803    PT Stop Time 0845    PT Time Calculation (min) 42 min    Activity Tolerance Patient tolerated treatment well    Behavior During Therapy [facility] for tasks assessed/performed               Past Medical History:  Diagnosis Date   Anxiety    Arterial embolism of left leg (HCC)    Asthma    exercise-induced   Constipation    Depression    Infertility male    Palpitation    SOB (shortness of breath)    Vitamin D  deficiency    Past Surgical History:  Procedure Laterality Date   COLONOSCOPY  2017   bleeding - negative scope   WISDOM TOOTH EXTRACTION     Patient Active Problem List   Diagnosis Date Noted   Attention deficit hyperactivity disorder (ADHD), combined type 11/23/2023   Other constipation 03/16/2023   Obesity, Beginning BMI 44.62 01/05/2023   BMI 45.0-49.9, adult (HCC) 12/08/2022   Obesity, Beginning BMI 44.62 12/08/2022   Depression 07/02/2022   Other hyperlipidemia 03/17/2022   Vitamin D  deficiency 03/17/2022   Insulin  resistance 03/17/2022   Vitamin B12 deficiency 03/17/2022   Major depressive disorder, recurrent, moderate (HCC) 01/07/2022   Generalized anxiety disorder 01/07/2022   Diarrhea 12/22/2021   Hemorrhage of rectum and anus 12/22/2021   MDD (major depressive disorder), single episode, severe , no psychosis (HCC)    MDD (major depressive disorder), recurrent severe, without psychosis (HCC) 12/13/2017   Class 3 severe obesity with serious comorbidity and body mass index (BMI) of 40.0 to 44.9 in adult (HCC) 03/04/2016   White coat syndrome without diagnosis of hypertension 03/04/2016    Snoring 01/14/2016    PCP: Mardy Allwardt  REFERRING PROVIDER: Mardy Allwardt  REFERRING DIAG: Cervical Radiculopathy  THERAPY DIAG:  Cervicalgia  Rationale for Evaluation and Treatment: Rehabilitation  ONSET DATE: 6 mo ago  SUBJECTIVE:                                                                                                                                                                                      SUBJECTIVE STATEMENT:  11/30/23 Pt states doing quite a bit better. States some stiffness this am  from sleeping last night. Doing better during work day.    EVAL: Pt has pain that started Several months ago, has been more consistent in the last couple months.  He was having significant pain, tingling into hands, and some headaches. He works as Scientist, [employer], 12 hour shifts.  States pain and tingling has improved, now feeling it maybe 2-3 times/wk. Was  happening mostly in the morning, but also feeling during the week.  Pt also recently had pneumonia, has some lingering symptoms, but doing better.  NO steroid, taking meloxicam  as needed.  Hand dominance: Ambidextrous  PERTINENT HISTORY: none   PAIN:  Are you having pain? Yes: NPRS scale: 1/10 Pain location: suboccipitals L>R Pain description: sore and stiff Aggravating factors: morning stiffness Relieving factors: heat, improves by itself throughout the day  PRECAUTIONS: None  RED FLAGS: None   WEIGHT BEARING RESTRICTIONS: No  FALLS:  Has patient fallen in last 6 months? No   PLOF: Independent  PATIENT GOALS: Decreased pain  NEXT MD VISIT:   OBJECTIVE:   DIAGNOSTIC FINDINGS:    PATIENT SURVEYS :  FOTO : 63.5  COGNITION: Overall cognitive status: Within functional limits for tasks assessed     SENSATION: WFL  POSTURE: fwd head   UPPER EXTREMITY ROM:   Shoulder ROM: WFL Neck ROM: WFL   UPPER EXTREMITY MMT:  MMT Right eval Left eval  Shoulder flexion 4 4  Shoulder extension     Shoulder abduction 4 4  Shoulder adduction    Shoulder internal rotation    Shoulder external rotation    Middle trapezius    Lower trapezius    Elbow flexion    Elbow extension    Wrist flexion    Wrist extension    Wrist ulnar deviation    Wrist radial deviation    Wrist pronation    Wrist supination    Grip strength (lbs)    (Blank rows = not tested)  SHOULDER SPECIAL TESTS:  JOINT MOBILITY TESTING:    PALPATION:  Tenderness in bil mid and upper c-spine paraspinals , much tenderness into sub occipitals.     TODAY'S TREATMENT:                                                                                                                                         DATE:   11/30/2023 Aerobic: Supine:  Seated: cervical rom, flexion x 5, rotation 2 x 5 bil;  Levator stretch 30 sec x 3 on L;   Quadruped: SA presses 2 x 10 with cueing for head position/chin tuck Standing:  shoulder extensions Blue TB 2 x12,  shoulder ER RTB 2 x 10; Wall push ups 2 x 10;  Stretches:  Prone:  Neuromuscular Re-education: Manual Therapy:  STM/TPR to bil cervical paraspinals, R scalenes and along clavicle, SOR, Cervical manual traction; manual stretching for UT s and levator bil;  Therapeutic Activity:  Self Care:  Previous:  Aerobic: Supine:  Seated: cervical rotation with overpressure 2 x 8 to R, x 8 to L,  Standing:  shoulder extensions Blue TB 2 x12,  Rows 2x12 BlueTB, reviewed other options/positions for scapular strengthening and lat pull downs,  Stretches:  Prone:  Neuromuscular Re-education: Manual Therapy:  STM/TPR to bil cervical paraspinals, SOR, Cervical manual traction; manual stretching for UT s and levator bil;  Therapeutic Activity: Self Care:     11/18/23  Aerobic: Supine:  Seated: chin tuck + rotation x10 B, chin tuck + lateral flexion x10 B, SCM stretch 1x30 seconds B, scalene stretch 1x30 seconds B, thoracic flexion/rotation stretch into thoracic extension x6  B Standing:  shoulder extensions green TB x12, scap retractions x12 green TB  Stretches:  Prone: blackburn 6 x10 each 0# Neuromuscular Re-education: Manual Therapy:   Therapeutic Activity: Self Care:   MHP x8 minutes cervical musculature and suboccipitals per pt request prior to exercise (not included in billing)  Previous:  Aerobic: Supine:  Seated: chin tucks 12x3 seconds,  chin tucks + rotation x10 B, chin tucks + lateral flexion x10 Standing: scap retractions red TB 12x3 seconds, shoulder extensions red TB 12x3 second holds  Stretches: SCM stretches 2x30 seconds B, scalene stretches 2x30 seconds B, UT stretch 1x30 seconds B, levator stretch 1x30 seconds  Neuromuscular Re-education: Manual Therapy: suboccipital release  Therapeutic Activity: Self Care:     PATIENT EDUCATION:  Education details: updated and reviewed HEP Person educated: Patient Education method: Explanation, Demonstration, Tactile cues, Verbal cues, and Handouts Education comprehension: verbalized understanding, returned demonstration, verbal cues required, tactile cues required, and needs further education   HOME EXERCISE PROGRAM:  Access Code: [email or web address] URL: [email or web address] Date: 11/18/2023 Prepared by: Josette Rough    ASSESSMENT:  CLINICAL IMPRESSION:  11/30/23 Pt progressing well. Decreasing pain overall and improved tolerance for standing and job duties as well. Pt with muscle tension in R sided musculature today, addressed with manual, and ther ex for mobility. Continued strengthening for postural muscles. Plan to continue as tolerated.   Eval:  Patient presents with primary complaint of pain in cervical region and into bil UEs. He has most pain and tenderness in sub occipital region today. He has tendency for fwd head posture, and will benefit from education on posture with work and functional activities. He will also benefit from strengthening for postural muscles for  improving ability for work duties. Pt with decreased ability for full functional activities, reaching, lifting, carrying, and IADLs. Pt to benefit from skilled PT to improve deficits and pain.    OBJECTIVE IMPAIRMENTS: decreased activity tolerance, decreased mobility, decreased strength, increased muscle spasms, impaired UE functional use, improper body mechanics, and pain.   ACTIVITY LIMITATIONS: carrying, lifting, bending, sleeping, reach over head, hygiene/grooming, and locomotion level  PARTICIPATION LIMITATIONS: meal prep, cleaning, shopping, community activity, occupation, and yard work  PERSONAL FACTORS: Time since onset of injury/illness/exacerbation are also affecting patient's functional outcome.   REHAB POTENTIAL: Good  CLINICAL DECISION MAKING: Stable/uncomplicated  EVALUATION COMPLEXITY: Low  GOALS: Goals reviewed with patient? Yes   SHORT TERM GOALS: Target date: 11/23/2023  Pt to be intendment with initial HEP  Goal status: MET  2.  Pt to report compliance with postural exercises during the work day.   Goal status: MET    LONG TERM GOALS: Target date: 12/28/2023   Pt to be independent with final HEP  Goal status: INITIAL  2.  Pt to demo improved  pain in neck and into  UE s to 0-2/10 with full work day.   Goal status: INITIAL  3.  Pt to demo improved strength of shoulders and postural muscles to be at least 4/5, to improve ability for work duties .   Goal status: INITIAL    PLAN: PT FREQUENCY: 1-2x/week  PT DURATION: 8 weeks  PLANNED INTERVENTIONS: Therapeutic exercises, Therapeutic activity, Neuromuscular re-education, Patient/Family education, Self Care, Joint mobilization, Joint manipulation, Stair training, DME instructions, Aquatic Therapy, Dry Needling, Electrical stimulation, Cryotherapy, Moist heat, Taping, Ultrasound, Ionotophoresis 4mg /ml Dexamethasone, Manual therapy,  Vasopneumatic device, Traction, Spinal manipulation, Spinal  mobilization,   PLAN FOR NEXT SESSION:   Tinnie Don, PT, DPT 6:32 PM  11/30/23

## 2023-12-01 ENCOUNTER — Other Ambulatory Visit (HOSPITAL_COMMUNITY): Payer: Self-pay

## 2023-12-01 DIAGNOSIS — F331 Major depressive disorder, recurrent, moderate: Secondary | ICD-10-CM | POA: Diagnosis not present

## 2023-12-01 DIAGNOSIS — F411 Generalized anxiety disorder: Secondary | ICD-10-CM | POA: Diagnosis not present

## 2023-12-01 DIAGNOSIS — F50811 Binge eating disorder, moderate: Secondary | ICD-10-CM | POA: Diagnosis not present

## 2023-12-01 MED ORDER — LISDEXAMFETAMINE DIMESYLATE 20 MG PO CAPS
20.0000 mg | ORAL_CAPSULE | Freq: Every morning | ORAL | 0 refills | Status: DC
  Filled 2023-12-01: qty 30, 30d supply, fill #0

## 2023-12-01 MED ORDER — VILAZODONE HCL 20 MG PO TABS
20.0000 mg | ORAL_TABLET | Freq: Every day | ORAL | 0 refills | Status: DC
  Filled 2023-12-01 – 2023-12-14 (×2): qty 30, 30d supply, fill #0

## 2023-12-02 ENCOUNTER — Encounter: Payer: Self-pay | Admitting: Physical Therapy

## 2023-12-02 ENCOUNTER — Other Ambulatory Visit (HOSPITAL_COMMUNITY): Payer: Self-pay

## 2023-12-02 ENCOUNTER — Ambulatory Visit (INDEPENDENT_AMBULATORY_CARE_PROVIDER_SITE_OTHER): Payer: 59 | Admitting: Physical Therapy

## 2023-12-02 DIAGNOSIS — M542 Cervicalgia: Secondary | ICD-10-CM | POA: Diagnosis not present

## 2023-12-02 NOTE — Therapy (Signed)
 OUTPATIENT PHYSICAL THERAPY UPPER EXTREMITY TREATMENT   Patient Name: Alex [name] MRN: [id number] DOB:01/11/1981, 43 y.o., adult Today's Date: 12/02/2023   END OF SESSION:  PT End of Session - 12/02/23 0803     Visit Number 6    Number of Visits 16    Date for PT Re-Evaluation 12/28/23    Authorization Type [facility]- [employer]- Save    PT Start Time 0804    PT Stop Time [phone]    PT Time Calculation (min) 39 min    Activity Tolerance Patient tolerated treatment well    Behavior During Therapy [facility] for tasks assessed/performed               Past Medical History:  Diagnosis Date   Anxiety    Arterial embolism of left leg (HCC)    Asthma    exercise-induced   Constipation    Depression    Infertility male    Palpitation    SOB (shortness of breath)    Vitamin D  deficiency    Past Surgical History:  Procedure Laterality Date   COLONOSCOPY  2017   bleeding - negative scope   WISDOM TOOTH EXTRACTION     Patient Active Problem List   Diagnosis Date Noted   Attention deficit hyperactivity disorder (ADHD), combined type 11/23/2023   Other constipation 03/16/2023   Obesity, Beginning BMI 44.62 01/05/2023   BMI 45.0-49.9, adult (HCC) 12/08/2022   Obesity, Beginning BMI 44.62 12/08/2022   Depression 07/02/2022   Other hyperlipidemia 03/17/2022   Vitamin D  deficiency 03/17/2022   Insulin  resistance 03/17/2022   Vitamin B12 deficiency 03/17/2022   Major depressive disorder, recurrent, moderate (HCC) 01/07/2022   Generalized anxiety disorder 01/07/2022   Diarrhea 12/22/2021   Hemorrhage of rectum and anus 12/22/2021   MDD (major depressive disorder), single episode, severe , no psychosis (HCC)    MDD (major depressive disorder), recurrent severe, without psychosis (HCC) 12/13/2017   Class 3 severe obesity with serious comorbidity and body mass index (BMI) of 40.0 to 44.9 in adult (HCC) 03/04/2016   White coat syndrome without diagnosis of hypertension 03/04/2016    Snoring 01/14/2016    PCP: Mardy Allwardt  REFERRING PROVIDER: Mardy Allwardt  REFERRING DIAG: Cervical Radiculopathy  THERAPY DIAG:  Cervicalgia  Rationale for Evaluation and Treatment: Rehabilitation  ONSET DATE: 6 mo ago  SUBJECTIVE:                                                                                                                                                                                      SUBJECTIVE STATEMENT:  12/02/23 Pt states doing quite a bit better. No pain today or last  couple days. Less stiff this am.    EVAL: Pt has pain that started Several months ago, has been more consistent in the last couple months.  He was having significant pain, tingling into hands, and some headaches. He works as Scientist, [employer], 12 hour shifts.  States pain and tingling has improved, now feeling it maybe 2-3 times/wk. Was  happening mostly in the morning, but also feeling during the week.  Pt also recently had pneumonia, has some lingering symptoms, but doing better.  NO steroid, taking meloxicam  as needed.  Hand dominance: Ambidextrous  PERTINENT HISTORY: none   PAIN:  Are you having pain? Yes: NPRS scale: 1/10 Pain location: suboccipitals L>R Pain description: sore and stiff Aggravating factors: morning stiffness Relieving factors: heat, improves by itself throughout the day  PRECAUTIONS: None  RED FLAGS: None   WEIGHT BEARING RESTRICTIONS: No  FALLS:  Has patient fallen in last 6 months? No   PLOF: Independent  PATIENT GOALS: Decreased pain  NEXT MD VISIT:   OBJECTIVE:   DIAGNOSTIC FINDINGS:    PATIENT SURVEYS :  FOTO : 63.5  COGNITION: Overall cognitive status: Within functional limits for tasks assessed     SENSATION: WFL  POSTURE: fwd head   UPPER EXTREMITY ROM:   Shoulder ROM: WFL Neck ROM: WFL   UPPER EXTREMITY MMT:  MMT Right eval Left eval  Shoulder flexion 4 4  Shoulder extension    Shoulder abduction 4 4   Shoulder adduction    Shoulder internal rotation    Shoulder external rotation    Middle trapezius    Lower trapezius    Elbow flexion    Elbow extension    Wrist flexion    Wrist extension    Wrist ulnar deviation    Wrist radial deviation    Wrist pronation    Wrist supination    Grip strength (lbs)    (Blank rows = not tested)  SHOULDER SPECIAL TESTS:  JOINT MOBILITY TESTING:    PALPATION:  Tenderness in bil mid and upper c-spine paraspinals , much tenderness into sub occipitals.     TODAY'S TREATMENT:                                                                                                                                         DATE:   12/02/2023 Aerobic: Supine:   High plank 15 sec x 4;  knees x 2;  education and cuing for optimal form  Seated:  Quadruped: SA presses  x 10 with cueing for head position/chin tuck Standing:  Rows Blue TB x 20;  shoulder extensions Blue TB 2 x12,  counter push ups 2 x 10;  Pull aparts/rev fly GTB 2 x 10;  Pec stretch at wall with cervcial SB x 3 bil;  Postural education standing at wall x 2 min, head position, chin tucks and shoulder position.  Stretches:  Prone:  Neuromuscular Re-education: Manual Therapy:  STM/TPR to bil cervical paraspinals,  SOR, Cervical manual traction; manual stretching for UT s and levator bil;  Therapeutic Activity: Self Care:   Previous:  Aerobic: Supine:  Seated: cervical rotation with overpressure 2 x 8 to R, x 8 to L,  Standing:  shoulder extensions Blue TB 2 x12,  Rows 2x12 BlueTB, reviewed other options/positions for scapular strengthening and lat pull downs,  Stretches:  Prone:  Neuromuscular Re-education: Manual Therapy:  STM/TPR to bil cervical paraspinals, SOR, Cervical manual traction; manual stretching for UT s and levator bil;  Therapeutic Activity: Self Care:     11/18/23  Aerobic: Supine:  Seated: chin tuck + rotation x10 B, chin tuck + lateral flexion x10 B, SCM stretch  1x30 seconds B, scalene stretch 1x30 seconds B, thoracic flexion/rotation stretch into thoracic extension x6 B Standing:  shoulder extensions green TB x12, scap retractions x12 green TB  Stretches:  Prone: blackburn 6 x10 each 0# Neuromuscular Re-education: Manual Therapy:   Therapeutic Activity: Self Care    PATIENT EDUCATION:  Education details: updated and reviewed HEP Person educated: Patient Education method: Explanation, Demonstration, Tactile cues, Verbal cues, and Handouts Education comprehension: verbalized understanding, returned demonstration, verbal cues required, tactile cues required, and needs further education   HOME EXERCISE PROGRAM:  Access Code: [email or web address] URL: [email or web address] Date: 11/18/2023 Prepared by: Josette Rough    ASSESSMENT:  CLINICAL IMPRESSION:  12/02/23 Pt progressing well. Decreasing pain overall and improved tolerance for standing and job duties as well. Pt with little muscle tension or soreness today compared to previous weeks. Able to progress ther ex for postural strengthening. Challenged with this today more in weight bearing positions, but no pain. Pt to benefit form continued care.   Eval:  Patient presents with primary complaint of pain in cervical region and into bil UEs. He has most pain and tenderness in sub occipital region today. He has tendency for fwd head posture, and will benefit from education on posture with work and functional activities. He will also benefit from strengthening for postural muscles for improving ability for work duties. Pt with decreased ability for full functional activities, reaching, lifting, carrying, and IADLs. Pt to benefit from skilled PT to improve deficits and pain.    OBJECTIVE IMPAIRMENTS: decreased activity tolerance, decreased mobility, decreased strength, increased muscle spasms, impaired UE functional use, improper body mechanics, and pain.   ACTIVITY LIMITATIONS: carrying,  lifting, bending, sleeping, reach over head, hygiene/grooming, and locomotion level  PARTICIPATION LIMITATIONS: meal prep, cleaning, shopping, community activity, occupation, and yard work  PERSONAL FACTORS: Time since onset of injury/illness/exacerbation are also affecting patient's functional outcome.   REHAB POTENTIAL: Good  CLINICAL DECISION MAKING: Stable/uncomplicated  EVALUATION COMPLEXITY: Low  GOALS: Goals reviewed with patient? Yes   SHORT TERM GOALS: Target date: 11/23/2023  Pt to be intendment with initial HEP  Goal status: MET  2.  Pt to report compliance with postural exercises during the work day.   Goal status: MET    LONG TERM GOALS: Target date: 12/28/2023   Pt to be independent with final HEP  Goal status: INITIAL  2.  Pt to demo improved  pain in neck and into UE s to 0-2/10 with full work day.   Goal status: INITIAL  3.  Pt to demo improved strength of shoulders and postural muscles to be at least 4/5, to improve ability for work duties .   Goal status: INITIAL    PLAN: PT FREQUENCY: 1-2x/week  PT DURATION:  8 weeks  PLANNED INTERVENTIONS: Therapeutic exercises, Therapeutic activity, Neuromuscular re-education, Patient/Family education, Self Care, Joint mobilization, Joint manipulation, Stair training, DME instructions, Aquatic Therapy, Dry Needling, Electrical stimulation, Cryotherapy, Moist heat, Taping, Ultrasound, Ionotophoresis 4mg /ml Dexamethasone, Manual therapy,  Vasopneumatic device, Traction, Spinal manipulation, Spinal mobilization,   PLAN FOR NEXT SESSION:  f/u foto   Tinnie Don, PT, DPT 8:48 AM  12/02/23

## 2023-12-07 ENCOUNTER — Encounter: Payer: 59 | Admitting: Physical Therapy

## 2023-12-09 ENCOUNTER — Encounter: Payer: Self-pay | Admitting: Physical Therapy

## 2023-12-09 ENCOUNTER — Ambulatory Visit (INDEPENDENT_AMBULATORY_CARE_PROVIDER_SITE_OTHER): Payer: 59 | Admitting: Physical Therapy

## 2023-12-09 DIAGNOSIS — M542 Cervicalgia: Secondary | ICD-10-CM | POA: Diagnosis not present

## 2023-12-09 NOTE — Therapy (Signed)
OUTPATIENT PHYSICAL THERAPY UPPER EXTREMITY TREATMENT   Patient Name: Alex [name] MRN: [id number] DOB:1980-12-05, 43 y.o., adult Today's Date: 12/09/2023   END OF SESSION:  PT End of Session - 12/09/23 0938     Visit Number 7    Number of Visits 16    Date for PT Re-Evaluation 12/28/23    Authorization Type [facility]- [employer]- Save    PT Start Time [phone]    PT Stop Time 1013    PT Time Calculation (min) 38 min    Activity Tolerance Patient tolerated treatment well    Behavior During Therapy [facility] for tasks assessed/performed                Past Medical History:  Diagnosis Date   Anxiety    Arterial embolism of left leg (HCC)    Asthma    exercise-induced   Constipation    Depression    Infertility male    Palpitation    SOB (shortness of breath)    Vitamin D deficiency    Past Surgical History:  Procedure Laterality Date   COLONOSCOPY  2017   bleeding - negative scope   WISDOM TOOTH EXTRACTION     Patient Active Problem List   Diagnosis Date Noted   Attention deficit hyperactivity disorder (ADHD), combined type 11/23/2023   Other constipation 03/16/2023   Obesity, Beginning BMI 44.62 01/05/2023   BMI 45.0-49.9, adult (HCC) 12/08/2022   Obesity, Beginning BMI 44.62 12/08/2022   Depression 07/02/2022   Other hyperlipidemia 03/17/2022   Vitamin D deficiency 03/17/2022   Insulin resistance 03/17/2022   Vitamin B12 deficiency 03/17/2022   Major depressive disorder, recurrent, moderate (HCC) 01/07/2022   Generalized anxiety disorder 01/07/2022   Diarrhea 12/22/2021   Hemorrhage of rectum and anus 12/22/2021   MDD (major depressive disorder), single episode, severe , no psychosis (HCC)    MDD (major depressive disorder), recurrent severe, without psychosis (HCC) 12/13/2017   Class 3 severe obesity with serious comorbidity and body mass index (BMI) of 40.0 to 44.9 in adult (HCC) 03/04/2016   White coat syndrome without diagnosis of hypertension 03/04/2016    Snoring 01/14/2016    PCP: Arlyss Repress Allwardt  REFERRING PROVIDER: Arlyss Repress Allwardt  REFERRING DIAG: Cervical Radiculopathy  THERAPY DIAG:  Cervicalgia  Rationale for Evaluation and Treatment: Rehabilitation  ONSET DATE: 6 mo ago  SUBJECTIVE:                                                                                                                                                                                      SUBJECTIVE STATEMENT:  12/09/23 Pt still doing very well. Had slight tension headache on Tuesday,  but got it to go away with some stretches.     EVAL: Pt has pain that started Several months ago, has been more consistent in the last couple months.  He was having significant pain, tingling into hands, and some headaches. He works as Scientist, [employer], 12 hour shifts.  States pain and tingling has improved, now feeling it maybe 2-3 times/wk. Was  happening mostly in the morning, but also feeling during the week.  Pt also recently had pneumonia, has some lingering symptoms, but doing better.  NO steroid, taking meloxicam as needed.  Hand dominance: Ambidextrous  PERTINENT HISTORY: none   PAIN:  Are you having pain? Yes: NPRS scale: 1/10 Pain location: suboccipitals L>R Pain description: sore and stiff Aggravating factors: morning stiffness Relieving factors: heat, improves by itself throughout the day  PRECAUTIONS: None  RED FLAGS: None   WEIGHT BEARING RESTRICTIONS: No  FALLS:  Has patient fallen in last 6 months? No   PLOF: Independent  PATIENT GOALS: Decreased pain  NEXT MD VISIT:   OBJECTIVE:   DIAGNOSTIC FINDINGS:    PATIENT SURVEYS :  FOTO : 63.5  COGNITION: Overall cognitive status: Within functional limits for tasks assessed     SENSATION: WFL  POSTURE: fwd head   UPPER EXTREMITY ROM:   Shoulder ROM: WFL Neck ROM: WFL   UPPER EXTREMITY MMT:  MMT Right eval Left eval  Shoulder flexion 4 4  Shoulder extension    Shoulder  abduction 4 4  Shoulder adduction    Shoulder internal rotation    Shoulder external rotation    Middle trapezius    Lower trapezius    Elbow flexion    Elbow extension    Wrist flexion    Wrist extension    Wrist ulnar deviation    Wrist radial deviation    Wrist pronation    Wrist supination    Grip strength (lbs)    (Blank rows = not tested)  SHOULDER SPECIAL TESTS:  JOINT MOBILITY TESTING:    PALPATION:  Tenderness in bil mid and upper c-spine paraspinals , much tenderness into sub occipitals.     TODAY'S TREATMENT:                                                                                                                                         DATE:   12/09/2023 Aerobic: Supine:   Seated:  Quadruped:  Standing:   Stretches:  Prone:  Neuromuscular Re-education: High plank 15 sec x 2;  knees x 3;  education and cuing for optimal form ; SA presses  x 5 with cueing for head position/chin tuck shoulder extensions Blue TB 2 x12,  Pull aparts/rev fly GTB 2 x 10;  Postural education standing at wall x 2 min, head position, chin tucks and shoulder position.  UE flexion x 10;  Manual Therapy:  STM/TPR to bil cervical paraspinals,  SOR,  Cervical manual traction; manual stretching for UT s and levator bil;  Therapeutic Activity: Self Care:   Previous:  Aerobic: Supine:  Seated: cervical rotation with overpressure 2 x 8 to R, x 8 to L,  Standing:  shoulder extensions Blue TB 2 x12,  Rows 2x12 BlueTB, reviewed other options/positions for scapular strengthening and lat pull downs,  Stretches:  Prone:  Neuromuscular Re-education: Manual Therapy:  STM/TPR to bil cervical paraspinals, SOR, Cervical manual traction; manual stretching for UT s and levator bil;  Therapeutic Activity: Self Care:    PATIENT EDUCATION:  Education details: updated and reviewed HEP Person educated: Patient Education method: Explanation, Demonstration, Tactile cues, Verbal cues, and  Handouts Education comprehension: verbalized understanding, returned demonstration, verbal cues required, tactile cues required, and needs further education   HOME EXERCISE PROGRAM:  Access Code: [email or web address] URL: [email or web address] Date: 11/18/2023 Prepared by: Nedra Hai    ASSESSMENT:  CLINICAL IMPRESSION:  12/09/23 Pt progressing well. Pt having less pain overall, and doing well with managing slight pain when it arises. He is doing well with strengthening. Continued cueing for body position and head position with strengthening today. Challenged with full plank position. He will f/u with MD next week and return for 1 more visit, possible d/c if he is still doing well. Discussed continuing stress relief and massage/heat as needed.  Eval:  Patient presents with primary complaint of pain in cervical region and into bil UEs. He has most pain and tenderness in sub occipital region today. He has tendency for fwd head posture, and will benefit from education on posture with work and functional activities. He will also benefit from strengthening for postural muscles for improving ability for work duties. Pt with decreased ability for full functional activities, reaching, lifting, carrying, and IADLs. Pt to benefit from skilled PT to improve deficits and pain.    OBJECTIVE IMPAIRMENTS: decreased activity tolerance, decreased mobility, decreased strength, increased muscle spasms, impaired UE functional use, improper body mechanics, and pain.   ACTIVITY LIMITATIONS: carrying, lifting, bending, sleeping, reach over head, hygiene/grooming, and locomotion level  PARTICIPATION LIMITATIONS: meal prep, cleaning, shopping, community activity, occupation, and yard work  PERSONAL FACTORS: Time since onset of injury/illness/exacerbation are also affecting patient's functional outcome.   REHAB POTENTIAL: Good  CLINICAL DECISION MAKING: Stable/uncomplicated  EVALUATION COMPLEXITY:  Low  GOALS: Goals reviewed with patient? Yes   SHORT TERM GOALS: Target date: 11/23/2023  Pt to be intendment with initial HEP  Goal status: MET  2.  Pt to report compliance with postural exercises during the work day.   Goal status: MET    LONG TERM GOALS: Target date: 12/28/2023   Pt to be independent with final HEP  Goal status: INITIAL  2.  Pt to demo improved  pain in neck and into UE s to 0-2/10 with full work day.   Goal status: INITIAL  3.  Pt to demo improved strength of shoulders and postural muscles to be at least 4/5, to improve ability for work duties .   Goal status: INITIAL    PLAN: PT FREQUENCY: 1-2x/week  PT DURATION: 8 weeks  PLANNED INTERVENTIONS: Therapeutic exercises, Therapeutic activity, Neuromuscular re-education, Patient/Family education, Self Care, Joint mobilization, Joint manipulation, Stair training, DME instructions, Aquatic Therapy, Dry Needling, Electrical stimulation, Cryotherapy, Moist heat, Taping, Ultrasound, Ionotophoresis 4mg /ml Dexamethasone, Manual therapy,  Vasopneumatic device, Traction, Spinal manipulation, Spinal mobilization,   PLAN FOR NEXT SESSION:  f /u foto   Sedalia Muta, PT, DPT 10:13 AM  12/09/23

## 2023-12-13 NOTE — Progress Notes (Unsigned)
[name] [address] [place] [address] Phone: [phone]   Assessment and Plan:     There are no diagnoses linked to this encounter.  ***   Pertinent previous records reviewed include ***    Follow Up: ***     Subjective:   I, Alex [name], am serving as a Neurosurgeon for Doctor Richardean Sale   Chief Complaint: neck pain    HPI:    10/05/2023 Patient is a 43 year old with neck pain. Patient states that pain has increased over the past couple of months. Works in [employer]. Decreased ROM. Does endorse numbness down to the fingers. Pain does radiate up to his head and down to his shoulders. Was getting stabbing pain behind the eyes. Does endorse intermittent vertigo. 3 weeks ago got a massage and that has helped with symptoms. No meds for the pain. Meloxicam for another aliment and that helped with the pain.    11/02/2023 Patient states been sick so has been coughing a lot so flared a little. But was improving   12/14/2023 Patient states  Relevant Historical Information: None pertinent  Additional pertinent review of systems negative.   Current Outpatient Medications:    albuterol (VENTOLIN HFA) 108 (90 Base) MCG/ACT inhaler, Inhale 1-2 puffs into the lungs every 6 (six) hours as needed for wheezing or shortness of breath., Disp: 6.7 g, Rfl: 11   benzonatate (TESSALON) 100 MG capsule, Take 1 capsule (100 mg total) by mouth 3 (three) times daily as needed for cough., Disp: 30 capsule, Rfl: 0   Fexofenadine HCl (ALLEGRA PO), Take by mouth., Disp: , Rfl:    fluticasone (FLOVENT HFA) 110 MCG/ACT inhaler, Inhale 1 puff into the lungs in the morning and at bedtime., Disp: 12 g, Rfl: 11   hydrocortisone-pramoxine (ANALPRAM-HC) 2.5-1 % rectal cream, Place 1 Application rectally 3 (three) times daily for 7 days, Disp: 30 g, Rfl: 3   hydrOXYzine (ATARAX) 25 MG tablet, Take 1 tablet (25 mg total) daily as needed for  anxiety. Take 2 tablets (50 mg total) by mouth at bedtime as needed for sleep, Disp: 30 tablet, Rfl: 2   lisdexamfetamine (VYVANSE) 20 MG capsule, Take 1 capsule (20 mg total) by mouth every morning., Disp: 30 capsule, Rfl: 0   meloxicam (MOBIC) 15 MG tablet, Take 1 tablet (15 mg total) by mouth daily., Disp: 30 tablet, Rfl: 0   Multiple Vitamin (MULTIVITAMIN) tablet, Take 1 tablet by mouth daily., Disp: , Rfl:    mupirocin ointment (BACTROBAN) 2 %, Apply 1 Application topically 2 (two) times daily., Disp: 22 g, Rfl: 11   naltrexone (DEPADE) 50 MG tablet, Take 1 tablet (50 mg total) by mouth daily., Disp: 30 tablet, Rfl: 0   ondansetron (ZOFRAN-ODT) 4 MG disintegrating tablet, Take 1 tablet (4 mg total) by mouth every 8 (eight) hours as needed for nausea or vomiting., Disp: 20 tablet, Rfl: 0   promethazine-dextromethorphan (PROMETHAZINE-DM) 6.25-15 MG/5ML syrup, Take 5 mLs by mouth 4 (four) times daily as needed for cough., Disp: 240 mL, Rfl: 0   SUMAtriptan (IMITREX) 50 MG tablet, Take 1 tablet (50 mg total) by mouth daily as needed for migraine. May repeat in 1-2 hours if headache persists or recurs., Disp: 10 tablet, Rfl: 3   venlafaxine XR (EFFEXOR-XR) 150 MG 24 hr capsule, Take 1 capsule (150 mg total) by mouth daily. (Patient not taking: Reported on 11/23/2023), Disp: 60 capsule, Rfl: 0   venlafaxine XR (EFFEXOR-XR)  75 MG 24 hr capsule, Take 1 capsule (75 mg total) by mouth daily. (Patient not taking: Reported on 11/23/2023), Disp: 7 capsule, Rfl: 0   Vilazodone HCl 20 MG TABS, Take 1 tablet (20 mg total) by mouth daily., Disp: 30 tablet, Rfl: 0   Vitamin D, Ergocalciferol, (DRISDOL) 1.25 MG (50000 UNIT) CAPS capsule, Take 1 capsule (50,000 Units total) by mouth every 7 (seven) days., Disp: 4 capsule, Rfl: 0   Objective:     There were no vitals filed for this visit.    There is no height or weight on file to calculate BMI.    Physical Exam:    ***   Electronically signed by:  [name] 7:36 AM 12/13/23

## 2023-12-14 ENCOUNTER — Ambulatory Visit (INDEPENDENT_AMBULATORY_CARE_PROVIDER_SITE_OTHER): Payer: 59 | Admitting: Sports Medicine

## 2023-12-14 ENCOUNTER — Other Ambulatory Visit: Payer: Self-pay

## 2023-12-14 ENCOUNTER — Other Ambulatory Visit (HOSPITAL_COMMUNITY): Payer: Self-pay

## 2023-12-14 VITALS — HR 73 | Ht 71.0 in | Wt 350.0 lb

## 2023-12-14 DIAGNOSIS — M542 Cervicalgia: Secondary | ICD-10-CM

## 2023-12-15 ENCOUNTER — Ambulatory Visit: Payer: 59 | Admitting: Internal Medicine

## 2023-12-16 ENCOUNTER — Encounter: Payer: Self-pay | Admitting: Physical Therapy

## 2023-12-16 ENCOUNTER — Ambulatory Visit (INDEPENDENT_AMBULATORY_CARE_PROVIDER_SITE_OTHER): Payer: 59 | Admitting: Physical Therapy

## 2023-12-16 DIAGNOSIS — M542 Cervicalgia: Secondary | ICD-10-CM

## 2023-12-16 NOTE — Therapy (Addendum)
 OUTPATIENT PHYSICAL THERAPY UPPER EXTREMITY TREATMENT   Patient Name: Alex [name] MRN: [id number] DOB:1981/05/19, 43 y.o., adult Today's Date: 12/16/2023   END OF SESSION:  PT End of Session - 12/16/23 1605     Visit Number 8    Number of Visits 16    Date for PT Re-Evaluation 12/28/23    Authorization Type [facility]- [employer]- Save    PT Start Time [phone]    PT Stop Time 1645    PT Time Calculation (min) 38 min    Activity Tolerance Patient tolerated treatment well    Behavior During Therapy [facility] for tasks assessed/performed                Past Medical History:  Diagnosis Date   Anxiety    Arterial embolism of left leg (HCC)    Asthma    exercise-induced   Constipation    Depression    Infertility male    Palpitation    SOB (shortness of breath)    Vitamin D  deficiency    Past Surgical History:  Procedure Laterality Date   COLONOSCOPY  2017   bleeding - negative scope   WISDOM TOOTH EXTRACTION     Patient Active Problem List   Diagnosis Date Noted   Attention deficit hyperactivity disorder (ADHD), combined type 11/23/2023   Other constipation 03/16/2023   Obesity, Beginning BMI 44.62 01/05/2023   BMI 45.0-49.9, adult (HCC) 12/08/2022   Obesity, Beginning BMI 44.62 12/08/2022   Depression 07/02/2022   Other hyperlipidemia 03/17/2022   Vitamin D  deficiency 03/17/2022   Insulin  resistance 03/17/2022   Vitamin B12 deficiency 03/17/2022   Major depressive disorder, recurrent, moderate (HCC) 01/07/2022   Generalized anxiety disorder 01/07/2022   Diarrhea 12/22/2021   Hemorrhage of rectum and anus 12/22/2021   MDD (major depressive disorder), single episode, severe , no psychosis (HCC)    MDD (major depressive disorder), recurrent severe, without psychosis (HCC) 12/13/2017   Class 3 severe obesity with serious comorbidity and body mass index (BMI) of 40.0 to 44.9 in adult (HCC) 03/04/2016   White coat syndrome without diagnosis of hypertension 03/04/2016    Snoring 01/14/2016    PCP: Mardy Allwardt  REFERRING PROVIDER: Mardy Allwardt  REFERRING DIAG: Cervical Radiculopathy  THERAPY DIAG:  Cervicalgia  Rationale for Evaluation and Treatment: Rehabilitation  ONSET DATE: 6 mo ago  SUBJECTIVE:                                                                                                                                                                                      SUBJECTIVE STATEMENT:  12/16/23 Pt still doing well. Notes some soreness/tension in mornings after /if  he sleeps hard. Has been able to get tension to go away with stretching.    EVAL: Pt has pain that started Several months ago, has been more consistent in the last couple months.  He was having significant pain, tingling into hands, and some headaches. He works as Scientist, forensic, 12 hour shifts.  States pain and tingling has improved, now feeling it maybe 2-3 times/wk. Was  happening mostly in the morning, but also feeling during the week.  Pt also recently had pneumonia, has some lingering symptoms, but doing better.  NO steroid, taking meloxicam  as needed.  Hand dominance: Ambidextrous  PERTINENT HISTORY: none   PAIN:  Are you having pain? Yes: NPRS scale: 0-1/10 Pain location: suboccipitals L>R Pain description: sore and stiff Aggravating factors: morning stiffness Relieving factors: heat, improves by itself throughout the day  PRECAUTIONS: None  RED FLAGS: None   WEIGHT BEARING RESTRICTIONS: No  FALLS:  Has patient fallen in last 6 months? No   PLOF: Independent  PATIENT GOALS: Decreased pain  NEXT MD VISIT:   OBJECTIVE:   DIAGNOSTIC FINDINGS:    PATIENT SURVEYS :  FOTO : 63.5 Visit 8 :  70.6  COGNITION: Overall cognitive status: Within functional limits for tasks assessed     SENSATION: WFL  POSTURE: fwd head   UPPER EXTREMITY ROM:   Shoulder ROM: WFL Neck ROM: WFL   UPPER EXTREMITY MMT:  MMT Right eval Left eval   Shoulder flexion 4 4  Shoulder extension    Shoulder abduction 4 4  Shoulder adduction    Shoulder internal rotation    Shoulder external rotation    Middle trapezius    Lower trapezius    Elbow flexion    Elbow extension    Wrist flexion    Wrist extension    Wrist ulnar deviation    Wrist radial deviation    Wrist pronation    Wrist supination    Grip strength (lbs)    (Blank rows = not tested)  SHOULDER SPECIAL TESTS:  JOINT MOBILITY TESTING:    PALPATION:      TODAY'S TREATMENT:                                                                                                                                         DATE:   12/16/2023 Aerobic: Supine:   Seated:   cervical flexion x 5, chin tuck with nod x 10;  Quadruped:  Standing:   Stretches:  Prone:  Neuromuscular Re-education: High plank on knees 30 sec x 3;  education and cuing for optimal form ; SA presses  x 15 with cueing for head position/chin tuck shoulder extensions Blue TB 2 x12 ,  regular row black TB x 20;  Pull aparts/rev fly GTB 2 x 10;  Manual Therapy:  STM/TPR to bil cervical paraspinals,  SOR, Cervical manual traction; manual stretching for UT s  and levator bil;  Therapeutic Activity: Self Care:   Previous:  Aerobic: Supine:  Seated: cervical rotation with overpressure 2 x 8 to R, x 8 to L,  Standing:  shoulder extensions Blue TB 2 x12,  Rows 2x12 BlueTB, reviewed other options/positions for scapular strengthening and lat pull downs,  Stretches:  Prone:  Neuromuscular Re-education: Manual Therapy:  STM/TPR to bil cervical paraspinals, SOR, Cervical manual traction; manual stretching for UT s and levator bil;  Therapeutic Activity: Self Care:    PATIENT EDUCATION:  Education details: updated and reviewed HEP Person educated: Patient Education method: Explanation, Demonstration, Tactile cues, Verbal cues, and Handouts Education comprehension: verbalized understanding, returned  demonstration, verbal cues required, tactile cues required, and needs further education   HOME EXERCISE PROGRAM:  Access Code: [email or web address] URL: [email or web address] Date: 11/18/2023 Prepared by: Josette Rough    ASSESSMENT:  CLINICAL IMPRESSION:  12/16/23 Pt progressing well. He has much improved pain overall, and is doing better managing mild symptoms should they arise. He has good ability for ther ex and improved postural awareness with standing activity and exercise. Pt has met goals at this time and is ready for d/c to HEP. Plan to hold 2 weeks and d/c if pt does not return with questions or increased pain. Pt in agreement with plan.   Eval:  Patient presents with primary complaint of pain in cervical region and into bil UEs. He has most pain and tenderness in sub occipital region today. He has tendency for fwd head posture, and will benefit from education on posture with work and functional activities. He will also benefit from strengthening for postural muscles for improving ability for work duties. Pt with decreased ability for full functional activities, reaching, lifting, carrying, and IADLs. Pt to benefit from skilled PT to improve deficits and pain.    OBJECTIVE IMPAIRMENTS: decreased activity tolerance, decreased mobility, decreased strength, increased muscle spasms, impaired UE functional use, improper body mechanics, and pain.   ACTIVITY LIMITATIONS: carrying, lifting, bending, sleeping, reach over head, hygiene/grooming, and locomotion level  PARTICIPATION LIMITATIONS: meal prep, cleaning, shopping, community activity, occupation, and yard work  PERSONAL FACTORS: Time since onset of injury/illness/exacerbation are also affecting patient's functional outcome.   REHAB POTENTIAL: Good  CLINICAL DECISION MAKING: Stable/uncomplicated  EVALUATION COMPLEXITY: Low  GOALS: Goals reviewed with patient? Yes   SHORT TERM GOALS: Target date: 11/23/2023  Pt to  be intendment with initial HEP  Goal status: MET  2.  Pt to report compliance with postural exercises during the work day.   Goal status: MET    LONG TERM GOALS: Target date: 12/28/2023   Pt to be independent with final HEP  Goal status:MET  2.  Pt to demo improved  pain in neck and into UE s to 0-2/10 with full work day.   Goal status: MET  3.  Pt to demo improved strength of shoulders and postural muscles to be at least 4/5, to improve ability for work duties .   Goal status: MET    PLAN: PT FREQUENCY: 1-2x/week  PT DURATION: 8 weeks  PLANNED INTERVENTIONS: Therapeutic exercises, Therapeutic activity, Neuromuscular re-education, Patient/Family education, Self Care, Joint mobilization, Joint manipulation, Stair training, DME instructions, Aquatic Therapy, Dry Needling, Electrical stimulation, Cryotherapy, Moist heat, Taping, Ultrasound, Ionotophoresis 4mg /ml Dexamethasone, Manual therapy,  Vasopneumatic device, Traction, Spinal manipulation, Spinal mobilization,   PLAN FOR NEXT SESSION:    Tinnie Don, PT, DPT 4:48 PM  12/16/23   PHYSICAL THERAPY DISCHARGE SUMMARY  Visits from Start of Care:  8   Plan: Patient agrees to discharge.  Patient goals were  met. Patient is being discharged due to meeting the stated rehab goals.     Tinnie Don, PT, DPT 2:15 PM  06/12/24

## 2023-12-21 ENCOUNTER — Ambulatory Visit (INDEPENDENT_AMBULATORY_CARE_PROVIDER_SITE_OTHER): Payer: 59 | Admitting: Family Medicine

## 2023-12-21 ENCOUNTER — Encounter (INDEPENDENT_AMBULATORY_CARE_PROVIDER_SITE_OTHER): Payer: Self-pay | Admitting: Family Medicine

## 2023-12-21 ENCOUNTER — Other Ambulatory Visit (HOSPITAL_COMMUNITY): Payer: Self-pay

## 2023-12-21 VITALS — BP 113/73 | HR 81 | Temp 98.0°F | Ht 70.0 in | Wt 347.0 lb

## 2023-12-21 DIAGNOSIS — E559 Vitamin D deficiency, unspecified: Secondary | ICD-10-CM | POA: Diagnosis not present

## 2023-12-21 DIAGNOSIS — E669 Obesity, unspecified: Secondary | ICD-10-CM

## 2023-12-21 DIAGNOSIS — F3289 Other specified depressive episodes: Secondary | ICD-10-CM

## 2023-12-21 DIAGNOSIS — E66813 Obesity, class 3: Secondary | ICD-10-CM

## 2023-12-21 DIAGNOSIS — Z6841 Body Mass Index (BMI) 40.0 and over, adult: Secondary | ICD-10-CM | POA: Diagnosis not present

## 2023-12-21 DIAGNOSIS — F5089 Other specified eating disorder: Secondary | ICD-10-CM | POA: Diagnosis not present

## 2023-12-21 MED ORDER — VITAMIN D (ERGOCALCIFEROL) 1.25 MG (50000 UNIT) PO CAPS
50000.0000 [IU] | ORAL_CAPSULE | ORAL | 0 refills | Status: DC
  Filled 2023-12-21: qty 4, 28d supply, fill #0

## 2023-12-21 MED ORDER — NALTREXONE HCL 50 MG PO TABS
50.0000 mg | ORAL_TABLET | Freq: Every day | ORAL | 0 refills | Status: DC
  Filled 2023-12-21: qty 30, 30d supply, fill #0

## 2023-12-21 NOTE — Progress Notes (Signed)
 .smr  Office: [phone]  /  Fax: [phone]  WEIGHT SUMMARY AND BIOMETRICS  Anthropometric Measurements Height: 5\' 10"  (1.778 m) Weight: (!) 347 lb (157.4 kg) BMI (Calculated): 49.79 Weight at Last Visit: 350 lb Weight Lost Since Last Visit: 3 lb Weight Gained Since Last Visit: 0 Starting Weight: 311 lb Total Weight Loss (lbs): 0 lb (0 kg) Peak Weight: 350 lb   Body Composition  Body Fat %: 42.6 % Fat Mass (lbs): 147.8 lbs Muscle Mass (lbs): 189.6 lbs Total Body Water (lbs): 142.4 lbs Visceral Fat Rating : 29   Other Clinical Data Fasting: no Labs: no Today's Visit #: 39 Starting Date: 06/25/21    Chief Complaint: OBESITY     History of Present Illness   Alex [name] "Alex [name]" is a 43 year old who presents for obesity treatment and progress evaluation.  They have lost three pounds in the last month. They are working on journaling with a 1500 calorie goal and 85 or more grams of protein goal, meeting this successfully about 60% of the time. They engage in 60 minutes of exercise two times per week, focusing on strength training with weights.  They have been taking naltrexone every night without side effects. Initially, they experienced nausea, but it was attributed to a concurrent illness. They are working on controlling emotional eating behaviors, noting that with Vyvanse, they feel more able to catch the impulse to eat emotionally, describing it as 'less automatic'.  Their sleep has been consistent, with efforts to go to bed on time, resulting in seven and a half hours of sleep. They find journaling their food intake helpful, especially when collaborating with their spouse on meal prep and planning. They have become more accepting of journaling even when their eating habits are not ideal.  They have been consistent with their strengthening exercises, using an app to track their workouts at least two days a week. They note feeling stronger and have observed  physical changes, such as increased muscle size.          PHYSICAL EXAM:  Blood pressure 113/73, pulse 81, temperature 98 F (36.7 C), height 5\' 10"  (1.778 m), weight (!) 347 lb (157.4 kg), SpO2 98%. Body mass index is 49.79 kg/m.  DIAGNOSTIC DATA REVIEWED:  BMET    Component Value Date/Time   NA 139 10/12/2023 0924   K 4.6 10/12/2023 0924   CL 104 10/12/2023 0924   CO2 20 10/12/2023 0924   GLUCOSE 85 10/12/2023 0924   GLUCOSE 94 06/01/2023 1347   BUN 14 10/12/2023 0924   CREATININE 0.82 10/12/2023 0924   CREATININE 0.73 06/01/2023 1347   CALCIUM 9.2 10/12/2023 0924   GFRNONAA >60 06/05/2021 1542   GFRAA 119 03/19/2020 0913   Lab Results  Component Value Date   HGBA1C 5.2 10/12/2023   HGBA1C 5.0 06/25/2021   Lab Results  Component Value Date   INSULIN 9.2 10/12/2023   INSULIN 6.5 06/25/2021   Lab Results  Component Value Date   TSH 2.00 06/01/2023   CBC    Component Value Date/Time   WBC 6.4 03/17/2022 0940   WBC 7.0 06/05/2021 1542   RBC 5.20 03/17/2022 0940   RBC 4.91 06/05/2021 1542   HGB 15.3 03/17/2022 0940   HCT 45.7 03/17/2022 0940   PLT 200 03/17/2022 0940   MCV 88 03/17/2022 0940   MCH 29.4 03/17/2022 0940   MCH 27.7 06/05/2021 1542   MCHC 33.5 03/17/2022 0940   MCHC 32.1 06/05/2021 1542  RDW 12.7 03/17/2022 0940   Iron Studies    Component Value Date/Time   IRON 61 11/26/2021 0832   TIBC 373 11/26/2021 0832   FERRITIN 29 (L) 11/26/2021 0832   IRONPCTSAT 16 11/26/2021 0832   Lipid Panel     Component Value Date/Time   CHOL 190 10/12/2023 0924   TRIG 47 10/12/2023 0924   HDL 58 10/12/2023 0924   CHOLHDL 3.7 06/01/2023 1347   LDLCALC 123 (H) 10/12/2023 0924   LDLCALC 121 (H) 06/01/2023 1347   Hepatic Function Panel     Component Value Date/Time   PROT 6.6 10/12/2023 0924   ALBUMIN 4.2 10/12/2023 0924   AST 28 10/12/2023 0924   ALT 33 10/12/2023 0924   ALKPHOS 93 10/12/2023 0924   BILITOT 0.4 10/12/2023 0924       Component Value Date/Time   TSH 2.00 06/01/2023 1347   Nutritional Lab Results  Component Value Date   VD25OH 36.9 10/12/2023   VD25OH 48 06/01/2023   VD25OH 44.3 09/15/2022     Assessment and Plan    Obesity Lost 3 lbs in the last month. Adhering to a 1500 calorie diet with a goal of 85 grams of protein, meeting this goal about 60% of the time. Engaging in strength training for 60 minutes twice a week. No issues with naltrexone, no side effects. Emotional eating behaviors managed with Vyvanse, improving impulse control. Discussed compounded Zepbound; explained risks of non-FDA approved substances, potential cardiotoxicity, and lack of oversight. - Continue 1500 calorie diet with 85 grams of protein goal - Continue strength training 60 minutes twice a week - Refill naltrexone - Follow up in four weeks  Emotional Eating Behaviors Improved control over emotional eating behaviors with Vyvanse, helping catch impulses more effectively. Journaling food intake, beneficial in managing eating habits. Managing stress by reducing consumption of political news and engaging in relaxing activities like listening to audiobooks. Working on improving sleep hygiene. - Continue journaling food intake - Continue stress management techniques - Continue improving sleep hygiene  Vitamin D Deficiency Last vitamin D level was 36.9 ng/mL, checked around Christmas. Target range is 50-60 ng/mL. - Refill vitamin D - Recheck vitamin D levels in 1-2 months  General Health Maintenance   Follow-up - Follow up in four weeks.         Alex [name] was informed of the importance of frequent follow up visits to maximize Alex [name]'s success with intensive lifestyle modifications for Alex [name]'s multiple health conditions.    Quillian Quince, MD

## 2023-12-23 DIAGNOSIS — F331 Major depressive disorder, recurrent, moderate: Secondary | ICD-10-CM | POA: Diagnosis not present

## 2023-12-23 DIAGNOSIS — F50811 Binge eating disorder, moderate: Secondary | ICD-10-CM | POA: Diagnosis not present

## 2023-12-23 DIAGNOSIS — F411 Generalized anxiety disorder: Secondary | ICD-10-CM | POA: Diagnosis not present

## 2023-12-29 ENCOUNTER — Other Ambulatory Visit (HOSPITAL_COMMUNITY): Payer: Self-pay

## 2023-12-29 DIAGNOSIS — F411 Generalized anxiety disorder: Secondary | ICD-10-CM | POA: Diagnosis not present

## 2023-12-29 DIAGNOSIS — F331 Major depressive disorder, recurrent, moderate: Secondary | ICD-10-CM | POA: Diagnosis not present

## 2023-12-29 DIAGNOSIS — F50811 Binge eating disorder, moderate: Secondary | ICD-10-CM | POA: Diagnosis not present

## 2023-12-29 MED ORDER — VILAZODONE HCL 20 MG PO TABS
20.0000 mg | ORAL_TABLET | Freq: Every day | ORAL | 0 refills | Status: DC
  Filled 2023-12-29 – 2024-01-15 (×2): qty 60, 60d supply, fill #0

## 2023-12-29 MED ORDER — LISDEXAMFETAMINE DIMESYLATE 20 MG PO CAPS
20.0000 mg | ORAL_CAPSULE | Freq: Every morning | ORAL | 0 refills | Status: DC
  Filled 2024-01-06: qty 30, 30d supply, fill #0

## 2023-12-29 MED ORDER — LISDEXAMFETAMINE DIMESYLATE 20 MG PO CAPS
20.0000 mg | ORAL_CAPSULE | Freq: Every morning | ORAL | 0 refills | Status: DC
  Filled 2024-02-03: qty 30, 30d supply, fill #0

## 2023-12-30 ENCOUNTER — Other Ambulatory Visit (HOSPITAL_COMMUNITY): Payer: Self-pay

## 2024-01-06 ENCOUNTER — Other Ambulatory Visit (HOSPITAL_COMMUNITY): Payer: Self-pay

## 2024-01-13 DIAGNOSIS — F331 Major depressive disorder, recurrent, moderate: Secondary | ICD-10-CM | POA: Diagnosis not present

## 2024-01-13 DIAGNOSIS — F411 Generalized anxiety disorder: Secondary | ICD-10-CM | POA: Diagnosis not present

## 2024-01-13 DIAGNOSIS — F50811 Binge eating disorder, moderate: Secondary | ICD-10-CM | POA: Diagnosis not present

## 2024-01-15 ENCOUNTER — Other Ambulatory Visit (HOSPITAL_COMMUNITY): Payer: Self-pay

## 2024-01-18 ENCOUNTER — Ambulatory Visit (INDEPENDENT_AMBULATORY_CARE_PROVIDER_SITE_OTHER): Payer: 59 | Admitting: Family Medicine

## 2024-01-18 ENCOUNTER — Encounter (INDEPENDENT_AMBULATORY_CARE_PROVIDER_SITE_OTHER): Payer: Self-pay | Admitting: Family Medicine

## 2024-01-18 ENCOUNTER — Other Ambulatory Visit (HOSPITAL_COMMUNITY): Payer: Self-pay

## 2024-01-18 VITALS — BP 133/76 | HR 78 | Temp 98.1°F | Ht 70.0 in | Wt 341.0 lb

## 2024-01-18 DIAGNOSIS — E559 Vitamin D deficiency, unspecified: Secondary | ICD-10-CM

## 2024-01-18 DIAGNOSIS — Z6841 Body Mass Index (BMI) 40.0 and over, adult: Secondary | ICD-10-CM

## 2024-01-18 DIAGNOSIS — F5089 Other specified eating disorder: Secondary | ICD-10-CM

## 2024-01-18 DIAGNOSIS — E669 Obesity, unspecified: Secondary | ICD-10-CM | POA: Diagnosis not present

## 2024-01-18 DIAGNOSIS — F3289 Other specified depressive episodes: Secondary | ICD-10-CM

## 2024-01-18 MED ORDER — VITAMIN D (ERGOCALCIFEROL) 1.25 MG (50000 UNIT) PO CAPS
50000.0000 [IU] | ORAL_CAPSULE | ORAL | 0 refills | Status: DC
  Filled 2024-01-18: qty 4, 28d supply, fill #0

## 2024-01-18 MED ORDER — NALTREXONE HCL 50 MG PO TABS
50.0000 mg | ORAL_TABLET | Freq: Every day | ORAL | 0 refills | Status: DC
  Filled 2024-01-18: qty 30, 30d supply, fill #0

## 2024-01-18 NOTE — Progress Notes (Signed)
 Office: [phone]  /  Fax: [phone]  WEIGHT SUMMARY AND BIOMETRICS  Anthropometric Measurements Height: 5\' 10"  (1.778 m) Weight: (!) 341 lb (154.7 kg) BMI (Calculated): 48.93 Weight at Last Visit: 6 lb Weight Lost Since Last Visit: 0 Weight Gained Since Last Visit: 0 Starting Weight: 311 lb Total Weight Loss (lbs): 0 lb (0 kg) Peak Weight: 350 lb   Body Composition  Body Fat %: 41.7 % Fat Mass (lbs): 142.4 lbs Muscle Mass (lbs): 189.4 lbs Total Body Water (lbs): 139.2 lbs Visceral Fat Rating : 28   Other Clinical Data Fasting: No Labs: No Today's Visit #: 40 Starting Date: 06/25/21    Chief Complaint: OBESITY   History of Present Illness Alex [name] "Alex [name]" is a 43 year old who presents for obesity treatment plan assessment and progress review.  They are maintaining a food journal with a goal of 1500 calories and 85 or more grams of protein daily, achieving this about 50% of the time. They have lost six pounds in the last month. They engage in strengthening exercises, specifically weightlifting for 60 minutes, two times per week, and incorporate yard work into their routine, alternating between workouts and yard work days.  They are taking naltrexone for emotional and binge eating behaviors and report no negative side effects. They describe a reduction in overwhelming hunger and cravings, noting that they no longer feel the need to graze throughout the day. They request a refill of naltrexone today. They are also on Vyvanse for attention deficit, prescribed by their mental health specialist.  They are on a prescription of vitamin D, 50,000 international units per week, for vitamin D deficiency. Their last vitamin D level was below the target range of 50 to 60. They request a refill of their vitamin D prescription today.  They report that their blood pressure is slightly higher than usual but within normal variation. No nausea, a common side effect of  naltrexone. They mention that their physical activity has increased, contributing to their weight loss and improved muscle mass.      PHYSICAL EXAM:  Blood pressure 133/76, pulse 78, temperature 98.1 F (36.7 C), height 5\' 10"  (1.778 m), weight (!) 341 lb (154.7 kg), SpO2 99%. Body mass index is 48.93 kg/m.  DIAGNOSTIC DATA REVIEWED:  BMET    Component Value Date/Time   NA 139 10/12/2023 0924   K 4.6 10/12/2023 0924   CL 104 10/12/2023 0924   CO2 20 10/12/2023 0924   GLUCOSE 85 10/12/2023 0924   GLUCOSE 94 06/01/2023 1347   BUN 14 10/12/2023 0924   CREATININE 0.82 10/12/2023 0924   CREATININE 0.73 06/01/2023 1347   CALCIUM 9.2 10/12/2023 0924   GFRNONAA >60 06/05/2021 1542   GFRAA 119 03/19/2020 0913   Lab Results  Component Value Date   HGBA1C 5.2 10/12/2023   HGBA1C 5.0 06/25/2021   Lab Results  Component Value Date   INSULIN 9.2 10/12/2023   INSULIN 6.5 06/25/2021   Lab Results  Component Value Date   TSH 2.00 06/01/2023   CBC    Component Value Date/Time   WBC 6.4 03/17/2022 0940   WBC 7.0 06/05/2021 1542   RBC 5.20 03/17/2022 0940   RBC 4.91 06/05/2021 1542   HGB 15.3 03/17/2022 0940   HCT 45.7 03/17/2022 0940   PLT 200 03/17/2022 0940   MCV 88 03/17/2022 0940   MCH 29.4 03/17/2022 0940   MCH 27.7 06/05/2021 1542   MCHC 33.5 03/17/2022 0940   MCHC 32.1  06/05/2021 1542   RDW 12.7 03/17/2022 0940   Iron Studies    Component Value Date/Time   IRON 61 11/26/2021 0832   TIBC 373 11/26/2021 0832   FERRITIN 29 (L) 11/26/2021 0832   IRONPCTSAT 16 11/26/2021 0832   Lipid Panel     Component Value Date/Time   CHOL 190 10/12/2023 0924   TRIG 47 10/12/2023 0924   HDL 58 10/12/2023 0924   CHOLHDL 3.7 06/01/2023 1347   LDLCALC 123 (H) 10/12/2023 0924   LDLCALC 121 (H) 06/01/2023 1347   Hepatic Function Panel     Component Value Date/Time   PROT 6.6 10/12/2023 0924   ALBUMIN 4.2 10/12/2023 0924   AST 28 10/12/2023 0924   ALT 33 10/12/2023 0924    ALKPHOS 93 10/12/2023 0924   BILITOT 0.4 10/12/2023 0924      Component Value Date/Time   TSH 2.00 06/01/2023 1347   Nutritional Lab Results  Component Value Date   VD25OH 36.9 10/12/2023   VD25OH 48 06/01/2023   VD25OH 44.3 09/15/2022     Assessment and Plan Assessment & Plan Obesity Actively engaged in weight management through dietary changes and increased physical activity. Achieved a six-pound weight loss in the last month. Maintains a food journal with a goal of 1500 calories and 85 grams of protein daily, achieving this 50% of the time. Participates in weightlifting twice weekly and additional activities such as yard work. Reports increased activity, improved muscle mass, and hydration. Recognizes the significant contribution of physical activity to weight loss. - Continue dietary plan with a goal of 1500 calories and 85 grams of protein daily - Maintain weightlifting exercises twice weekly - Encourage additional physical activities such as yard work - Monitor weight and muscle mass regularly  Emotional Eating and Binge Eating Behaviors On naltrexone for emotional and binge eating behaviors, with no negative side effects. Reports reduced cravings and binge eating episodes. Vyvanse for attention deficit aids in controlling physical hunger. Feels more in control of eating habits with less frequent grazing and snacking. Naltrexone aids in cravings and control, commonly used for binge eating and compulsive behaviors. Main side effect is nausea, not currently experienced. - Refill naltrexone prescription - Monitor eating behaviors and adjust naltrexone dosage if necessary  Vitamin D Deficiency On 50,000 IU of vitamin D weekly. Last vitamin D level was below the target range of 50-60 ng/mL. Requests a refill of vitamin D prescription. - Refill vitamin D prescription, 50,000 IU weekly - Monitor vitamin D levels to reach target range  General Health Maintenance Maintains an  active lifestyle and monitors dietary intake. Blood pressure is within normal limits. - Encourage continued physical activity and dietary monitoring - Schedule regular follow-up appointments to monitor progress     Alex [name] was informed of the importance of frequent follow up visits to maximize [name]'s success with intensive lifestyle modifications for [name]'s multiple health conditions.    Quillian Quince, MD

## 2024-01-20 DIAGNOSIS — F411 Generalized anxiety disorder: Secondary | ICD-10-CM | POA: Diagnosis not present

## 2024-01-20 DIAGNOSIS — F331 Major depressive disorder, recurrent, moderate: Secondary | ICD-10-CM | POA: Diagnosis not present

## 2024-01-20 DIAGNOSIS — F50811 Binge eating disorder, moderate: Secondary | ICD-10-CM | POA: Diagnosis not present

## 2024-02-03 ENCOUNTER — Other Ambulatory Visit: Payer: Self-pay

## 2024-02-03 ENCOUNTER — Other Ambulatory Visit (HOSPITAL_COMMUNITY): Payer: Self-pay

## 2024-02-09 DIAGNOSIS — F900 Attention-deficit hyperactivity disorder, predominantly inattentive type: Secondary | ICD-10-CM | POA: Diagnosis not present

## 2024-02-09 DIAGNOSIS — F411 Generalized anxiety disorder: Secondary | ICD-10-CM | POA: Diagnosis not present

## 2024-02-09 DIAGNOSIS — F331 Major depressive disorder, recurrent, moderate: Secondary | ICD-10-CM | POA: Diagnosis not present

## 2024-02-15 ENCOUNTER — Other Ambulatory Visit (HOSPITAL_COMMUNITY): Payer: Self-pay

## 2024-02-15 ENCOUNTER — Telehealth (INDEPENDENT_AMBULATORY_CARE_PROVIDER_SITE_OTHER): Payer: 59 | Admitting: Family Medicine

## 2024-02-15 ENCOUNTER — Encounter (INDEPENDENT_AMBULATORY_CARE_PROVIDER_SITE_OTHER): Payer: Self-pay | Admitting: Family Medicine

## 2024-02-15 DIAGNOSIS — Z6841 Body Mass Index (BMI) 40.0 and over, adult: Secondary | ICD-10-CM | POA: Diagnosis not present

## 2024-02-15 DIAGNOSIS — F3289 Other specified depressive episodes: Secondary | ICD-10-CM

## 2024-02-15 DIAGNOSIS — F5089 Other specified eating disorder: Secondary | ICD-10-CM | POA: Diagnosis not present

## 2024-02-15 DIAGNOSIS — E559 Vitamin D deficiency, unspecified: Secondary | ICD-10-CM | POA: Diagnosis not present

## 2024-02-15 DIAGNOSIS — E669 Obesity, unspecified: Secondary | ICD-10-CM

## 2024-02-15 MED ORDER — NALTREXONE HCL 50 MG PO TABS
50.0000 mg | ORAL_TABLET | Freq: Every day | ORAL | 0 refills | Status: DC
  Filled 2024-02-15 – 2024-02-25 (×2): qty 30, 30d supply, fill #0

## 2024-02-15 MED ORDER — VITAMIN D (ERGOCALCIFEROL) 1.25 MG (50000 UNIT) PO CAPS
50000.0000 [IU] | ORAL_CAPSULE | ORAL | 0 refills | Status: DC
  Filled 2024-02-15 – 2024-02-25 (×2): qty 4, 28d supply, fill #0

## 2024-02-16 NOTE — Progress Notes (Signed)
 Office: [phone]  /  Fax: [phone]  WEIGHT SUMMARY AND BIOMETRICS  Anthropometric Measurements Height: 5\' 10"  (1.778 m) Weight: (!) 341 lb (154.7 kg) BMI (Calculated): 48.93   No data recorded Other Clinical Data Fasting: No Labs: No Today's Visit #: 41 Starting Date: 06/25/21 Comments: Virtual Visit    Chief Complaint: OBESITY   Virtual Visit via Telephone Note  I connected with Texie File on 02/16/24 at  9:40 AM EDT by audiovisual telehealth and verified that I am speaking with the correct person using two identifiers.  Location: Patient: home Provider: home   I discussed the limitations, risks, security and privacy concerns of performing an evaluation and management service by AV telehealth and the availability of in person appointments. I also discussed with the patient that there may be a patient responsible charge related to this service. The patient expressed understanding and agreed to proceed.    History of Present Illness [name] "Alex [name]" is a 43 year old who presents for obesity treatment and progress assessment.  They are actively managing their obesity through journaling and meeting calorie and protein criteria about 60% of the time. Their wife is facilitating meal planning and obtaining proper nutrition. They exercise three to four times a week using an AI app for exercise routine planning. Recently, an upper respiratory tract infection lasting about five days limited their ability to exercise, but they are feeling better now. During the illness, their protein intake was lower than usual but not significantly deficient.  They have a history of vitamin D  deficiency, which previously caused fatigue and sluggishness. They have been on prescription vitamin D  and are nearing their goal with a recent level of 48. They request a refill of their vitamin D  prescription.  They have a history of emotional eating behaviors and are being treated  with naltrexone , which they tolerate well with minimal gastrointestinal issues. They were off naltrexone  for a few days while sick, and upon restarting, experienced mild nausea that has since resolved. They request a refill of their naltrexone  prescription.      PHYSICAL EXAM:  Height 5\' 10"  (1.778 m), weight (!) 341 lb (154.7 kg). Body mass index is 48.93 kg/m.  DIAGNOSTIC DATA REVIEWED:  BMET    Component Value Date/Time   NA 139 10/12/2023 0924   K 4.6 10/12/2023 0924   CL 104 10/12/2023 0924   CO2 20 10/12/2023 0924   GLUCOSE 85 10/12/2023 0924   GLUCOSE 94 06/01/2023 1347   BUN 14 10/12/2023 0924   CREATININE 0.82 10/12/2023 0924   CREATININE 0.73 06/01/2023 1347   CALCIUM 9.2 10/12/2023 0924   GFRNONAA >60 06/05/2021 1542   GFRAA 119 03/19/2020 0913   Lab Results  Component Value Date   HGBA1C 5.2 10/12/2023   HGBA1C 5.0 06/25/2021   Lab Results  Component Value Date   INSULIN  9.2 10/12/2023   INSULIN  6.5 06/25/2021   Lab Results  Component Value Date   TSH 2.00 06/01/2023   CBC    Component Value Date/Time   WBC 6.4 03/17/2022 0940   WBC 7.0 06/05/2021 1542   RBC 5.20 03/17/2022 0940   RBC 4.91 06/05/2021 1542   HGB 15.3 03/17/2022 0940   HCT 45.7 03/17/2022 0940   PLT 200 03/17/2022 0940   MCV 88 03/17/2022 0940   MCH 29.4 03/17/2022 0940   MCH 27.7 06/05/2021 1542   MCHC 33.5 03/17/2022 0940   MCHC 32.1 06/05/2021 1542   RDW 12.7 03/17/2022 0940  Iron Studies    Component Value Date/Time   IRON 61 11/26/2021 0832   TIBC 373 11/26/2021 0832   FERRITIN 29 (L) 11/26/2021 0832   IRONPCTSAT 16 11/26/2021 0832   Lipid Panel     Component Value Date/Time   CHOL 190 10/12/2023 0924   TRIG 47 10/12/2023 0924   HDL 58 10/12/2023 0924   CHOLHDL 3.7 06/01/2023 1347   LDLCALC 123 (H) 10/12/2023 0924   LDLCALC 121 (H) 06/01/2023 1347   Hepatic Function Panel     Component Value Date/Time   PROT 6.6 10/12/2023 0924   ALBUMIN 4.2 10/12/2023  0924   AST 28 10/12/2023 0924   ALT 33 10/12/2023 0924   ALKPHOS 93 10/12/2023 0924   BILITOT 0.4 10/12/2023 0924      Component Value Date/Time   TSH 2.00 06/01/2023 1347   Nutritional Lab Results  Component Value Date   VD25OH 36.9 10/12/2023   VD25OH 48 06/01/2023   VD25OH 44.3 09/15/2022     Assessment and Plan Assessment & Plan  Obesity Obesity management with focus on journaling and meeting calorie and protein criteria, successful 60% of the time. Exercise limited due to recent illness but previously 3-4 times weekly using an AI app. Supportive involvement from spouse in health efforts. - Continue journaling. - Discuss AI-based recipe finding to meet nutritional criteria. - Encourage meal planning and prepping. - Follow up in four weeks.  Emotional eating behaviors Emotional eating behaviors managed with naltrexone , well-tolerated with minimal GI issues. Mild nausea upon restarting post-illness, resolved. - Refill naltrexone .  Vitamin D  deficiency Vitamin D  deficiency managed with prescription vitamin D , recent level at 48, nearing goal. Previously caused fatigue and sluggishness. - Refill vitamin D . - Follow up with labs as needed.   Alex [name] was informed of the importance of frequent follow up visits to maximize [name]'s success with intensive lifestyle modifications for [name]'s multiple health conditions.    Jasmine Mesi, MD

## 2024-02-23 ENCOUNTER — Other Ambulatory Visit (HOSPITAL_COMMUNITY): Payer: Self-pay

## 2024-02-23 DIAGNOSIS — F411 Generalized anxiety disorder: Secondary | ICD-10-CM | POA: Diagnosis not present

## 2024-02-23 DIAGNOSIS — F331 Major depressive disorder, recurrent, moderate: Secondary | ICD-10-CM | POA: Diagnosis not present

## 2024-02-23 DIAGNOSIS — F50811 Binge eating disorder, moderate: Secondary | ICD-10-CM | POA: Diagnosis not present

## 2024-02-23 MED ORDER — LISDEXAMFETAMINE DIMESYLATE 20 MG PO CAPS
20.0000 mg | ORAL_CAPSULE | ORAL | 0 refills | Status: DC
Start: 2024-05-03 — End: 2024-06-01

## 2024-02-23 MED ORDER — LISDEXAMFETAMINE DIMESYLATE 20 MG PO CAPS
20.0000 mg | ORAL_CAPSULE | Freq: Every morning | ORAL | 0 refills | Status: DC
Start: 2024-04-03 — End: 2024-06-01
  Filled 2024-04-10: qty 30, 30d supply, fill #0

## 2024-02-23 MED ORDER — VILAZODONE HCL 20 MG PO TABS
20.0000 mg | ORAL_TABLET | Freq: Every day | ORAL | 0 refills | Status: DC
  Filled 2024-02-23 – 2024-03-23 (×2): qty 90, 90d supply, fill #0

## 2024-02-23 MED ORDER — LISDEXAMFETAMINE DIMESYLATE 20 MG PO CAPS
20.0000 mg | ORAL_CAPSULE | ORAL | 0 refills | Status: DC
Start: 2024-03-04 — End: 2024-06-01
  Filled 2024-03-11: qty 30, 30d supply, fill #0

## 2024-02-25 ENCOUNTER — Other Ambulatory Visit (HOSPITAL_COMMUNITY): Payer: Self-pay

## 2024-03-11 ENCOUNTER — Other Ambulatory Visit: Payer: Self-pay

## 2024-03-14 ENCOUNTER — Encounter (INDEPENDENT_AMBULATORY_CARE_PROVIDER_SITE_OTHER): Payer: Self-pay | Admitting: Family Medicine

## 2024-03-14 ENCOUNTER — Ambulatory Visit (INDEPENDENT_AMBULATORY_CARE_PROVIDER_SITE_OTHER): Admitting: Family Medicine

## 2024-03-14 ENCOUNTER — Other Ambulatory Visit (HOSPITAL_COMMUNITY): Payer: Self-pay

## 2024-03-14 VITALS — BP 112/74 | HR 98 | Temp 98.0°F | Ht 70.0 in | Wt 347.0 lb

## 2024-03-14 DIAGNOSIS — F5089 Other specified eating disorder: Secondary | ICD-10-CM

## 2024-03-14 DIAGNOSIS — E88819 Insulin resistance, unspecified: Secondary | ICD-10-CM | POA: Diagnosis not present

## 2024-03-14 DIAGNOSIS — E559 Vitamin D deficiency, unspecified: Secondary | ICD-10-CM | POA: Diagnosis not present

## 2024-03-14 DIAGNOSIS — Z6841 Body Mass Index (BMI) 40.0 and over, adult: Secondary | ICD-10-CM

## 2024-03-14 DIAGNOSIS — E669 Obesity, unspecified: Secondary | ICD-10-CM

## 2024-03-14 DIAGNOSIS — F3289 Other specified depressive episodes: Secondary | ICD-10-CM

## 2024-03-14 MED ORDER — NALTREXONE HCL 50 MG PO TABS
50.0000 mg | ORAL_TABLET | Freq: Every day | ORAL | 0 refills | Status: DC
  Filled 2024-03-14: qty 30, 30d supply, fill #0

## 2024-03-14 MED ORDER — VITAMIN D (ERGOCALCIFEROL) 1.25 MG (50000 UNIT) PO CAPS
50000.0000 [IU] | ORAL_CAPSULE | ORAL | 0 refills | Status: DC
  Filled 2024-03-14 – 2024-04-10 (×2): qty 4, 28d supply, fill #0

## 2024-03-14 NOTE — Progress Notes (Signed)
 Office: [phone]  /  Fax: [phone]  WEIGHT SUMMARY AND BIOMETRICS  Anthropometric Measurements Height: 5\' 10"  (1.778 m) Weight: (!) 347 lb (157.4 kg) BMI (Calculated): 49.79 Weight at Last Visit: 341 lb Weight Lost Since Last Visit: 0 Weight Gained Since Last Visit: 6 lb Starting Weight: 311 lb Total Weight Loss (lbs): 0 lb (0 kg) Peak Weight: 350 lb   Body Composition  Body Fat %: 42.1 % Fat Mass (lbs): 146 lbs Muscle Mass (lbs): 191.2 lbs Total Body Water (lbs): 141.6 lbs Visceral Fat Rating : 28   Other Clinical Data Fasting: no Labs: no Today's Visit #: 42 Starting Date: 06/25/21    Chief Complaint: OBESITY   Discussed the use of AI scribe software for clinical note transcription with the patient, who gave verbal consent to proceed.  History of Present Illness Alex [name] "Alex [name]" is a 43 year old with obesity who presents for a follow-up on their obesity treatment and progress.  They have been managing their obesity by maintaining a daily caloric intake of around 2000 calories, focusing on consuming at least 100 grams of protein, but have adhered to this regimen only about 25% of the time. Despite these efforts, they have gained six pounds over the past two months since their last visit. They engage in weight training and exercise 3 to 6 times per week for 45 to 60 minutes per session. Episodes of 'mindless eating' have occurred, particularly after periods of being busy and unintentionally restrictive with their diet, leading to increased eating.  Their stress levels have been generally stable, though they experienced a week of stress related to contemplating a job change, ultimately deciding to remain in their current position. Sleep has been less optimal recently, with their average sleep duration decreasing to 6 to 6.5 hours per night from a previous 7 to 7.5 hours. They attribute this to poor decisions about staying up late and are working on  improving their sleep habits.  They have been using naltrexone  and vitamin D  as part of their treatment regimen. They experience occasional morning nausea, which occurs either after eating or as a general morning sensation. They prefer to exercise in their home gym due to discomfort with public gyms and have a supportive social environment, sharing their home gym with friends who do not attend public gyms.      PHYSICAL EXAM:  Blood pressure 112/74, pulse 98, temperature 98 F (36.7 C), height 5\' 10"  (1.778 m), weight (!) 347 lb (157.4 kg), SpO2 97%. Body mass index is 49.79 kg/m.  DIAGNOSTIC DATA REVIEWED:  BMET    Component Value Date/Time   NA 139 10/12/2023 0924   K 4.6 10/12/2023 0924   CL 104 10/12/2023 0924   CO2 20 10/12/2023 0924   GLUCOSE 85 10/12/2023 0924   GLUCOSE 94 06/01/2023 1347   BUN 14 10/12/2023 0924   CREATININE 0.82 10/12/2023 0924   CREATININE 0.73 06/01/2023 1347   CALCIUM 9.2 10/12/2023 0924   GFRNONAA >60 06/05/2021 1542   GFRAA 119 03/19/2020 0913   Lab Results  Component Value Date   HGBA1C 5.2 10/12/2023   HGBA1C 5.0 06/25/2021   Lab Results  Component Value Date   INSULIN  9.2 10/12/2023   INSULIN  6.5 06/25/2021   Lab Results  Component Value Date   TSH 2.00 06/01/2023   CBC    Component Value Date/Time   WBC 6.4 03/17/2022 0940   WBC 7.0 06/05/2021 1542   RBC 5.20 03/17/2022 0940  RBC 4.91 06/05/2021 1542   HGB 15.3 03/17/2022 0940   HCT 45.7 03/17/2022 0940   PLT 200 03/17/2022 0940   MCV 88 03/17/2022 0940   MCH 29.4 03/17/2022 0940   MCH 27.7 06/05/2021 1542   MCHC 33.5 03/17/2022 0940   MCHC 32.1 06/05/2021 1542   RDW 12.7 03/17/2022 0940   Iron Studies    Component Value Date/Time   IRON 61 11/26/2021 0832   TIBC 373 11/26/2021 0832   FERRITIN 29 (L) 11/26/2021 0832   IRONPCTSAT 16 11/26/2021 0832   Lipid Panel     Component Value Date/Time   CHOL 190 10/12/2023 0924   TRIG 47 10/12/2023 0924   HDL 58  10/12/2023 0924   CHOLHDL 3.7 06/01/2023 1347   LDLCALC 123 (H) 10/12/2023 0924   LDLCALC 121 (H) 06/01/2023 1347   Hepatic Function Panel     Component Value Date/Time   PROT 6.6 10/12/2023 0924   ALBUMIN 4.2 10/12/2023 0924   AST 28 10/12/2023 0924   ALT 33 10/12/2023 0924   ALKPHOS 93 10/12/2023 0924   BILITOT 0.4 10/12/2023 0924      Component Value Date/Time   TSH 2.00 06/01/2023 1347   Nutritional Lab Results  Component Value Date   VD25OH 36.9 10/12/2023   VD25OH 48 06/01/2023   VD25OH 44.3 09/15/2022     Assessment and Plan Assessment & Plan Obesity The individual is managing obesity with a daily caloric intake of approximately 2000 calories, focusing on consuming over 100 grams of protein. They achieve this goal 25% of the time and have gained 6 pounds in the last two months. They exercise three times weekly, including weight training. Mindless eating, especially post-restrictive eating, may contribute to weight gain. They plan to emphasize meal planning and mindful eating. - Maintain current exercise regimen, including weight training, three times weekly. - Emphasize meal planning and mindful eating to mitigate mindless eating. - Promote high-protein meals and reduce simple carbohydrates. -2000 kcal and >100gm protein daily  Emotional eating behaviors Mindless eating occurs, particularly after restrictive eating periods. Stress related to job considerations has been present, but they have decided to remain in their current position. They plan to focus on meal planning and mindful eating to address emotional eating. - Emphasize meal planning and mindful eating to address emotional eating behaviors.  Insulin  resistance Efforts to manage insulin  resistance include reducing simple carbohydrates and increasing protein intake, alongside regular exercise. Continuous glucose monitoring provides data on blood sugar levels, revealing unexpected spikes with certain foods,  prompting dietary adjustments. - Continue reducing simple carbohydrates and increasing protein intake. - Maintain regular exercise regimen. - Consider ongoing use of continuous glucose monitoring for blood sugar data.  Vitamin D  deficiency Vitamin D  levels, previously checked in December, had decreased. They are on vitamin D  supplementation and due for a recheck soon. - Continue vitamin D  supplementation. - Recheck vitamin D  levels at the next visit, ensuring fasting.     Alex [name] was informed of the importance of frequent follow up visits to maximize [name]'s success with intensive lifestyle modifications for [name]'s multiple health conditions.    Jasmine Mesi, MD

## 2024-03-17 DIAGNOSIS — F331 Major depressive disorder, recurrent, moderate: Secondary | ICD-10-CM | POA: Diagnosis not present

## 2024-03-17 DIAGNOSIS — F50811 Binge eating disorder, moderate: Secondary | ICD-10-CM | POA: Diagnosis not present

## 2024-03-17 DIAGNOSIS — F411 Generalized anxiety disorder: Secondary | ICD-10-CM | POA: Diagnosis not present

## 2024-03-17 DIAGNOSIS — F900 Attention-deficit hyperactivity disorder, predominantly inattentive type: Secondary | ICD-10-CM | POA: Diagnosis not present

## 2024-03-23 ENCOUNTER — Other Ambulatory Visit: Payer: Self-pay

## 2024-03-23 ENCOUNTER — Other Ambulatory Visit (HOSPITAL_COMMUNITY): Payer: Self-pay

## 2024-04-10 ENCOUNTER — Other Ambulatory Visit (HOSPITAL_COMMUNITY): Payer: Self-pay

## 2024-04-10 ENCOUNTER — Other Ambulatory Visit: Payer: Self-pay

## 2024-04-12 DIAGNOSIS — F900 Attention-deficit hyperactivity disorder, predominantly inattentive type: Secondary | ICD-10-CM | POA: Diagnosis not present

## 2024-04-12 DIAGNOSIS — F331 Major depressive disorder, recurrent, moderate: Secondary | ICD-10-CM | POA: Diagnosis not present

## 2024-04-12 DIAGNOSIS — F411 Generalized anxiety disorder: Secondary | ICD-10-CM | POA: Diagnosis not present

## 2024-04-13 ENCOUNTER — Encounter (INDEPENDENT_AMBULATORY_CARE_PROVIDER_SITE_OTHER): Payer: Self-pay | Admitting: Family Medicine

## 2024-04-13 ENCOUNTER — Ambulatory Visit (INDEPENDENT_AMBULATORY_CARE_PROVIDER_SITE_OTHER): Admitting: Family Medicine

## 2024-04-13 ENCOUNTER — Other Ambulatory Visit (HOSPITAL_COMMUNITY): Payer: Self-pay

## 2024-04-13 VITALS — BP 110/74 | HR 80 | Temp 97.8°F | Ht 70.0 in | Wt 348.0 lb

## 2024-04-13 DIAGNOSIS — E785 Hyperlipidemia, unspecified: Secondary | ICD-10-CM

## 2024-04-13 DIAGNOSIS — E782 Mixed hyperlipidemia: Secondary | ICD-10-CM

## 2024-04-13 DIAGNOSIS — E669 Obesity, unspecified: Secondary | ICD-10-CM | POA: Diagnosis not present

## 2024-04-13 DIAGNOSIS — R0683 Snoring: Secondary | ICD-10-CM | POA: Diagnosis not present

## 2024-04-13 DIAGNOSIS — F3289 Other specified depressive episodes: Secondary | ICD-10-CM | POA: Diagnosis not present

## 2024-04-13 DIAGNOSIS — Z6841 Body Mass Index (BMI) 40.0 and over, adult: Secondary | ICD-10-CM

## 2024-04-13 DIAGNOSIS — E88819 Insulin resistance, unspecified: Secondary | ICD-10-CM

## 2024-04-13 DIAGNOSIS — E538 Deficiency of other specified B group vitamins: Secondary | ICD-10-CM

## 2024-04-13 DIAGNOSIS — E559 Vitamin D deficiency, unspecified: Secondary | ICD-10-CM

## 2024-04-13 DIAGNOSIS — R5383 Other fatigue: Secondary | ICD-10-CM

## 2024-04-13 DIAGNOSIS — D649 Anemia, unspecified: Secondary | ICD-10-CM

## 2024-04-13 MED ORDER — NALTREXONE HCL 50 MG PO TABS
50.0000 mg | ORAL_TABLET | Freq: Every day | ORAL | 0 refills | Status: DC
  Filled 2024-04-13: qty 30, 30d supply, fill #0

## 2024-04-13 MED ORDER — VITAMIN D (ERGOCALCIFEROL) 1.25 MG (50000 UNIT) PO CAPS
50000.0000 [IU] | ORAL_CAPSULE | ORAL | 0 refills | Status: DC
Start: 2024-04-13 — End: 2024-05-23
  Filled 2024-04-13: qty 4, 28d supply, fill #0

## 2024-04-13 NOTE — Progress Notes (Signed)
 Office: [phone]  /  Fax: [phone]  WEIGHT SUMMARY AND BIOMETRICS  Anthropometric Measurements Height: 5' 10 (1.778 m) Weight: (!) 348 lb (157.9 kg) BMI (Calculated): 49.93 Weight at Last Visit: 347 lb Weight Lost Since Last Visit: 0 Weight Gained Since Last Visit: 1 lb Starting Weight: 311 lb Total Weight Loss (lbs): 0 lb (0 kg) Peak Weight: 350 lb   Body Composition  Body Fat %: 42.3 % Fat Mass (lbs): 147.2 lbs Muscle Mass (lbs): 191.2 lbs Total Body Water (lbs): 143.2 lbs Visceral Fat Rating : 29   Other Clinical Data Fasting: yes Labs: yes Today's Visit #: 1 Starting Date: 06/25/21    Chief Complaint: OBESITY   Discussed the use of AI scribe software for clinical note transcription with the patient, who gave verbal consent to proceed.  History of Present Illness Alex [name] is a 43 year old who presents for obesity treatment and progress assessment.  They have been following a journaling plan with a goal of 2000 calories and 100 or more grams of protein, but adherence is about 20%. They engage in strength training for 60 minutes, three times a week, but have gained one pound recently. Challenges with meal planning and impulse control, particularly at work, are noted.  They experience worsening snoring over the past year, leading to being 'ousted from the bedroom' due to noise. Morning neck stiffness can lead to headaches, and frequent awakenings at night occur due to discomfort from sleeping positions. No observed apneic episodes by their spouse.  They have an elevated LDL of 123 and are not on a statin. A history of vitamin D  deficiency is managed with prescription vitamin D . A history of B12 deficiency is managed with a B12-rich diet, though they are not on a supplement.  They experience fatigue and have a history of hemorrhoids, confirmed by two colonoscopies. Banding procedures have provided limited success, and they report sustained  rectal bleeding.  They have a well-controlled A1c of 5.2 but elevated insulin  levels. They describe challenges with meal planning and impulse control, particularly at work where there are many temptations. They try to avoid these by self-removal and making healthier choices when possible.      PHYSICAL EXAM:  Blood pressure 110/74, pulse 80, temperature 97.8 F (36.6 C), height 5' 10 (1.778 m), weight (!) 348 lb (157.9 kg), SpO2 100%. Body mass index is 49.93 kg/m.  DIAGNOSTIC DATA REVIEWED:  BMET    Component Value Date/Time   NA 139 10/12/2023 0924   K 4.6 10/12/2023 0924   CL 104 10/12/2023 0924   CO2 20 10/12/2023 0924   GLUCOSE 85 10/12/2023 0924   GLUCOSE 94 06/01/2023 1347   BUN 14 10/12/2023 0924   CREATININE 0.82 10/12/2023 0924   CREATININE 0.73 06/01/2023 1347   CALCIUM 9.2 10/12/2023 0924   GFRNONAA >60 06/05/2021 1542   GFRAA 119 03/19/2020 0913   Lab Results  Component Value Date   HGBA1C 5.2 10/12/2023   HGBA1C 5.0 06/25/2021   Lab Results  Component Value Date   INSULIN  9.2 10/12/2023   INSULIN  6.5 06/25/2021   Lab Results  Component Value Date   TSH 2.00 06/01/2023   CBC    Component Value Date/Time   WBC 6.4 03/17/2022 0940   WBC 7.0 06/05/2021 1542   RBC 5.20 03/17/2022 0940   RBC 4.91 06/05/2021 1542   HGB 15.3 03/17/2022 0940   HCT 45.7 03/17/2022 0940   PLT 200 03/17/2022 0940   MCV  88 03/17/2022 0940   MCH 29.4 03/17/2022 0940   MCH 27.7 06/05/2021 1542   MCHC 33.5 03/17/2022 0940   MCHC 32.1 06/05/2021 1542   RDW 12.7 03/17/2022 0940   Iron Studies    Component Value Date/Time   IRON 61 11/26/2021 0832   TIBC 373 11/26/2021 0832   FERRITIN 29 (L) 11/26/2021 0832   IRONPCTSAT 16 11/26/2021 0832   Lipid Panel     Component Value Date/Time   CHOL 190 10/12/2023 0924   TRIG 47 10/12/2023 0924   HDL 58 10/12/2023 0924   CHOLHDL 3.7 06/01/2023 1347   LDLCALC 123 (H) 10/12/2023 0924   LDLCALC 121 (H) 06/01/2023 1347    Hepatic Function Panel     Component Value Date/Time   PROT 6.6 10/12/2023 0924   ALBUMIN 4.2 10/12/2023 0924   AST 28 10/12/2023 0924   ALT 33 10/12/2023 0924   ALKPHOS 93 10/12/2023 0924   BILITOT 0.4 10/12/2023 0924      Component Value Date/Time   TSH 2.00 06/01/2023 1347   Nutritional Lab Results  Component Value Date   VD25OH 36.9 10/12/2023   VD25OH 48 06/01/2023   VD25OH 44.3 09/15/2022     Assessment and Plan Assessment & Plan Snoring and Suspected Sleep Apnea Worsening snoring over the past year, morning headaches, and frequent awakenings at night. Increased snoring reported by spouse, but no witnessed apneas. Previous sleep study in 2017 was normal, but symptoms suggest potential sleep apnea. - Refer to Dr. Albertina Hugger for a sleep consultation.  Obesity Adherence to a 2000 calorie and 100g protein journaling plan is about 20%. Engages in 60 minutes of strength training three times a week but has gained one pound recently. Challenges include meal planning and impulse control, especially during weekends at work when unhealthy food is more accessible. - Encourage continued journaling and adherence to the dietary plan. - Discuss strategies for meal planning and impulse control, including the use of ChatGPT for meal planning and efficient meal prep.  Insulin  Resistance Insulin  resistance with a well-controlled A1c of 5.2, but elevated insulin  levels, potentially contributing to weight gain. - Order insulin  level. - Encourage continuation of a low simple carbohydrate diet.  Hyperlipidemia LDL cholesterol previously elevated at 123 mg/dL. Not currently on a statin. Plan to reassess lipid profile to determine current status and need for intervention. - Order lipid panel to reassess cholesterol levels.  Vitamin D  Deficiency Vitamin D  deficiency, currently on prescription vitamin D . Plan to check vitamin D  levels to ensure adequacy. - Order vitamin D  level. - Refill  prescription vitamin D .  B12 Deficiency B12 deficiency, not currently on a B12 supplement but maintains a B12-rich diet. Plan to check B12 levels to ensure adequacy. - Order B12 level.  Fatigue and Anemia Reports fatigue, has not had a CBC checked in a while. Anemia can contribute to fatigue. - Order CBC to assess for anemia.  Hemorrhoids Sustained rectal bleeding, confirmed hemorrhoids via two colonoscopies. Previous banding provided some relief, but symptoms have recurred. - Consider further intervention if necessary.  Follow-up Upcoming appointments scheduled for July 29 and August 26. Recommend scheduling a follow-up appointment in September. - Schedule follow-up appointment in September.      Alex [name] was informed of the importance of frequent follow up visits to maximize Alex [name]'s success with intensive lifestyle modifications for Alex [name]'s multiple health conditions.    Jasmine Mesi, MD

## 2024-04-14 LAB — CBC WITH DIFFERENTIAL/PLATELET
Basophils Absolute: 0 10*3/uL (ref 0.0–0.2)
Basos: 0 %
EOS (ABSOLUTE): 0.1 10*3/uL (ref 0.0–0.4)
Eos: 1 %
Hematocrit: 45.1 % (ref 37.5–51.0)
Hemoglobin: 14.2 g/dL (ref 13.0–17.7)
Immature Grans (Abs): 0 10*3/uL (ref 0.0–0.1)
Immature Granulocytes: 0 %
Lymphocytes Absolute: 1.7 10*3/uL (ref 0.7–3.1)
Lymphs: 29 %
MCH: 27.4 pg (ref 26.6–33.0)
MCHC: 31.5 g/dL (ref 31.5–35.7)
MCV: 87 fL (ref 79–97)
Monocytes Absolute: 0.5 10*3/uL (ref 0.1–0.9)
Monocytes: 8 %
Neutrophils Absolute: 3.5 10*3/uL (ref 1.4–7.0)
Neutrophils: 62 %
Platelets: 239 10*3/uL (ref 150–450)
RBC: 5.19 x10E6/uL (ref 4.14–5.80)
RDW: 13.5 % (ref 11.6–15.4)
WBC: 5.7 10*3/uL (ref 3.4–10.8)

## 2024-04-14 LAB — CMP14+EGFR
ALT: 29 IU/L (ref 0–44)
AST: 22 IU/L (ref 0–40)
Albumin: 4.3 g/dL (ref 4.1–5.1)
Alkaline Phosphatase: 88 IU/L (ref 44–121)
BUN/Creatinine Ratio: 17 (ref 9–20)
BUN: 15 mg/dL (ref 6–24)
Bilirubin Total: 0.4 mg/dL (ref 0.0–1.2)
CO2: 21 mmol/L (ref 20–29)
Calcium: 9.1 mg/dL (ref 8.7–10.2)
Chloride: 101 mmol/L (ref 96–106)
Creatinine, Ser: 0.89 mg/dL (ref 0.76–1.27)
Globulin, Total: 2.4 g/dL (ref 1.5–4.5)
Glucose: 90 mg/dL (ref 70–99)
Potassium: 4.4 mmol/L (ref 3.5–5.2)
Sodium: 139 mmol/L (ref 134–144)
Total Protein: 6.7 g/dL (ref 6.0–8.5)
eGFR: 111 mL/min/{1.73_m2} (ref >59)

## 2024-04-14 LAB — LIPID PANEL WITH LDL/HDL RATIO
Cholesterol, Total: 188 mg/dL (ref 100–199)
HDL: 49 mg/dL (ref >39)
LDL Chol Calc (NIH): 127 mg/dL — ABNORMAL HIGH (ref 0–99)
LDL/HDL Ratio: 2.6 ratio (ref 0.0–3.6)
Triglycerides: 63 mg/dL (ref 0–149)
VLDL Cholesterol Cal: 12 mg/dL (ref 5–40)

## 2024-04-14 LAB — HEMOGLOBIN A1C
Est. average glucose Bld gHb Est-mCnc: 103 mg/dL
Hgb A1c MFr Bld: 5.2 % (ref 4.8–5.6)

## 2024-04-14 LAB — LIPASE: Lipase: 31 U/L (ref 13–78)

## 2024-04-14 LAB — VITAMIN D 25 HYDROXY (VIT D DEFICIENCY, FRACTURES): Vit D, 25-Hydroxy: 48 ng/mL (ref 30.0–100.0)

## 2024-04-14 LAB — VITAMIN B12: Vitamin B-12: 552 pg/mL (ref 232–1245)

## 2024-04-14 LAB — INSULIN, RANDOM: INSULIN: 18.8 u[IU]/mL (ref 2.6–24.9)

## 2024-04-21 ENCOUNTER — Other Ambulatory Visit (HOSPITAL_COMMUNITY): Payer: Self-pay

## 2024-05-15 ENCOUNTER — Other Ambulatory Visit (HOSPITAL_COMMUNITY): Payer: Self-pay

## 2024-05-17 ENCOUNTER — Other Ambulatory Visit (HOSPITAL_COMMUNITY): Payer: Self-pay

## 2024-05-17 ENCOUNTER — Other Ambulatory Visit: Payer: Self-pay

## 2024-05-17 DIAGNOSIS — F331 Major depressive disorder, recurrent, moderate: Secondary | ICD-10-CM | POA: Diagnosis not present

## 2024-05-17 DIAGNOSIS — F50811 Binge eating disorder, moderate: Secondary | ICD-10-CM | POA: Diagnosis not present

## 2024-05-17 DIAGNOSIS — F411 Generalized anxiety disorder: Secondary | ICD-10-CM | POA: Diagnosis not present

## 2024-05-17 MED ORDER — LISDEXAMFETAMINE DIMESYLATE 20 MG PO CAPS
20.0000 mg | ORAL_CAPSULE | Freq: Every morning | ORAL | 0 refills | Status: DC
  Filled 2024-06-16 – 2024-06-22 (×2): qty 30, 30d supply, fill #0

## 2024-05-17 MED ORDER — LISDEXAMFETAMINE DIMESYLATE 20 MG PO CAPS
20.0000 mg | ORAL_CAPSULE | Freq: Every morning | ORAL | 0 refills | Status: DC
  Filled 2024-05-17: qty 30, 30d supply, fill #0

## 2024-05-17 MED ORDER — LISDEXAMFETAMINE DIMESYLATE 20 MG PO CAPS
20.0000 mg | ORAL_CAPSULE | Freq: Every morning | ORAL | 0 refills | Status: DC
  Filled 2024-07-24: qty 30, 30d supply, fill #0

## 2024-05-17 MED ORDER — VILAZODONE HCL 20 MG PO TABS
20.0000 mg | ORAL_TABLET | Freq: Every day | ORAL | 0 refills | Status: DC
  Filled 2024-05-17 – 2024-06-16 (×2): qty 90, 90d supply, fill #0

## 2024-05-23 ENCOUNTER — Ambulatory Visit (INDEPENDENT_AMBULATORY_CARE_PROVIDER_SITE_OTHER): Admitting: Neurology

## 2024-05-23 ENCOUNTER — Encounter (INDEPENDENT_AMBULATORY_CARE_PROVIDER_SITE_OTHER): Payer: Self-pay | Admitting: Family Medicine

## 2024-05-23 ENCOUNTER — Institutional Professional Consult (permissible substitution): Admitting: Neurology

## 2024-05-23 ENCOUNTER — Ambulatory Visit (INDEPENDENT_AMBULATORY_CARE_PROVIDER_SITE_OTHER): Admitting: Family Medicine

## 2024-05-23 ENCOUNTER — Encounter: Payer: Self-pay | Admitting: Neurology

## 2024-05-23 ENCOUNTER — Other Ambulatory Visit (HOSPITAL_COMMUNITY): Payer: Self-pay

## 2024-05-23 VITALS — BP 132/86 | HR 78 | Ht 70.0 in | Wt 353.0 lb

## 2024-05-23 VITALS — BP 109/72 | HR 94 | Temp 98.0°F | Ht 70.0 in | Wt 348.0 lb

## 2024-05-23 DIAGNOSIS — E559 Vitamin D deficiency, unspecified: Secondary | ICD-10-CM | POA: Diagnosis not present

## 2024-05-23 DIAGNOSIS — R0683 Snoring: Secondary | ICD-10-CM | POA: Diagnosis not present

## 2024-05-23 DIAGNOSIS — E7849 Other hyperlipidemia: Secondary | ICD-10-CM

## 2024-05-23 DIAGNOSIS — Z6841 Body Mass Index (BMI) 40.0 and over, adult: Secondary | ICD-10-CM

## 2024-05-23 DIAGNOSIS — R519 Headache, unspecified: Secondary | ICD-10-CM | POA: Insufficient documentation

## 2024-05-23 DIAGNOSIS — Z9189 Other specified personal risk factors, not elsewhere classified: Secondary | ICD-10-CM | POA: Diagnosis not present

## 2024-05-23 DIAGNOSIS — E669 Obesity, unspecified: Secondary | ICD-10-CM | POA: Insufficient documentation

## 2024-05-23 DIAGNOSIS — F5089 Other specified eating disorder: Secondary | ICD-10-CM

## 2024-05-23 DIAGNOSIS — G43109 Migraine with aura, not intractable, without status migrainosus: Secondary | ICD-10-CM | POA: Diagnosis not present

## 2024-05-23 DIAGNOSIS — F3289 Other specified depressive episodes: Secondary | ICD-10-CM

## 2024-05-23 DIAGNOSIS — E66813 Obesity, class 3: Secondary | ICD-10-CM

## 2024-05-23 MED ORDER — NALTREXONE HCL 50 MG PO TABS
100.0000 mg | ORAL_TABLET | Freq: Every day | ORAL | 0 refills | Status: DC
  Filled 2024-05-23: qty 60, 30d supply, fill #0

## 2024-05-23 MED ORDER — VITAMIN D (ERGOCALCIFEROL) 1.25 MG (50000 UNIT) PO CAPS
50000.0000 [IU] | ORAL_CAPSULE | ORAL | 0 refills | Status: DC
Start: 2024-05-23 — End: 2024-06-20
  Filled 2024-05-23: qty 4, 28d supply, fill #0

## 2024-05-23 NOTE — Progress Notes (Signed)
 Office: [phone]  /  Fax: [phone]  WEIGHT SUMMARY AND BIOMETRICS  Anthropometric Measurements Height: 5' 10 (1.778 m) Weight: (!) 348 lb (157.9 kg) BMI (Calculated): 49.93 Weight at Last Visit: 348 lb Weight Lost Since Last Visit: 0 Weight Gained Since Last Visit: 0 Starting Weight: 311 lb Total Weight Loss (lbs): 0 lb (0 kg) Peak Weight: 350 lb   Body Composition  Body Fat %: 42 % Fat Mass (lbs): 146.4 lbs Muscle Mass (lbs): 192.6 lbs Total Body Water (lbs): 141.4 lbs Visceral Fat Rating : 28   Other Clinical Data Fasting: no Labs: no Today's Visit #: 44 Starting Date: 06/25/21    Chief Complaint: OBESITY   Discussed the use of AI scribe software for clinical note transcription with the patient, who gave verbal consent to proceed.  History of Present Illness Dia Jefferys Dashan Chizmar is a 43 year old presenting for obesity treatment and progress assessment.  They have been managing obesity through dietary changes and physical activity, maintaining their weight for the last five weeks. They journal their food intake about 50% of the time, aiming for 2000 calories and 100 grams of protein daily.  Their physical activities include weightlifting, yard work, and Aeronautical engineer. Recently, they have been moving mulch for about two hours daily in the heat, causing soreness in their back, arms, and thighs.  They are taking naltrexone  for emotional eating behaviors, noting subtle effects and no gastrointestinal upset beyond baseline symptoms. They are on Viibryd  for depression and anxiety, which helps avoid depressive episodes and manage anxiety. They have shifted from depression in youth to more anxiety recently.  Recent labs show excellent triglyceride levels and an LDL of 127. Their father has been on and off cholesterol medication, but there is no known family history of heart disease. They are not on cholesterol medication.  They are preparing for a sleep study  consultation to address potential sleep issues, having had a previous study in 2017 with home and in-office components. No new medical problems reported.      PHYSICAL EXAM:  Blood pressure 109/72, pulse 94, temperature 98 F (36.7 C), height 5' 10 (1.778 m), weight (!) 348 lb (157.9 kg), SpO2 96%. Body mass index is 49.93 kg/m.  DIAGNOSTIC DATA REVIEWED:  BMET    Component Value Date/Time   NA 139 04/13/2024 1025   K 4.4 04/13/2024 1025   CL 101 04/13/2024 1025   CO2 21 04/13/2024 1025   GLUCOSE 90 04/13/2024 1025   GLUCOSE 94 06/01/2023 1347   BUN 15 04/13/2024 1025   CREATININE 0.89 04/13/2024 1025   CREATININE 0.73 06/01/2023 1347   CALCIUM 9.1 04/13/2024 1025   GFRNONAA >60 06/05/2021 1542   GFRAA 119 03/19/2020 0913   Lab Results  Component Value Date   HGBA1C 5.2 04/13/2024   HGBA1C 5.0 06/25/2021   Lab Results  Component Value Date   INSULIN  18.8 04/13/2024   INSULIN  6.5 06/25/2021   Lab Results  Component Value Date   TSH 2.00 06/01/2023   CBC    Component Value Date/Time   WBC 5.7 04/13/2024 1025   WBC 7.0 06/05/2021 1542   RBC 5.19 04/13/2024 1025   RBC 4.91 06/05/2021 1542   HGB 14.2 04/13/2024 1025   HCT 45.1 04/13/2024 1025   PLT 239 04/13/2024 1025   MCV 87 04/13/2024 1025   MCH 27.4 04/13/2024 1025   MCH 27.7 06/05/2021 1542   MCHC 31.5 04/13/2024 1025   MCHC 32.1 06/05/2021 1542  RDW 13.5 04/13/2024 1025   Iron Studies    Component Value Date/Time   IRON 61 11/26/2021 0832   TIBC 373 11/26/2021 0832   FERRITIN 29 (L) 11/26/2021 0832   IRONPCTSAT 16 11/26/2021 0832   Lipid Panel     Component Value Date/Time   CHOL 188 04/13/2024 1025   TRIG 63 04/13/2024 1025   HDL 49 04/13/2024 1025   CHOLHDL 3.7 06/01/2023 1347   LDLCALC 127 (H) 04/13/2024 1025   LDLCALC 121 (H) 06/01/2023 1347   Hepatic Function Panel     Component Value Date/Time   PROT 6.7 04/13/2024 1025   ALBUMIN 4.3 04/13/2024 1025   AST 22 04/13/2024 1025    ALT 29 04/13/2024 1025   ALKPHOS 88 04/13/2024 1025   BILITOT 0.4 04/13/2024 1025      Component Value Date/Time   TSH 2.00 06/01/2023 1347   Nutritional Lab Results  Component Value Date   VD25OH 48.0 04/13/2024   VD25OH 36.9 10/12/2023   VD25OH 48 06/01/2023     Assessment and Plan Assessment & Plan Obesity Weight has been well-managed for the last five weeks. Engaging in physical activities, including weightlifting and extensive yard work. Journaling dietary intake about 25% of the time, with a goal of 2000 calories and 100 grams of protein per day.  - Encourage more consistent journaling of dietary intake to improve accuracy. - Continue current exercise regimen, including weightlifting and yard work.  Elevated LDL cholesterol LDL cholesterol is elevated at 127 mg/dL, concerning for potential plaque buildup in arteries. Despite good triglyceride levels and HDL, LDL remains a challenge, likely due to genetic factors. Not currently on any cholesterol medication. Discussed potential need for medication to prevent future cardiovascular events. Explained the difference between stable and unstable plaque and the role of cholesterol medication in stabilizing plaque to prevent heart attacks. - Consider starting cholesterol medication if LDL remains elevated at the end of the year. - Reassess LDL cholesterol levels at the end of the year.  Depression and anxiety with EEB On Viibryd  for about a year with good control of depression and anxiety symptoms without side effects. Describes medication as providing the ability to recognize and avoid depressive episodes. Sees a therapist monthly. He is working on decreasing his emotional eating behaviors Considering increasing naltrexone  dosage to enhance its effectiveness in managing emotional eating behaviors. - Increase naltrexone  dosage to 100 mg, split into two doses per day. - Continue Viibryd  as currently prescribed. - Continue monthly  therapy sessions. - Will continue to monitor EEB      [name] was informed of the importance of frequent follow up visits to maximize [name]'s success with intensive lifestyle modifications for [name]'s multiple health conditions.    Louann Penton, MD

## 2024-05-23 NOTE — Patient Instructions (Addendum)
 Fatigue If you have fatigue, you feel tired all the time and have a lack of energy or a lack of motivation. Fatigue may make it difficult to start or complete tasks because of exhaustion. Occasional or mild fatigue is often a normal response to activity or life. However, long-term (chronic) or extreme fatigue may be a symptom of a medical condition such as: Depression. Not having enough red blood cells or hemoglobin in the blood (anemia). A problem with a small gland located in the lower front part of the neck (thyroid  disorder). Rheumatologic conditions. These are problems related to the body's defense system (immune system). Infections, especially certain viral infections. Fatigue can also lead to negative health outcomes over time. Follow these instructions at home: Medicines Take over-the-counter and prescription medicines only as told by your health care provider. Take a multivitamin if told by your health care provider. Do not use herbal or dietary supplements unless they are approved by your health care provider. Eating and drinking  Avoid heavy meals in the evening. Eat a well-balanced diet, which includes lean proteins, whole grains, plenty of fruits and vegetables, and low-fat dairy products. Avoid eating or drinking too many products with caffeine in them. Avoid alcohol. Drink enough fluid to keep your urine pale yellow. Activity  Exercise regularly, as told by your health care provider. Use or practice techniques to help you relax, such as yoga, tai chi, meditation, or massage therapy. Lifestyle Change situations that cause you stress. Try to keep your work and personal schedules in balance. Do not use recreational or illegal drugs. General instructions Monitor your fatigue for any changes. Go to bed and get up at the same time every day. Avoid fatigue by pacing yourself during the day and getting enough sleep at night. Maintain a healthy weight. Contact a health care  provider if: Your fatigue does not get better. You have a fever. You suddenly lose or gain weight. You have headaches. You have trouble falling asleep or sleeping through the night. You feel angry, guilty, anxious, or sad. You have swelling in your legs or another part of your body. Get help right away if: You feel confused, feel like you might faint, or faint. Your vision is blurry or you have a severe headache. You have severe pain in your abdomen, your back, or the area between your waist and hips (pelvis). You have chest pain, shortness of breath, or an irregular or fast heartbeat. You are unable to urinate, or you urinate less than normal. You have abnormal bleeding from the rectum, nose, lungs, nipples, or, if you are male, the vagina. You vomit blood. You have thoughts about hurting yourself or others. These symptoms may be an emergency. Get help right away. Call 911. Do not wait to see if the symptoms will go away. Do not drive yourself to the hospital. Get help right away if you feel like you may hurt yourself or others, or have thoughts about taking your own life. Go to your nearest emergency room or: Call 911. Call the National Suicide Prevention Lifeline at 580-591-7739 or 988. This is open 24 hours a day. Text the Crisis Text Line at 424-366-1042. Summary If you have fatigue, you feel tired all the time and have a lack of energy or a lack of motivation. Fatigue may make it difficult to start or complete tasks because of exhaustion. Long-term (chronic) or extreme fatigue may be a symptom of a medical condition. Exercise regularly, as told by your health care provider.  Change situations that cause you stress. Try to keep your work and personal schedules in balance. This information is not intended to replace advice given to you by your health care provider. Make sure you discuss any questions you have with your health care provider. Document Revised: 08/04/2021 Document  Reviewed: 08/04/2021 Elsevier Patient Education  2024 [employer]    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 BMI now over 50.   Loud snoring, but no witnessed apneas.  Morning headaches  Postnasal drip , waking up coughing.   Shift work history ( OR Engineer, civil (consulting) ).  I will look for the possible presence of OSA by HST.   I plan to follow up on the scheduled HST  if positive for apnea / hypoxia either personally or through our NP within 4-5 months.

## 2024-05-23 NOTE — Progress Notes (Signed)
 SLEEP MED[facility]INE [facility]IC    Provider:  Dedra Gores, MD  Primary Care Physician:  Allwardt, Mardy HERO, PA-C [address] [place] [address]     Referring Provider: Verdon Louann BIRCH, Md [address] [place],  [place] [address]          Chief Complaint according to patient   Patient presents with:     New Patient (Initial Visit)           HISTORY OF PRESENT ILLNESS:  Alex [name] is a 43 y.o. adult patient who is seen upon referral from Dr Verdon on 05/23/2024 . Chief concern according to patient :  Last seen in 2017, had a  sleep study here and was not diagnosed with OSA . He reports he had one of the best nights of sleep in the sleep lab.  There was no follow up needed. AHI was 0/h. No oxygen desaturations.        Sleep relevant medical history: shift work as OR Transport planner, Obesity -  BMI 50. 6, snoring has worsened, he sleeps in a seperate bedroom now.  Nocturia- none,  Asthma now more severe on inhaler, no dental or  ENT surgeries,     Family medical /sleep history: Mother  on CPAP with OSA,  father is untreated.  Social history:  Patient is working as Charity fundraiser , OR, working shifts are sat / sun/ Monday a total of 36 hours-  and lives in a household with wife and daughter , now 31.   Tobacco use; none .  ETOH use ; some on week ends- ,  Caffeine intake in form of Coffee( 2 mugs ( large ) in AM ) Soda(  with lunch and dinner ) Tea ( /) or energy drinks    Sleep habits are as follows: The patient's dinner time is between 5-6 PM. The patient goes to bed on non work days- he cooks  dinner. Bedtime  at 9 PM and continues to sleep for 7 hours, not waking-up for bathroom breaks, but to change positions  The preferred sleep position is lateral , with the support of 2 pillows  flat bed- Dreams are reportedly infrequent.   The patient wakes up on work days with an alarm. 5  AM is the usual rise time. He reports not feeling refreshed or restored in AM, with  symptoms such as dry mouth, morning headaches ( 2-3 times a week !) dull pressure , base of the head  and works its way up, nausea  if it progresses, then followed by phonophobia, photophobia and remain nauseated for an hour.   Naps are taken infrequently I don't nap well and wake up not refreshed .     03-04-2016 Chief complaint according to patient :  I have been snoring and was witnessed to have crescendo breathing, but HST was inconclusive    03-04-2016 : Alex [name] is here today upon request of his primary care physician, he has been fatigued for quite a while. Alex [name] also has noticed that he sometimes when trying to take a nap feels actually worse not refreshed, not restored but craving more. Plus this may actually detract from his nocturnal sleep. He has always been somewhat sleep drunken from nap time even as a child. Dr. Verdia suspected that the patient may have obstructive sleep apnea based on his BMI, his Mallampati, and his symptoms including erectile dysfunction ( from SSRI )  , later azoospermia and still  having residual depressive disorder. The patient work is as an Scientist, forensic, is a Education officer, museum . Sleep habits are as follows:  The patient usually goes to bed around 9 PM but may not fall asleep promptly. He likes to read in bed. His bedroom otherwise is cool, quiet and dark. His alarm is set for 5:40 AM but he hits the snooze button a couple of times before getting up. He has recently noticed that his nights a more fragmented he turns over wakes up a couple of times but he does not look at a clock. He does not feel that his sleep has worsened. When he gets up in the morning he has a slightly dry mouth he does not wake up with headaches or from headaches. He sleeps on one pillow. He prefers to sleep on his side. He does not have back pain or other discomfort no shortness of breath no coughing and no restless legs.   Sleep medical history : ADHD , Obesity,  no HTN, no DM, no  Thyroid  dx and family sleep history: father has OSA but never got diagnosed.  Social history:  Married, no children. No tobacco use ever, he grew up  with smoking parents.  ETOH; 1-2 drinks a week, caffeine ; 3-4 cups a day, tea and coffee. No Soda.   Interval history from 03/04/2016.  Alex [name] is here today to follow-up on his recent sleep study performed on March 23. His deep study showed a rare results of 0.0 apneas for the whole night. He spent about 35% of the total sleep time in supine position and still did not produce any apneas he had very rare periodic limb movements little bit of clicking his average heart rate was 67 bpm in sinus rhythm. It a normal sleep EEG. I would like to mentioned that he did not snore. The sleep architecture was excellent, sleep was neither fragmented nor lacking specific sleep phases. The patient entered slow wave sleep as well as REM sleep for a total of 4 REM sleep cycles beginning before midnight and becoming longer and shorter intervals into the morning hours.       Review of Systems: Out of a complete 14 system review, the patient complains of only the following symptoms, and all other reviewed systems are negative.:  Fatigue, sleepiness , snoring, headaches in AM.    How likely are you to doze in the following situations: 0 = not likely, 1 = slight chance, 2 = moderate chance, 3 = high chance   Sitting and Reading? Watching Television? Sitting inactive in a public place (theater or meeting)? As a passenger in a car for an hour without a break? Lying down in the afternoon when circumstances permit? Sitting and talking to someone? Sitting quietly after lunch without alcohol? In a car, while stopped for a few minutes in traffic?   Total = 8/ 24 points   FSS endorsed at 47/ 63 points.   Social History   Socioeconomic History   Marital status: Married    Spouse name: Not on file   Number of children: One daughter , born 2019    Years of  education: Not on file   Highest education level: Not on file  Occupational History   Not on file  Tobacco Use   Smoking status: Never   Smokeless tobacco: Never  Vaping Use   Vaping status: Never Used  Substance and Sexual Activity   Alcohol use: Not Currently    Comment: occasional  Drug use: No   Sexual activity: Yes    Birth control/protection: None  Other Topics Concern   Not on file  Social History Narrative   2025- Drinks about 4 cups of caffeine daily.   Social Drivers of Corporate investment banker Strain: Not on file  Food Insecurity: Not on file  Transportation Needs: Not on file  Physical Activity: Not on file  Stress: Not on file  Social Connections: Not on file    Family History  Problem Relation Age of Onset   Anxiety disorder Mother    Depression Mother    Obesity Mother    Sleep apnea Mother    Obesity Father    Hyperlipidemia Father    Hypertension Father    Depression Father    Anxiety disorder Father    Asthma Father    Cancer Maternal Grandfather        skin & lung   Emphysema Maternal Grandfather    Cancer Paternal Grandfather        leukemia    Past Medical History:  Diagnosis Date   Anxiety    Arterial embolism of left leg (HCC)    Asthma    exercise-induced   Constipation    Depression    Infertility male    Palpitation    SOB (shortness of breath)    Vitamin D  deficiency     Past Surgical History:  Procedure Laterality Date   COLONOSCOPY  2017   bleeding - negative scope   WISDOM TOOTH EXTRACTION       Current Outpatient Medications on File Prior to Visit  Medication Sig Dispense Refill   albuterol  (VENTOLIN  HFA) 108 (90 Base) MCG/ACT inhaler Inhale 1-2 puffs into the lungs every 6 (six) hours as needed for wheezing or shortness of breath. 6.7 g 11   benzonatate  (TESSALON ) 100 MG capsule Take 1 capsule (100 mg total) by mouth 3 (three) times daily as needed for cough. 30 capsule 0   Fexofenadine HCl (ALLEGRA PO) Take  by mouth.     fluticasone  (FLOVENT  HFA) 110 MCG/ACT inhaler Inhale 1 puff into the lungs in the morning and at bedtime. 12 g 11   hydrocortisone -pramoxine (ANALPRAM -HC) 2.5-1 % rectal cream Place 1 Application rectally 3 (three) times daily for 7 days 30 g 3   hydrOXYzine  (ATARAX ) 25 MG tablet Take 1 tablet (25 mg total) daily as needed for anxiety. Take 2 tablets (50 mg total) by mouth at bedtime as needed for sleep 30 tablet 2   lisdexamfetamine (VYVANSE ) 20 MG capsule Take 1 capsule (20 mg total) by mouth every morning. 30 capsule 0   lisdexamfetamine (VYVANSE ) 20 MG capsule Take 1 capsule (20 mg total) by mouth in the morning. 30 capsule 0   lisdexamfetamine (VYVANSE ) 20 MG capsule Take 1 capsule (20 mg total) by mouth in the morning. 30 capsule 0   lisdexamfetamine (VYVANSE ) 20 MG capsule Take 1 capsule (20 mg total) by mouth every morning. 30 capsule 0   lisdexamfetamine (VYVANSE ) 20 MG capsule Take 1 capsule (20 mg total) by mouth every morning. 30 capsule 0   lisdexamfetamine (VYVANSE ) 20 MG capsule Take 1 capsule (20 mg total) by mouth every morning. 30 capsule 0   [START ON 07/16/2024] lisdexamfetamine (VYVANSE ) 20 MG capsule Take 1 capsule (20 mg total) by mouth every morning. 30 capsule 0   lisdexamfetamine (VYVANSE ) 20 MG capsule Take 1 capsule (20 mg total) by mouth every morning. 30 capsule 0   [START ON  06/16/2024] lisdexamfetamine (VYVANSE ) 20 MG capsule Take 1 capsule (20 mg total) by mouth every morning. 30 capsule 0   Melatonin 5 MG CHEW Chew by mouth at bedtime.     meloxicam  (MOBIC ) 15 MG tablet Take 1 tablet (15 mg total) by mouth daily. 30 tablet 0   Multiple Vitamin (MULTIVITAMIN) tablet Take 1 tablet by mouth daily.     mupirocin  ointment (BACTROBAN ) 2 % Apply 1 Application topically 2 (two) times daily. 22 g 11   naltrexone  (DEPADE) 50 MG tablet Take 1 tablet (50 mg total) by mouth daily. 30 tablet 0   ondansetron  (ZOFRAN -ODT) 4 MG disintegrating tablet Take 1 tablet (4 mg  total) by mouth every 8 (eight) hours as needed for nausea or vomiting. 20 tablet 0   promethazine -dextromethorphan (PROMETHAZINE -DM) 6.25-15 MG/5ML syrup Take 5 mLs by mouth 4 (four) times daily as needed for cough. 240 mL 0   SUMAtriptan  (IMITREX ) 50 MG tablet Take 1 tablet (50 mg total) by mouth daily as needed for migraine. May repeat in 1-2 hours if headache persists or recurs. 10 tablet 3   Vilazodone  HCl 20 MG TABS Take 1 tablet (20 mg total) by mouth daily. 90 tablet 0   Vitamin D , Ergocalciferol , (DRISDOL ) 1.25 MG (50000 UNIT) CAPS capsule Take 1 capsule (50,000 Units total) by mouth every 7 (seven) days. 4 capsule 0   No current facility-administered medications on file prior to visit.    No Known Allergies   DIAGNOSTIC DATA (LABS, IMAGING, TESTING) - I reviewed patient records, labs, notes, testing and imaging myself where available.  Lab Results  Component Value Date   WBC 5.7 04/13/2024   HGB 14.2 04/13/2024   HCT 45.1 04/13/2024   MCV 87 04/13/2024   PLT 239 04/13/2024      Component Value Date/Time   NA 139 04/13/2024 1025   K 4.4 04/13/2024 1025   CL 101 04/13/2024 1025   CO2 21 04/13/2024 1025   GLUCOSE 90 04/13/2024 1025   GLUCOSE 94 06/01/2023 1347   BUN 15 04/13/2024 1025   CREATININE 0.89 04/13/2024 1025   CREATININE 0.73 06/01/2023 1347   CALCIUM 9.1 04/13/2024 1025   PROT 6.7 04/13/2024 1025   ALBUMIN 4.3 04/13/2024 1025   AST 22 04/13/2024 1025   ALT 29 04/13/2024 1025   ALKPHOS 88 04/13/2024 1025   BILITOT 0.4 04/13/2024 1025   GFRNONAA >60 06/05/2021 1542   GFRAA 119 03/19/2020 0913   Lab Results  Component Value Date   CHOL 188 04/13/2024   HDL 49 04/13/2024   LDLCALC 127 (H) 04/13/2024   TRIG 63 04/13/2024   CHOLHDL 3.7 06/01/2023   Lab Results  Component Value Date   HGBA1C 5.2 04/13/2024   Lab Results  Component Value Date   VITAMINB12 552 04/13/2024   Lab Results  Component Value Date   TSH 2.00 06/01/2023    PHYSICAL  EXAM:  Today's Vitals   05/23/24 1139  BP: 132/86  Pulse: 78  Weight: (!) 353 lb (160.1 kg)  Height: 5' 10 (1.778 m)   Body mass index is 50.65 kg/m.   Wt Readings from Last 3 Encounters:  05/23/24 (!) 353 lb (160.1 kg)  05/23/24 (!) 348 lb (157.9 kg)  04/13/24 (!) 348 lb (157.9 kg)     Ht Readings from Last 3 Encounters:  05/23/24 5' 10 (1.778 m)  05/23/24 5' 10 (1.778 m)  04/13/24 5' 10 (1.778 m)      General: The patient is awake, alert  and appears not in acute distress. The patient is well groomed. Facial hair.  Head: Normocephalic, atraumatic. Neck is supple. Mallampati 2  neck circumference: 18. Nasal airflow non restricted, no nasal septal deviation ,Retrognathia is not seen. Full facial hair.   Cardiovascular:  Regular rate and rhythm , without  murmurs or carotid bruit, and without distended neck veins. Respiratory: Lungs are clear to auscultation. Skin:  Without evidence of edema, or rash  Dental status, wears a bruxism guard. , doesn't help with snoring.  No TMJ,  Trunk: BMI is  50.67, up from 46.5 in 2017,  324 lbs.  Neurologic exam : The patient is awake and alert, oriented to place and time.     Cranial nerves: Pupils are equal and briskly reactive to light. Funduscopic exam without evidence of pallor or edema.  Extraocular movements  in vertical and horizontal planes intact and without nystagmus. Visual fields by finger perimetry are intact. Hearing to finger rub intact. Facial sensation intact to fine touch. Facial motor strength is symmetric and tongue and uvula move midline. Shoulder shrug was symmetrical.  Motor exam:  Symmetric bulk, tone and ROM.   Normal tone without cog wheeling, symmetric grip strength .   Sensory:  Fine touch, pinprick and vibration were tested  and  normal.  Proprioception tested in the upper extremities was normal.   Coordination: No t evidence of ataxia, dysmetria or tremor.   Gait and station: Patient could rise  unassisted from a seated position, walked without assistive device.   Deep tendon reflexes: in the  upper and lower extremities are symmetric and intact.      ASSESSMENT AND PLAN 43 y.o. year old adult Male patient with  louder snoring and  increased fatigued, weight gain , who tested negative for apnea in an in lab sleep study in the year 2017 ,  here with:    1) high risk factor for OSA/ OHV. BMI now over 50.   Loud snoring, but no witnessed apneas.  Morning headaches  Postnasal drip , waking up coughing.    The patient had 3 times Covid, had felt more fatigued after these infections, had palpitations, SOB.  Shift work history ( OR Engineer, civil (consulting) ).  I will look for the possible presence of OSA by HST.   I plan to follow up on the scheduled HST  if positive for apnea / hypoxia either personally or through our NP within 4-5 months.   I would like to thank Allwardt, Mardy HERO, PA-C and Verdon Parry D, Md [address] [place],  [place] [address] for allowing me to meet with and to take care of this pleasant patient.   Discussion of sleep hygiene setting bedtime and rise time,  hot shower  before bed time, no screen light in the bedroom, the bedroom should be cool, quiet and dark. Night lights should illuminate the floor not shine into your eyes. Golden glow  light is less intrusive than blue or cold light.  Read in a book with pages, not on a device. Consider audio books and soothing  sound -scapes.    After spending a total time of  35  minutes face to face and additional time for physical and neurologic examination, review of laboratory studies,  personal review of imaging studies, reports and results of other testing and review of referral information / records as far as provided in visit,   Electronically signed by: Dedra Gores, MD 05/23/2024 12:07 PM  [facility] and  [place] [facility] certified by [employer] of Sleep Medicine and Diplomate of  the [employer] of Sleep Medicine. Board certified In Neurology through the ABPN, Fellow of the [employer] of Neu[employer].

## 2024-05-25 DIAGNOSIS — F411 Generalized anxiety disorder: Secondary | ICD-10-CM | POA: Diagnosis not present

## 2024-05-25 DIAGNOSIS — F331 Major depressive disorder, recurrent, moderate: Secondary | ICD-10-CM | POA: Diagnosis not present

## 2024-05-25 DIAGNOSIS — F900 Attention-deficit hyperactivity disorder, predominantly inattentive type: Secondary | ICD-10-CM | POA: Diagnosis not present

## 2024-06-01 ENCOUNTER — Ambulatory Visit (INDEPENDENT_AMBULATORY_CARE_PROVIDER_SITE_OTHER): Payer: 59 | Admitting: Physician Assistant

## 2024-06-01 ENCOUNTER — Encounter: Payer: Self-pay | Admitting: Physician Assistant

## 2024-06-01 VITALS — BP 110/66 | HR 83 | Temp 97.5°F | Ht 70.47 in | Wt 351.6 lb

## 2024-06-01 DIAGNOSIS — E7800 Pure hypercholesterolemia, unspecified: Secondary | ICD-10-CM

## 2024-06-01 DIAGNOSIS — Z0000 Encounter for general adult medical examination without abnormal findings: Secondary | ICD-10-CM | POA: Diagnosis not present

## 2024-06-01 DIAGNOSIS — Z23 Encounter for immunization: Secondary | ICD-10-CM | POA: Diagnosis not present

## 2024-06-01 NOTE — Progress Notes (Signed)
 Patient ID: Alex [name], adult    DOB: Mar 09, 1981, 43 y.o.   MRN: [id number]   Assessment & Plan:  Annual physical exam  Need for pneumococcal vaccine -     Pneumococcal conjugate vaccine 20-valent  Elevated LDL cholesterol level    Assessment & Plan Adult Wellness Visit Routine adult wellness visit with no acute issues. Blood pressure is well-controlled. Engages in regular physical activity, including weightlifting and yard work. No chest pain or shortness of breath. Vision slightly declining but within normal range. No suspicious moles or skin changes. Uses albuterol  inhaler as needed. - Schedule annual wellness visit in one year. - Continue current exercise regimen. - Monitor vision changes. - Continue using albuterol  inhaler as needed.  Elevated LDL cholesterol LDL cholesterol is slightly elevated but not at a level requiring immediate intervention. No significant family history of cardiac issues, though there is a history of strokes in extended family. Discussed potential future need for cholesterol medication if levels continue to rise. - Monitor LDL cholesterol levels. - Consider cholesterol medication if LDL levels increase significantly.   Patient Counseling: [x]   Nutrition: Stressed importance of moderation in sodium/caffeine intake, saturated fat and cholesterol, caloric balance, sufficient intake of fresh fruits, vegetables, and fiber.  [x]   Stressed the importance of regular exercise.   [x]   Substance Abuse: Discussed cessation/primary prevention of tobacco, alcohol, or other drug use; driving or other dangerous activities under the influence; availability of treatment for abuse.   []   Injury prevention: Discussed safety belts, safety helmets, smoke detector, smoking near bedding or upholstery.   []   Sexuality: Discussed sexually transmitted diseases, partner selection, use of condoms, avoidance of unintended pregnancy  and contraceptive alternatives.   [x]    Dental health: Discussed importance of regular tooth brushing, flossing, and dental visits.  [x]   Health maintenance and immunizations reviewed. Please refer to Health maintenance section.       Return in about 1 year (around 06/01/2025) for physical.    Subjective:    Chief Complaint  Patient presents with   Annual Exam    Pt in office for annual CPE and fasting labs completed in June with [facility]; recently seen sleep center to get testing done for possible sleep apnea; pt had pneumonia last winter and showing increased risk, pt opted for Prevnar 20 vaccine today     HPI Discussed the use of AI scribe software for clinical note transcription with the patient, who gave verbal consent to proceed.  History of Present Illness Travis Mastel Jerrad Mendibles is a 43 year old here for an annual physical exam.  Cholesterol levels have been generally good, except for a slowly increasing LDL. There is no significant family history of cardiac issues, although their father had similar cholesterol concerns and managed it with weight loss. Their grandfather died of leukemia, possibly related to chemical exposure, and there is a family history of strokes occurring in the seventies.  They have a history of neck issues, which were addressed with physical therapy and consultation with a sports medicine doctor. The treatment included a regimen of exercises and massages, which have resolved the issue. They attribute the neck tension to poor sleeping positions and perform daily exercises to prevent headaches.  They maintain a regular physical activity routine, including a strict weightlifting regimen two to three times a week for 45 to 50 minutes. Recently, they have been engaged in yard work, which has increased their physical activity. No chest pain or shortness of breath, and  they are meeting their step goals.  They use an albuterol  inhaler as a rescue medication. They have a history of a raised mole removed at  age 70, and their maternal grandfather had melanoma. They have no current suspicious moles.  They experience leg swelling, which is managed with compression socks during work. No significant changes in swelling.     Past Medical History:  Diagnosis Date   Anxiety    Arterial embolism of left leg (HCC)    Asthma    exercise-induced   Constipation    Depression    Infertility male    Palpitation    SOB (shortness of breath)    Vitamin D  deficiency     Past Surgical History:  Procedure Laterality Date   COLONOSCOPY  2017   bleeding - negative scope   WISDOM TOOTH EXTRACTION      Family History  Problem Relation Age of Onset   Anxiety disorder Mother    Depression Mother    Obesity Mother    Sleep apnea Mother    Obesity Father    Hyperlipidemia Father    Hypertension Father    Depression Father    Anxiety disorder Father    Asthma Father    Cancer Maternal Grandfather        skin & lung   Emphysema Maternal Grandfather    Melanoma Maternal Grandfather    Cancer Paternal Grandfather        leukemia   Obesity Maternal Aunt     Social History   Tobacco Use   Smoking status: Never   Smokeless tobacco: Never  Vaping Use   Vaping status: Never Used  Substance Use Topics   Alcohol use: Yes    Alcohol/week: 2.0 standard drinks of alcohol    Types: 2 Shots of liquor per week    Comment: occasional   Drug use: No     No Known Allergies  Review of Systems NEGATIVE UNLESS OTHERWISE INDICATED IN HPI      Objective:     BP 110/66 (BP Location: Left Arm, Patient Position: Sitting, Cuff Size: Large)   Pulse 83   Temp (!) 97.5 F (36.4 C) (Temporal)   Ht 5' 10.47 (1.79 m)   Wt (!) 351 lb 9.6 oz (159.5 kg)   SpO2 97%   BMI 49.78 kg/m   Wt Readings from Last 3 Encounters:  06/01/24 (!) 351 lb 9.6 oz (159.5 kg)  05/23/24 (!) 353 lb (160.1 kg)  05/23/24 (!) 348 lb (157.9 kg)    BP Readings from Last 3 Encounters:  06/01/24 110/66  05/23/24 132/86   05/23/24 109/72     Physical Exam Vitals and nursing note reviewed.  Constitutional:      General: [name] is not in acute distress.    Appearance: Normal appearance. [name] is obese. [name] is not toxic-appearing.  HENT:     Head: Normocephalic and atraumatic.     Right Ear: Tympanic membrane, ear canal and external ear normal.     Left Ear: Tympanic membrane, ear canal and external ear normal.     Nose: Nose normal.     Mouth/Throat:     Mouth: Mucous membranes are moist.     Pharynx: Oropharynx is clear.  Eyes:     Extraocular Movements: Extraocular movements intact.     Conjunctiva/sclera: Conjunctivae normal.     Pupils: Pupils are equal, round, and reactive to light.  Cardiovascular:     Rate and Rhythm: Normal rate and regular  rhythm.     Pulses: Normal pulses.     Heart sounds: Normal heart sounds.  Pulmonary:     Effort: Pulmonary effort is normal.     Breath sounds: Normal breath sounds.  Abdominal:     General: Abdomen is flat. Bowel sounds are normal.     Palpations: Abdomen is soft.     Tenderness: There is no abdominal tenderness.  Musculoskeletal:        General: Normal range of motion.     Cervical back: Normal range of motion and neck supple.     Right lower leg: No edema.     Left lower leg: No edema.  Skin:    General: Skin is warm and dry.     Findings: No lesion or rash.  Neurological:     General: No focal deficit present.     Mental Status: [name] is alert and oriented to person, place, and time.  Psychiatric:        Mood and Affect: Mood normal.        Behavior: Behavior normal.             Tawonda Legaspi M Danya Spearman, PA-C

## 2024-06-13 ENCOUNTER — Ambulatory Visit (INDEPENDENT_AMBULATORY_CARE_PROVIDER_SITE_OTHER): Admitting: Neurology

## 2024-06-13 DIAGNOSIS — E66813 Obesity, class 3: Secondary | ICD-10-CM

## 2024-06-13 DIAGNOSIS — Z9189 Other specified personal risk factors, not elsewhere classified: Secondary | ICD-10-CM

## 2024-06-13 DIAGNOSIS — G4733 Obstructive sleep apnea (adult) (pediatric): Secondary | ICD-10-CM

## 2024-06-13 DIAGNOSIS — R519 Headache, unspecified: Secondary | ICD-10-CM

## 2024-06-13 DIAGNOSIS — G43109 Migraine with aura, not intractable, without status migrainosus: Secondary | ICD-10-CM

## 2024-06-14 NOTE — Progress Notes (Signed)
 [facility]  Alex [name] 43 year old nonbinary Oct 01, 1981   HOME SLEEP TEST REPORT ( by Watch PAT)   STUDY DATE:  06-13-2024   ORDERING CLINICIAN: Dedra Gores, MD  REFERRING CLINICIAN:  Louann Penton, MD [facility]   CLINICAL INFORMATION/HISTORY: Alex [name] is a 43 y.o. adult patient who is seen upon referral from Dr Penton on 05/23/2024 . Chief concern according to patient :  Last seen in 2017, had a  sleep study here and was not diagnosed with OSA . He reports he had one of the best nights of sleep in the sleep lab.  There was no follow up needed. AHI was 0/h. No oxygen desaturations.  Meanwhile changes in risk factors were present:  Obesity -  BMI 50. 6, snoring has worsened, he sleeps in a seperate bedroom now.  Nocturia- none,  Asthma now more severe on inhaler, no dental or  ENT surgeries,  He reports not feeling refreshed or restored in AM, with symptoms such as dry mouth, morning headaches ( 2-3 times a week !) dull pressure , base of the head  and works its way up, nausea  if it progresses, then followed by phonophobia, photophobia and remain nauseated for an hour.   Naps are taken infrequently I don't nap well and wake up not refreshed .     Family medical /sleep history: Mother  on CPAP with OSA,  father is untreated.  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       Epworth sleepiness score: 8/ 24 points   FSS endorsed at 47/ 63 points.    BMI: 50.5  kg/m   Neck Circumference:  18   FINDINGS:   Sleep Summary:   Total Recording Time (hours, min):    8 hours 25 minutes   Total Sleep Time (hours, min):    7 hours 32 minutes             Percent REM (%):   71%                                     Respiratory Indices:   Calculated pAHI (per AASM guideline): 28.3/h                         REM pAHI:    36.3/h                                             NREM pAHI: 26.8/h                            Positional AHI: Right lateral sleep was seen for the majority of the night at 243 minutes and associated with an AHI of 11.9/h.  This was followed by supine sleep 452 minutes.  The AHI was 54.7/h.  Mild to moderate snoring accompanied with his AHI's.     Snoring:      41 dB mean volume  Oxygen Saturation Statistics:   Oxygen Saturation (%) Mean: 96%               O2 Saturation Range (%):    Between 87 and 100%                                   O2 Saturation (minutes) <89%: 0 minutes        Pulse Rate Statistics:   Pulse Mean (bpm): 67 bpm               Pulse Range:    From 46 through 113 bpm             IMPRESSION:  This HST confirms the presence of moderate to severe sleep apnea of all-obstructive origin.  No central events were noted.  REM sleep accentuated the apnea index but this apnea was not REM sleep dependent.  And much bigger influence at the sleep position and supine sleep almost quadrupled the AHI in comparison to nonsupine sleep   RECOMMENDATION: Sleep apnea and snoring can cause or contribute to MR Greens non refreshing sleep, his morning headaches and  lethargy.  The main risk factor for Alex [name]'s sleep apnea is his obesity.  His BMI was 50.7 up from 46.5 in 2017.  Neck circumference 18 inches, Mallampati was grade 2. It would be important for [name] to not sleep on his back #1  #2 this degree of apnea does need positive airway pressure therapy.The seal of his CPAP mask can be influenced by facial hair.   PLan:I will order an autotitration CPAP device by [employer] with the following settings, 6-16 cm water pressure 1 cm EPR and mask to be fitted.  The patients apnea degree would allow pre authorization for FDA approved  weight loss medication.     GENERAL RECOMMENDATIONS:   Any patient should be  cautioned not to drive, work at heights, or operate dangerous or heavy equipment when tired or sleepy.   Review of good sleep hygiene measures is accessible to any sleep clinic patient and can be reiterated through online material- I we recommend the Guide to better Sleep   by the NIH.   Weight loss and Core Strength improvement is highly recommended for individuals with low muscle tone and/ or a BMI over 30.  Any CPAP patient should be reminded to be fully compliant with PAP therapy , (defined as using PAP therapy for more than 4 hours each night ) with the goal to improve sleep related symptoms and decrease long term cardiovascular risks. Any PAP therapy patient should be reminded, that it may take up to 3 months to get fully used to using PAP and it may take 1-2 weeks for an established CPAP user to acclimatize to changes in pressure or mask. The earlier full compliance is achieved, the better long term compliance tends to be.   Please note that untreated obstructive sleep apnea may carry additional perioperative morbidity. Patients with significant obstructive sleep apnea should receive perioperative PAP therapy and the surgical team should be informed of the diagnosis and degree of sleep disordered breathing.  Sleep fragmentation in the presence of normal proportional sleep stages is a nonspecific findings and per se does not signify an intrinsic sleep disorder or a cause for the patient's sleep-related symptoms.  Causes include (but are not limited to) the unfamiliarity of sleeping while recorded by HST device or sleeping in  a sleep lab for a full Polysomnography sleep study, but also circadian rhythm disturbances, medication side effects or an underlying mood disorder or medical problem.   The referring physician will be notified of the test results.       INTERPRETING PHYSICIAN:   Dedra Gores, MD  [facility] Sleep Board certified by The [employer] of Sleep Medicine and Diplomate of the [employer] of Sleep Medicine. Board certified In Neurology through the ABPN, Fellow of the [employer] of Neurology.

## 2024-06-16 ENCOUNTER — Other Ambulatory Visit (HOSPITAL_COMMUNITY): Payer: Self-pay

## 2024-06-16 ENCOUNTER — Other Ambulatory Visit: Payer: Self-pay

## 2024-06-19 ENCOUNTER — Ambulatory Visit: Payer: Self-pay | Admitting: Neurology

## 2024-06-19 DIAGNOSIS — R519 Headache, unspecified: Secondary | ICD-10-CM

## 2024-06-19 DIAGNOSIS — Z9189 Other specified personal risk factors, not elsewhere classified: Secondary | ICD-10-CM

## 2024-06-19 DIAGNOSIS — E66813 Obesity, class 3: Secondary | ICD-10-CM

## 2024-06-19 DIAGNOSIS — G4733 Obstructive sleep apnea (adult) (pediatric): Secondary | ICD-10-CM

## 2024-06-19 NOTE — Procedures (Signed)
 [facility]  Alex Alex 43 year old nonbinary [birth year]   HOME SLEEP TEST REPORT ( by Watch PAT)   STUDY DATE:  06-13-2024   ORDERING CLINICIAN: Dedra Gores, [name]  REFERRING CLINICIAN:  Louann Penton, [name]   CLINICAL INFORMATION/HISTORY: Alex Alex is a 43 y.o. adult patient who is seen upon referral from Dr Alex on 05/23/2024 . Chief concern according to patient :  Last seen in 2017, had a  sleep study here and was not diagnosed with OSA . He reports he had one of the best nights of sleep in the sleep lab.  There was no follow up needed. AHI was 0/h. No oxygen desaturations.  Meanwhile changes in risk factors were present:  Obesity -  BMI 50. 6, snoring has worsened, he sleeps in a seperate bedroom now.  Nocturia- none,  Asthma now more severe on inhaler, no dental or  ENT surgeries,  He reports not feeling refreshed or restored in AM, with symptoms such as dry mouth, morning headaches ( 2-3 times a week !) dull pressure , base of the head  and works its way up, nausea  if it progresses, then followed by phonophobia, photophobia and remain nauseated for an hour.   Naps are taken infrequently I don't nap well and wake up not refreshed .     Family medical /sleep history: Mother  on CPAP with OSA,  father is untreated.  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       Epworth sleepiness score: 8/ 24 points   FSS endorsed at 47/ 63 points.    BMI: 50.5  kg/m   Neck Circumference:  18   FINDINGS:   Sleep Summary:   Total Recording Time (hours, min):    8 hours 25 minutes   Total Sleep Time (hours, min):    7 hours 32 minutes             Percent REM (%):   71%                                     Respiratory Indices:   Calculated pAHI (per AASM guideline): 28.3/h                         REM pAHI:    36.3/h                                             NREM pAHI: 26.8/h                            Positional AHI: Right lateral sleep was seen for the majority of the night at 243 minutes and associated with an AHI of 11.9/h.  This was followed by supine sleep 452 minutes.  The AHI was 54.7/h.  Mild to moderate snoring accompanied with his AHI's.     Snoring:      41 dB mean volume  Oxygen Saturation Statistics:   Oxygen Saturation (%) Mean: 96%               O2 Saturation Range (%):    Between 87 and 100%                                   O2 Saturation (minutes) <89%: 0 minutes        Pulse Rate Statistics:   Pulse Mean (bpm): 67 bpm               Pulse Range:    From 46 through 113 bpm             IMPRESSION:  This HST confirms the presence of moderate to severe sleep apnea of all-obstructive origin.  No central events were noted.  REM sleep accentuated the apnea index but this apnea was not REM sleep dependent.  And much bigger influence at the sleep position and supine sleep almost quadrupled the AHI in comparison to nonsupine sleep   RECOMMENDATION: Sleep apnea and snoring can cause or contribute to Alex Alex non refreshing sleep, his morning headaches and  lethargy.  The main risk factor for Alex Alex's sleep apnea is his obesity.  His BMI was 50.7 up from 46.5 in 2017.  Neck circumference 18 inches, Mallampati was grade 2. It would be important for Alex Alex to not sleep on his back #1  #2 this degree of apnea does need positive airway pressure therapy.The seal of his CPAP mask can be influenced by facial hair.   PLan:I will order an autotitration CPAP device by [employer] with the following settings, 6-16 cm water pressure 1 cm EPR and mask to be fitted.  The patients apnea degree would allow pre authorization for FDA approved  weight loss medication.     GENERAL RECOMMENDATIONS:   Any patient should be cautioned not  to drive, work at heights, or operate dangerous or heavy equipment when tired or sleepy.   Review of good sleep hygiene measures is accessible to any sleep clinic patient and can be reiterated through online material- I we recommend the Guide to better Sleep   by the NIH.   Weight loss and Core Strength improvement is highly recommended for individuals with low muscle tone and/ or a BMI over 30.  Any CPAP patient should be reminded to be fully compliant with PAP therapy , (defined as using PAP therapy for more than 4 hours each night ) with the goal to improve sleep related symptoms and decrease long term cardiovascular risks. Any PAP therapy patient should be reminded, that it may take up to 3 months to get fully used to using PAP and it may take 1-2 weeks for an established CPAP user to acclimatize to changes in pressure or mask. The earlier full compliance is achieved, the better long term compliance tends to be.   Please note that untreated obstructive sleep apnea may carry additional perioperative morbidity. Patients with significant obstructive sleep apnea should receive perioperative PAP therapy and the surgical team should be informed of the diagnosis and degree of sleep disordered breathing.  Sleep fragmentation in the presence of normal proportional sleep stages is a nonspecific findings and per se does not signify an intrinsic sleep disorder or a cause for the patient's sleep-related symptoms.  Causes include (but are not limited to) the unfamiliarity of sleeping while recorded by HST device or sleeping in  a sleep lab for a full Polysomnography sleep study, but also circadian rhythm disturbances, medication side effects or an underlying mood disorder or medical problem.   The referring physician will be notified of the test results.       INTERPRETING PHYSICIAN:   Alex Alex  [facility] Sleep Board certified by The [employer] of Sleep  Medicine and Diplomate of the [employer] of Sleep Medicine. Board certified In Neurology through the ABPN, Fellow of the [employer] of Neurology.

## 2024-06-20 ENCOUNTER — Telehealth (HOSPITAL_COMMUNITY): Payer: Self-pay

## 2024-06-20 ENCOUNTER — Other Ambulatory Visit: Payer: Self-pay

## 2024-06-20 ENCOUNTER — Encounter (INDEPENDENT_AMBULATORY_CARE_PROVIDER_SITE_OTHER): Payer: Self-pay | Admitting: Family Medicine

## 2024-06-20 ENCOUNTER — Other Ambulatory Visit (HOSPITAL_BASED_OUTPATIENT_CLINIC_OR_DEPARTMENT_OTHER): Payer: Self-pay

## 2024-06-20 ENCOUNTER — Ambulatory Visit (INDEPENDENT_AMBULATORY_CARE_PROVIDER_SITE_OTHER): Admitting: Family Medicine

## 2024-06-20 ENCOUNTER — Other Ambulatory Visit (HOSPITAL_COMMUNITY): Payer: Self-pay

## 2024-06-20 VITALS — BP 105/69 | HR 72 | Temp 97.8°F | Ht 70.0 in | Wt 347.0 lb

## 2024-06-20 DIAGNOSIS — E559 Vitamin D deficiency, unspecified: Secondary | ICD-10-CM

## 2024-06-20 DIAGNOSIS — G4733 Obstructive sleep apnea (adult) (pediatric): Secondary | ICD-10-CM | POA: Diagnosis not present

## 2024-06-20 DIAGNOSIS — Z6841 Body Mass Index (BMI) 40.0 and over, adult: Secondary | ICD-10-CM | POA: Diagnosis not present

## 2024-06-20 DIAGNOSIS — F3289 Other specified depressive episodes: Secondary | ICD-10-CM

## 2024-06-20 DIAGNOSIS — E669 Obesity, unspecified: Secondary | ICD-10-CM

## 2024-06-20 DIAGNOSIS — F5089 Other specified eating disorder: Secondary | ICD-10-CM

## 2024-06-20 MED ORDER — TIRZEPATIDE-WEIGHT MANAGEMENT 2.5 MG/0.5ML ~~LOC~~ SOAJ
2.5000 mg | SUBCUTANEOUS | 0 refills | Status: DC
  Filled 2024-06-20 (×2): qty 2, 28d supply, fill #0

## 2024-06-20 MED ORDER — VITAMIN D (ERGOCALCIFEROL) 1.25 MG (50000 UNIT) PO CAPS
50000.0000 [IU] | ORAL_CAPSULE | ORAL | 0 refills | Status: DC
  Filled 2024-06-20 (×2): qty 4, 28d supply, fill #0

## 2024-06-20 MED ORDER — NALTREXONE HCL 50 MG PO TABS
100.0000 mg | ORAL_TABLET | Freq: Every day | ORAL | 0 refills | Status: DC
  Filled 2024-06-20 (×2): qty 60, 30d supply, fill #0

## 2024-06-20 NOTE — Telephone Encounter (Signed)
 Sarah,   Just checking that the PA was for OSA and not for obesity. Is his insurance saying the will not cover a GP-1 for any reason whatsoever? Even if he was diabetic?  Thank you very much for your help  Dr Verdon

## 2024-06-20 NOTE — Telephone Encounter (Signed)
 Pharmacy Patient Advocate Encounter   Received notification from Pt Calls Messages that prior authorization for Zepbound  2.5 mg/0.5 ml pen is required/requested.   Insurance verification completed.   The patient is insured through [facility] .   Per test claim: Excluded drug, esception process not available. At this time, [employer] is not covering any GLP-1 injectables. They will cover Qsymia, Conrave or Phentermine with a prior auth as long as they meet the criteria for those drugs.

## 2024-06-20 NOTE — Progress Notes (Signed)
 Office: [phone]  /  Fax: [phone]  WEIGHT SUMMARY AND BIOMETRICS  Anthropometric Measurements Height: 5' 10 (1.778 m) Weight: (!) 347 lb (157.4 kg) BMI (Calculated): 49.79 Weight at Last Visit: 348 lb Weight Lost Since Last Visit: 1 lb Weight Gained Since Last Visit: 0 Starting Weight: 311 lb Total Weight Loss (lbs): 0 lb (0 kg) Peak Weight: 350 lb   Body Composition  Body Fat %: 41.5 % Fat Mass (lbs): 144.2 lbs Muscle Mass (lbs): 193.4 lbs Total Body Water (lbs): 141.6 lbs Visceral Fat Rating : 28   Other Clinical Data Fasting: no Labs: no Today's Visit #: 45 Starting Date: 06/25/21 Comments: 1500-2000+100    Chief Complaint: OBESITY    History of Present Illness Alex [name] is a 43 year old with obesity and sleep apnea who presents for obesity treatment and assessment of progress.  They are adhering to a prescribed diet plan of 2000 calories and 100 or more grams of protein. Their physical activity includes one to two hours of yard work and strengthening exercises twice a week. Over the past month, they have lost one pound.  They report being told they have moderate to severe obstructive sleep apnea based on a recent home sleep study, during which they experienced their best night's sleep. They do not use a CPAP machine. Their sleep apnea is influenced by sleep position, with supine positioning quadrupling apnea events.  They are taking naltrexone , increased to twice a day. Initially, they experienced a week of 'sour stomach' but now find it easier to avoid eating between meals, attributing this change to the medication. They also take vitamin D  supplements.  They engage in outdoor gardening activities, such as pulling weeds and moving mulch, one to two days a week for about three hours. They want to return to weightlifting now that their child has returned to school, providing them with more free time during the day.      PHYSICAL  EXAM:  Blood pressure 105/69, pulse 72, temperature 97.8 F (36.6 C), height 5' 10 (1.778 m), weight (!) 347 lb (157.4 kg), SpO2 98%. Body mass index is 49.79 kg/m.  DIAGNOSTIC DATA REVIEWED:  BMET    Component Value Date/Time   NA 139 04/13/2024 1025   K 4.4 04/13/2024 1025   CL 101 04/13/2024 1025   CO2 21 04/13/2024 1025   GLUCOSE 90 04/13/2024 1025   GLUCOSE 94 06/01/2023 1347   BUN 15 04/13/2024 1025   CREATININE 0.89 04/13/2024 1025   CREATININE 0.73 06/01/2023 1347   CALCIUM 9.1 04/13/2024 1025   GFRNONAA >60 06/05/2021 1542   GFRAA 119 03/19/2020 0913   Lab Results  Component Value Date   HGBA1C 5.2 04/13/2024   HGBA1C 5.0 06/25/2021   Lab Results  Component Value Date   INSULIN  18.8 04/13/2024   INSULIN  6.5 06/25/2021   Lab Results  Component Value Date   TSH 2.00 06/01/2023   CBC    Component Value Date/Time   WBC 5.7 04/13/2024 1025   WBC 7.0 06/05/2021 1542   RBC 5.19 04/13/2024 1025   RBC 4.91 06/05/2021 1542   HGB 14.2 04/13/2024 1025   HCT 45.1 04/13/2024 1025   PLT 239 04/13/2024 1025   MCV 87 04/13/2024 1025   MCH 27.4 04/13/2024 1025   MCH 27.7 06/05/2021 1542   MCHC 31.5 04/13/2024 1025   MCHC 32.1 06/05/2021 1542   RDW 13.5 04/13/2024 1025   Iron Studies    Component Value Date/Time  IRON 61 11/26/2021 0832   TIBC 373 11/26/2021 0832   FERRITIN 29 (L) 11/26/2021 0832   IRONPCTSAT 16 11/26/2021 0832   Lipid Panel     Component Value Date/Time   CHOL 188 04/13/2024 1025   TRIG 63 04/13/2024 1025   HDL 49 04/13/2024 1025   CHOLHDL 3.7 06/01/2023 1347   LDLCALC 127 (H) 04/13/2024 1025   LDLCALC 121 (H) 06/01/2023 1347   Hepatic Function Panel     Component Value Date/Time   PROT 6.7 04/13/2024 1025   ALBUMIN 4.3 04/13/2024 1025   AST 22 04/13/2024 1025   ALT 29 04/13/2024 1025   ALKPHOS 88 04/13/2024 1025   BILITOT 0.4 04/13/2024 1025      Component Value Date/Time   TSH 2.00 06/01/2023 1347   Nutritional Lab  Results  Component Value Date   VD25OH 48.0 04/13/2024   VD25OH 36.9 10/12/2023   VD25OH 48 06/01/2023     Assessment and Plan Assessment & Plan Obesity Following a journaling plan of 2000 calories and 100 or more grams of protein. Engaging in physical activity, including one to two hours of yard work and strengthening exercises twice a week. Lost one pound in the last month. Eligible for Zepbound , which will be sent to [facility] for additional pharmacy support with insurance. Discussed potential side effects of Zepbound , including queasiness and constipation, and strategies to mitigate them. Recommended timing of administration to align with work schedule to minimize side effects during work shifts. - Send Zepbound  prescription to [employer] for insurance support - Advise on timing of Zepbound  administration to minimize side effects during work shifts  Obstructive sleep apnea, moderate to severe Diagnosed with moderate to severe obstructive sleep apnea based on a recent home sleep study. Apnea is not central and is significantly influenced by sleep position, with symptoms worsening when lying on their back. Does not currently have a CPAP machine but is agreeable to obtaining one to improve sleep quality. Discussed the influence of facial hair on CPAP fit and potential solutions. - Contact sleep center to obtain CPAP machine - Send sleep study records to insurance for CPAP approval - Advise on sleep position to reduce apnea severity  Emotional eating behaviors Increasing the dose of naltrexone  to twice a day has made it easier to avoid eating between meals, which is often related to boredom rather than hunger. Experienced a sour stomach initially but has adjusted to the medication. Emotional eating is more challenging during long, uneventful work shifts. - Refill naltrexone  prescription  Vitamin D  deficiency Continuing treatment for vitamin D  deficiency. - Refill vitamin D   prescription      Alex [name] was informed of the importance of frequent follow up visits to maximize Alex [name]'s success with intensive lifestyle modifications for Alex [name]'s multiple health conditions.    Louann Penton, MD

## 2024-06-21 ENCOUNTER — Other Ambulatory Visit (HOSPITAL_COMMUNITY): Payer: Self-pay

## 2024-06-21 DIAGNOSIS — F411 Generalized anxiety disorder: Secondary | ICD-10-CM | POA: Diagnosis not present

## 2024-06-21 DIAGNOSIS — F900 Attention-deficit hyperactivity disorder, predominantly inattentive type: Secondary | ICD-10-CM | POA: Diagnosis not present

## 2024-06-21 DIAGNOSIS — F331 Major depressive disorder, recurrent, moderate: Secondary | ICD-10-CM | POA: Diagnosis not present

## 2024-06-21 NOTE — Telephone Encounter (Signed)
 I called pt. I advised pt that Dr. Chalice reviewed their sleep study results and found that pt moderate to severe OSA. Dr. Chalice recommends that pt starts auto CPAP. I reviewed PAP compliance expectations with the pt. Pt is agreeable to starting a CPAP. I advised pt that an order will be sent to a DME, [facility], and [facility] will call the pt within about one week after they file with the pt's insurance. [facility] will show the pt how to use the machine, fit for masks, and troubleshoot the CPAP if needed. A follow up appt was made for insurance purposes with Dr. Chalice on 08/22/2024. Pt verbalized understanding to arrive 15 minutes early and bring their CPAP. Pt verbalized understanding of results. Pt had no questions at this time but was encouraged to call back if questions arise. I have sent the order to [facility] and have received confirmation that they have received the order.

## 2024-06-21 NOTE — Telephone Encounter (Signed)
-----   Message from Maiden Dohmeier sent at 06/19/2024  5:48 PM EDT ----- This HST confirms the presence of moderate to severe sleep apnea of all-obstructive origin. No central events were noted. REM sleep accentuated the apnea index but this apnea was not REM sleep  dependent. And much bigger influence at the sleep position and supine sleep almost quadrupled the AHI in comparison to nonsupine sleep.Main  sleep apnea risk factor is obesity.  The BMI was 50.7 up  from 46.5 in 2017.  Neck circumference 18 inches, Mallampati was grade 2. #1 It would be important for Alex [name] to not sleep on his back. #2 this degree of apnea does need positive airway pressure therapy.The seal of his CPAP mask can be influenced by facial hair. #3  the patient  is established with weight and wellness, his Dx of moderate - severe apnea can help to obtain medications for weight loss.    ----- Message ----- From: Chalice Saunas, MD Sent: 06/19/2024   5:43 PM EDT To: Saunas Chalice, MD

## 2024-06-22 ENCOUNTER — Other Ambulatory Visit (HOSPITAL_COMMUNITY): Payer: Self-pay

## 2024-06-27 ENCOUNTER — Other Ambulatory Visit (HOSPITAL_COMMUNITY): Payer: Self-pay

## 2024-06-29 ENCOUNTER — Other Ambulatory Visit

## 2024-06-29 DIAGNOSIS — Z006 Encounter for examination for normal comparison and control in clinical research program: Secondary | ICD-10-CM

## 2024-07-04 ENCOUNTER — Encounter: Payer: Self-pay | Admitting: Internal Medicine

## 2024-07-07 LAB — GENECONNECT MOLECULAR SCREEN: Genetic Analysis Overall Interpretation: NEGATIVE

## 2024-07-18 ENCOUNTER — Encounter (INDEPENDENT_AMBULATORY_CARE_PROVIDER_SITE_OTHER): Payer: Self-pay | Admitting: Family Medicine

## 2024-07-18 ENCOUNTER — Other Ambulatory Visit (HOSPITAL_COMMUNITY): Payer: Self-pay

## 2024-07-18 ENCOUNTER — Ambulatory Visit (INDEPENDENT_AMBULATORY_CARE_PROVIDER_SITE_OTHER): Admitting: Family Medicine

## 2024-07-18 VITALS — BP 130/79 | HR 91 | Temp 98.0°F | Ht 70.0 in | Wt 347.0 lb

## 2024-07-18 DIAGNOSIS — G4733 Obstructive sleep apnea (adult) (pediatric): Secondary | ICD-10-CM

## 2024-07-18 DIAGNOSIS — Z6841 Body Mass Index (BMI) 40.0 and over, adult: Secondary | ICD-10-CM

## 2024-07-18 DIAGNOSIS — E66813 Obesity, class 3: Secondary | ICD-10-CM

## 2024-07-18 DIAGNOSIS — F32A Depression, unspecified: Secondary | ICD-10-CM | POA: Diagnosis not present

## 2024-07-18 DIAGNOSIS — E559 Vitamin D deficiency, unspecified: Secondary | ICD-10-CM | POA: Diagnosis not present

## 2024-07-18 DIAGNOSIS — F3289 Other specified depressive episodes: Secondary | ICD-10-CM

## 2024-07-18 MED ORDER — VITAMIN D (ERGOCALCIFEROL) 1.25 MG (50000 UNIT) PO CAPS
50000.0000 [IU] | ORAL_CAPSULE | ORAL | 0 refills | Status: DC
  Filled 2024-07-18: qty 4, 28d supply, fill #0

## 2024-07-18 NOTE — Progress Notes (Signed)
 Office: [phone]  /  Fax: [phone]  WEIGHT SUMMARY AND BIOMETRICS  Anthropometric Measurements Height: 5' 10 (1.778 m) Weight: (!) 347 lb (157.4 kg) BMI (Calculated): 49.79 Weight at Last Visit: 347 lb Weight Lost Since Last Visit: 0 Weight Gained Since Last Visit: 0 Starting Weight: 311 lb Total Weight Loss (lbs): 0 lb (0 kg) Peak Weight: 350 lb   Body Composition  Body Fat %: 42.3 % Fat Mass (lbs): 147.2 lbs Muscle Mass (lbs): 190.8 lbs Total Body Water (lbs): 145 lbs Visceral Fat Rating : 29   Other Clinical Data Fasting: no Labs: no Today's Visit #: 46 Starting Date: 06/25/21 Comments: [id number]    Chief Complaint: OBESITY    History of Present Illness Alex [name] is a 43 year old with obesity and obstructive sleep apnea who presents for obesity treatment and progress assessment.  They have been following a 1500 to 2000 calorie eating plan with over 100 grams of protein but have struggled to concentrate on this regimen over the past month. Despite this, they have maintained their weight but have not engaged in any exercise.  They are currently on prescription vitamin D  for vitamin D  deficiency, taking 50,000 international units weekly, and have requested a refill.  They have a history of obstructive sleep apnea, and their insurance has not approved Zepbound  for treatment. They have not yet received a CPAP machine due to delays and a computer glitch that deleted their order.  They have been in a 'real bad head space' for the past month, feeling defeated due to the insurance and CPAP issues. They have experienced a loss of appetite, which is unusual for them during depressive episodes, and have been feeling nauseous around 9 AM after taking their medication.  They are currently taking Vyvanse  and naltrexone , but are unsure if naltrexone  is helping as they have been experiencing nausea. They take the medications separately, one in the  morning and one midday, with food.  They have a therapy appointment scheduled for tomorrow to discuss their mental health challenges over the past month. They typically see their therapist once a month but acknowledge they might need more frequent sessions when feeling down.      PHYSICAL EXAM:  Blood pressure 130/79, pulse 91, temperature 98 F (36.7 C), height 5' 10 (1.778 m), weight (!) 347 lb (157.4 kg), SpO2 98%. Body mass index is 49.79 kg/m.  DIAGNOSTIC DATA REVIEWED:  BMET    Component Value Date/Time   NA 139 04/13/2024 1025   K 4.4 04/13/2024 1025   CL 101 04/13/2024 1025   CO2 21 04/13/2024 1025   GLUCOSE 90 04/13/2024 1025   GLUCOSE 94 06/01/2023 1347   BUN 15 04/13/2024 1025   CREATININE 0.89 04/13/2024 1025   CREATININE 0.73 06/01/2023 1347   CALCIUM 9.1 04/13/2024 1025   GFRNONAA >60 06/05/2021 1542   GFRAA 119 03/19/2020 0913   Lab Results  Component Value Date   HGBA1C 5.2 04/13/2024   HGBA1C 5.0 06/25/2021   Lab Results  Component Value Date   INSULIN  18.8 04/13/2024   INSULIN  6.5 06/25/2021   Lab Results  Component Value Date   TSH 2.00 06/01/2023   CBC    Component Value Date/Time   WBC 5.7 04/13/2024 1025   WBC 7.0 06/05/2021 1542   RBC 5.19 04/13/2024 1025   RBC 4.91 06/05/2021 1542   HGB 14.2 04/13/2024 1025   HCT 45.1 04/13/2024 1025   PLT 239 04/13/2024 1025   MCV  87 04/13/2024 1025   MCH 27.4 04/13/2024 1025   MCH 27.7 06/05/2021 1542   MCHC 31.5 04/13/2024 1025   MCHC 32.1 06/05/2021 1542   RDW 13.5 04/13/2024 1025   Iron Studies    Component Value Date/Time   IRON 61 11/26/2021 0832   TIBC 373 11/26/2021 0832   FERRITIN 29 (L) 11/26/2021 0832   IRONPCTSAT 16 11/26/2021 0832   Lipid Panel     Component Value Date/Time   CHOL 188 04/13/2024 1025   TRIG 63 04/13/2024 1025   HDL 49 04/13/2024 1025   CHOLHDL 3.7 06/01/2023 1347   LDLCALC 127 (H) 04/13/2024 1025   LDLCALC 121 (H) 06/01/2023 1347   Hepatic Function  Panel     Component Value Date/Time   PROT 6.7 04/13/2024 1025   ALBUMIN 4.3 04/13/2024 1025   AST 22 04/13/2024 1025   ALT 29 04/13/2024 1025   ALKPHOS 88 04/13/2024 1025   BILITOT 0.4 04/13/2024 1025      Component Value Date/Time   TSH 2.00 06/01/2023 1347   Nutritional Lab Results  Component Value Date   VD25OH 48.0 04/13/2024   VD25OH 36.9 10/12/2023   VD25OH 48 06/01/2023     Assessment and Plan Assessment & Plan Class 3 Obesity Experiencing challenges in managing obesity due to insurance issues with obtaining Zepbound  for obstructive sleep apnea. Maintained weight but unable to focus on calorie and protein intake plan in the last month. Not currently exercising. Discussed potential of weight loss surgery, specifically the gastric sleeve, addressing concerns about surgical complications. Expressed fear of surgery due to personal experiences with friends having complications, but was informed that statistically, complications are not the norm. Emphasized importance of forming new habits pre- and post-surgery to avoid complications and regain weight. - Continue current calorie and protein intake plan. 1500-2000 calories with 120+ gm protein - Discuss weight loss surgery options, specifically gastric sleeve, with [facility] surgeons. - Encourage formation of new dietary habits to prepare for potential surgery.  Obstructive Sleep Apnea Meets FDA requirements for Zepbound  to treat obstructive sleep apnea, but insurance has denied coverage. Has not yet received a CPAP machine due to administrative delays and a Ship broker. Feels defeated by the process and lack of progress in obtaining treatment. - Follow up with insurance and CPAP provider to resolve administrative issues. - Continue diet, exercise and weight loss as discussed today as an important part of the treatment plan   Vitamin D  Deficiency On prescription vitamin D , 50,000 IU weekly, and requests a refill. -  Refill prescription for vitamin D , 50,000 IU weekly.  Depression Reports being in a bad headspace for the past month, feeling defeated and lacking energy. Lost appetite, which is unusual for their depression. Scheduled to discuss these issues with therapist. Naltrexone  may not be beneficial as they experience nausea without noticeable benefits. No SI. - Taper off naltrexone  over two weeks, then discontinue. - Continue therapy sessions, with consideration for increased frequency if needed. - Encourage intake of protein and vegetables as part of dietary management.  Follow-Up Has appointments scheduled for October and November and is advised to schedule a December appointment. - Schedule follow-up appointment for December. - Send MyChart message if any issues arise.     Alex [name] was informed of the importance of frequent follow up visits to maximize Alex [name]'s success with intensive lifestyle modifications for Alex [name]'s obesity and obesity related health conditions as recommended by USPSTF and CMS guidelines   Louann Penton, MD

## 2024-07-19 DIAGNOSIS — F900 Attention-deficit hyperactivity disorder, predominantly inattentive type: Secondary | ICD-10-CM | POA: Diagnosis not present

## 2024-07-19 DIAGNOSIS — F411 Generalized anxiety disorder: Secondary | ICD-10-CM | POA: Diagnosis not present

## 2024-07-19 DIAGNOSIS — F331 Major depressive disorder, recurrent, moderate: Secondary | ICD-10-CM | POA: Diagnosis not present

## 2024-07-20 DIAGNOSIS — G4733 Obstructive sleep apnea (adult) (pediatric): Secondary | ICD-10-CM | POA: Diagnosis not present

## 2024-07-24 ENCOUNTER — Other Ambulatory Visit: Payer: Self-pay

## 2024-07-28 ENCOUNTER — Other Ambulatory Visit (HOSPITAL_COMMUNITY): Payer: Self-pay

## 2024-08-15 ENCOUNTER — Encounter (INDEPENDENT_AMBULATORY_CARE_PROVIDER_SITE_OTHER): Payer: Self-pay | Admitting: Family Medicine

## 2024-08-15 ENCOUNTER — Ambulatory Visit (INDEPENDENT_AMBULATORY_CARE_PROVIDER_SITE_OTHER): Admitting: Family Medicine

## 2024-08-15 ENCOUNTER — Other Ambulatory Visit (HOSPITAL_COMMUNITY): Payer: Self-pay

## 2024-08-15 ENCOUNTER — Other Ambulatory Visit: Payer: Self-pay

## 2024-08-15 VITALS — BP 133/81 | HR 65 | Temp 98.1°F | Ht 70.0 in | Wt 350.0 lb

## 2024-08-15 DIAGNOSIS — G4733 Obstructive sleep apnea (adult) (pediatric): Secondary | ICD-10-CM

## 2024-08-15 DIAGNOSIS — F5089 Other specified eating disorder: Secondary | ICD-10-CM

## 2024-08-15 DIAGNOSIS — Z6841 Body Mass Index (BMI) 40.0 and over, adult: Secondary | ICD-10-CM

## 2024-08-15 DIAGNOSIS — J301 Allergic rhinitis due to pollen: Secondary | ICD-10-CM

## 2024-08-15 DIAGNOSIS — J302 Other seasonal allergic rhinitis: Secondary | ICD-10-CM | POA: Diagnosis not present

## 2024-08-15 DIAGNOSIS — F331 Major depressive disorder, recurrent, moderate: Secondary | ICD-10-CM

## 2024-08-15 DIAGNOSIS — E559 Vitamin D deficiency, unspecified: Secondary | ICD-10-CM

## 2024-08-15 DIAGNOSIS — E66813 Obesity, class 3: Secondary | ICD-10-CM | POA: Diagnosis not present

## 2024-08-15 DIAGNOSIS — E669 Obesity, unspecified: Secondary | ICD-10-CM

## 2024-08-15 MED ORDER — BUPROPION HCL ER (SR) 150 MG PO TB12
150.0000 mg | ORAL_TABLET | Freq: Every day | ORAL | 0 refills | Status: DC
  Filled 2024-08-15: qty 30, 30d supply, fill #0

## 2024-08-15 MED ORDER — VITAMIN D (ERGOCALCIFEROL) 1.25 MG (50000 UNIT) PO CAPS
50000.0000 [IU] | ORAL_CAPSULE | ORAL | 0 refills | Status: DC
  Filled 2024-08-15: qty 4, 28d supply, fill #0

## 2024-08-15 NOTE — Progress Notes (Signed)
 Office: [phone]  /  Fax: [phone]  WEIGHT SUMMARY AND BIOMETRICS  Anthropometric Measurements Height: 5' 10 (1.778 m) Weight: (!) 350 lb (158.8 kg) BMI (Calculated): 50.22 Weight at Last Visit: 347 lb Weight Lost Since Last Visit: 0 Weight Gained Since Last Visit: 3 lb Starting Weight: 311 lb Total Weight Loss (lbs): 0 lb (0 kg) Peak Weight: 350 lb   Body Composition  Body Fat %: 42.5 % Fat Mass (lbs): 148.8 lbs Muscle Mass (lbs): 191.4 lbs Total Body Water (lbs): 145 lbs Visceral Fat Rating : 29   Other Clinical Data Fasting: no Labs: no Today's Visit #: 19 Starting Date: 06/25/21    Chief Complaint: OBESITY   Discussed the use of AI scribe software for clinical note transcription with the patient, who gave verbal consent to proceed.  History of Present Illness Alex [name] Alex [name] is a 43 year old with obesity and obstructive sleep apnea who presents for obesity treatment and progress assessment.  They are following a journaling plan with approximately 2000 calories and at least 100 grams of protein, achieving this about 60% of the time. Efforts are being made to increase the intake of whole fruits and vegetables and ensure adequate protein consumption. Hydration is a focus, although meals are occasionally skipped. Exercise is limited to once a week for 45 to 60 minutes, primarily weight training. Despite these efforts, there has been a weight gain of three pounds in the last month.  In addition to obesity, they are being treated for obstructive sleep apnea. They were prescribed Zepbound , but insurance refused coverage despite meeting the criteria for moderate to severe obstructive sleep apnea with an AHI of fifteen or greater. They are using a CPAP machine reliably.  Their allergies are currently acting up, and they are taking Allegra during the day and Zyrtec at night, along with Nasacort daily. A scratch test in high school showed a significant  allergy to milkweed, a fall allergen. They also have allergies to plants, grass, mold, and cats, but not to dogs or ragweed.  They are currently on Vyvanse  and have previously tried topiramate  for emotional eating. There is no history of seizures. They have stopped taking naltrexone  and Zypram. They are also taking vitamin D .      PHYSICAL EXAM:  Blood pressure 133/81, pulse 65, temperature 98.1 F (36.7 C), height 5' 10 (1.778 m), weight (!) 350 lb (158.8 kg), SpO2 99%. Body mass index is 50.22 kg/m.  DIAGNOSTIC DATA REVIEWED:  BMET    Component Value Date/Time   NA 139 04/13/2024 1025   K 4.4 04/13/2024 1025   CL 101 04/13/2024 1025   CO2 21 04/13/2024 1025   GLUCOSE 90 04/13/2024 1025   GLUCOSE 94 06/01/2023 1347   BUN 15 04/13/2024 1025   CREATININE 0.89 04/13/2024 1025   CREATININE 0.73 06/01/2023 1347   CALCIUM 9.1 04/13/2024 1025   GFRNONAA >60 06/05/2021 1542   GFRAA 119 03/19/2020 0913   Lab Results  Component Value Date   HGBA1C 5.2 04/13/2024   HGBA1C 5.0 06/25/2021   Lab Results  Component Value Date   INSULIN  18.8 04/13/2024   INSULIN  6.5 06/25/2021   Lab Results  Component Value Date   TSH 2.00 06/01/2023   CBC    Component Value Date/Time   WBC 5.7 04/13/2024 1025   WBC 7.0 06/05/2021 1542   RBC 5.19 04/13/2024 1025   RBC 4.91 06/05/2021 1542   HGB 14.2 04/13/2024 1025   HCT 45.1 04/13/2024 1025  PLT 239 04/13/2024 1025   MCV 87 04/13/2024 1025   MCH 27.4 04/13/2024 1025   MCH 27.7 06/05/2021 1542   MCHC 31.5 04/13/2024 1025   MCHC 32.1 06/05/2021 1542   RDW 13.5 04/13/2024 1025   Iron Studies    Component Value Date/Time   IRON 61 11/26/2021 0832   TIBC 373 11/26/2021 0832   FERRITIN 29 (L) 11/26/2021 0832   IRONPCTSAT 16 11/26/2021 0832   Lipid Panel     Component Value Date/Time   CHOL 188 04/13/2024 1025   TRIG 63 04/13/2024 1025   HDL 49 04/13/2024 1025   CHOLHDL 3.7 06/01/2023 1347   LDLCALC 127 (H) 04/13/2024 1025    LDLCALC 121 (H) 06/01/2023 1347   Hepatic Function Panel     Component Value Date/Time   PROT 6.7 04/13/2024 1025   ALBUMIN 4.3 04/13/2024 1025   AST 22 04/13/2024 1025   ALT 29 04/13/2024 1025   ALKPHOS 88 04/13/2024 1025   BILITOT 0.4 04/13/2024 1025      Component Value Date/Time   TSH 2.00 06/01/2023 1347   Nutritional Lab Results  Component Value Date   VD25OH 48.0 04/13/2024   VD25OH 36.9 10/12/2023   VD25OH 48 06/01/2023     Assessment and Plan Assessment & Plan Class 3 Obesity with elevated BMI (BMI 50.0-59.9) Class 3 obesity with a BMI between 50.0-59.9. They have gained three pounds in the last month. Currently following a journaling plan with approximately 2000 calories and at least 100 more grams of protein, meeting this 60% of the time. Exercising one day a week for 45-60 minutes with weights. Working on increasing whole fruits and vegetables and adequate protein intake. Skipping meals at times. Bupropion  is considered to help with emotional eating and weight loss, with potential side effects including dry mouth and rare blood pressure elevation. Bupropion  may also aid in reducing cravings and enhancing weight loss efforts. - Changed to category four eating plan to treat obesity and obstructive sleep apnea. - Prescribed bupropion  150 mg once a day for emotional eating behaviors. - Monitor blood pressure closely while on bupropion . - Provided a grocery list to support the eating plan.  Obstructive Sleep Apnea Obstructive sleep apnea with an AHI of 15 or greater, indicating moderate to severe severity. Insurance did not cover Zepbound . They are using CPAP reliably. Weight loss is a critical component of the treatment plan. - Continue CPAP therapy. - Focus on diet, exercise, and weight loss to help treat sleep apnea.  Emotional Eating Behaviors Emotional eating behaviors contributing to weight management challenges. Bupropion  is considered to help reduce cravings and  support weight loss efforts. Bupropion  may also aid in reducing cravings and enhancing weight loss efforts. - Prescribed bupropion  150 mg once a day to help with emotional eating behaviors. - Monitor response to bupropion  and adjust dose if necessary.  Allergic Rhinitis Allergic rhinitis exacerbated in the fall. Currently taking Allegra during the day and Zyrtec at night, with Nasacort daily. Advised to add Flonase  to the regimen. - Add Flonase  to the current allergy medication regimen.  Vitamin D  Deficiency Vitamin D  deficiency. - Refilled vitamin D  prescription.    Jeison was informed of the importance of frequent follow up visits to maximize [name]'s success with intensive lifestyle modifications for [name]'s obesity and obesity related health conditions as recommended by USPSTF and CMS guidelines   Louann Penton, MD

## 2024-08-16 ENCOUNTER — Other Ambulatory Visit (HOSPITAL_COMMUNITY): Payer: Self-pay

## 2024-08-16 DIAGNOSIS — F411 Generalized anxiety disorder: Secondary | ICD-10-CM | POA: Diagnosis not present

## 2024-08-16 DIAGNOSIS — F331 Major depressive disorder, recurrent, moderate: Secondary | ICD-10-CM | POA: Diagnosis not present

## 2024-08-16 DIAGNOSIS — F50811 Binge eating disorder, moderate: Secondary | ICD-10-CM | POA: Diagnosis not present

## 2024-08-16 MED ORDER — LISDEXAMFETAMINE DIMESYLATE 20 MG PO CAPS
20.0000 mg | ORAL_CAPSULE | Freq: Every morning | ORAL | 0 refills | Status: DC
  Filled 2024-10-04: qty 30, 30d supply, fill #0

## 2024-08-16 MED ORDER — LISDEXAMFETAMINE DIMESYLATE 20 MG PO CAPS
20.0000 mg | ORAL_CAPSULE | Freq: Every morning | ORAL | 0 refills | Status: DC
  Filled 2024-08-23: qty 30, 30d supply, fill #0

## 2024-08-16 MED ORDER — LISDEXAMFETAMINE DIMESYLATE 20 MG PO CAPS: 20.0000 mg | ORAL_CAPSULE | Freq: Every morning | ORAL | 0 refills | Status: DC

## 2024-08-16 MED ORDER — VILAZODONE HCL 20 MG PO TABS
20.0000 mg | ORAL_TABLET | Freq: Every day | ORAL | 0 refills | Status: DC
  Filled 2024-08-16 – 2024-09-28 (×2): qty 90, 90d supply, fill #0

## 2024-08-17 DIAGNOSIS — F900 Attention-deficit hyperactivity disorder, predominantly inattentive type: Secondary | ICD-10-CM | POA: Diagnosis not present

## 2024-08-17 DIAGNOSIS — F411 Generalized anxiety disorder: Secondary | ICD-10-CM | POA: Diagnosis not present

## 2024-08-17 DIAGNOSIS — F331 Major depressive disorder, recurrent, moderate: Secondary | ICD-10-CM | POA: Diagnosis not present

## 2024-08-22 ENCOUNTER — Encounter: Payer: Self-pay | Admitting: Neurology

## 2024-08-22 ENCOUNTER — Ambulatory Visit (INDEPENDENT_AMBULATORY_CARE_PROVIDER_SITE_OTHER): Admitting: Neurology

## 2024-08-22 VITALS — BP 122/78 | HR 88 | Ht 71.0 in | Wt 353.6 lb

## 2024-08-22 DIAGNOSIS — Z6841 Body Mass Index (BMI) 40.0 and over, adult: Secondary | ICD-10-CM

## 2024-08-22 DIAGNOSIS — E66813 Obesity, class 3: Secondary | ICD-10-CM

## 2024-08-22 DIAGNOSIS — G4733 Obstructive sleep apnea (adult) (pediatric): Secondary | ICD-10-CM | POA: Diagnosis not present

## 2024-08-22 NOTE — Progress Notes (Signed)
 Provider:  Dedra Gores, MD  Primary Care Physician:  Allwardt, Mardy HERO, PA-C [address] [place] [address]     Referring Provider: Allwardt, Mardy HERO, Pa-c [address] [place],  [place] [address]          Chief Complaint according to patient   Patient presents with:                HISTORY OF PRESENT ILLNESS:  Alex [name] is a 43 y.o. adult patient who is here for revisit 08/22/2024 for  CPAP compliance: His repeat sleep study from 06-13-2024 confirmed sleep apnea to be present.  I quote the sleep test data below . He has moderate sleep apnea,  mild snoring, he remains overweight and has facial hair. Since starting CPAP , he experienced NP HEADACHES, no midday Fatigue, and no need to nap, mental acuity improvement, and he had started Japanese lessons 2 years ago, and now he has made stronger progress than ever before.   He is highly compliant, very happy and con continue and wants to continue under current settings.     Chief concern according to patient :  I am happy now with my CPAP.       Alex [name] is a 43 y.o. adult patient who is seen upon referral from Dr Verdon on 05/23/2024 . Chief concern according to patient :  Last seen in 2017, had a  sleep study here and was not diagnosed with OSA . He reports he had one of the best nights of sleep in the sleep lab.  There was no follow up needed. AHI was 0/h. No oxygen desaturations.        Sleep relevant medical history: shift work as OR Transport Planner, Obesity -  BMI 50. 6, snoring has worsened, he sleeps in a seperate bedroom now.  Nocturia- none,  Asthma now more severe on inhaler, no dental or  ENT surgeries,     Family medical /sleep history: Mother  on CPAP with OSA,  father is untreated.  Social history:  Patient is working as [employer] , OR, working shifts are sat / sun/ Monday a total of 36 hours-  and lives in a household with wife and daughter , now 64.   Tobacco use; none .  ETOH  use ; some on week ends- ,  Caffeine intake in form of Coffee( 2 mugs ( large ) in AM ) Soda(  with lunch and dinner ) Tea ( /) or energy drinks     Sleep habits are as follows: The patient's dinner time is between 5-6 PM. The patient goes to bed on non work days- he cooks  dinner. Bedtime  at 9 PM and continues to sleep for 7 hours, not waking-up for bathroom breaks, but to change positions  The preferred sleep position is lateral , with the support of 2 pillows  flat bed- Dreams are reportedly infrequent.   The patient wakes up on work days with an alarm. 5  AM is the usual rise time. He reports not feeling refreshed or restored in AM, with symptoms such as dry mouth, morning headaches ( 2-3 times a week !) dull pressure , base of the head  and works its way up, nausea  if it progresses, then followed by phonophobia, photophobia and remain nauseated for an hour.   Naps are taken infrequently I don't nap well and wake up not refreshed .  03-04-2016 Chief complaint according to patient :  I have been snoring and was witnessed to have crescendo breathing, but HST was inconclusive     03-04-2016 : Alex [name] is here today upon request of his primary care physician, he has been fatigued for quite a while. Alex [name] also has noticed that he sometimes when trying to take a nap feels actually worse not refreshed, not restored but craving more. Plus this may actually detract from his nocturnal sleep. He has always been somewhat sleep drunken from nap time even as a child. Dr. Verdia suspected that the patient may have obstructive sleep apnea based on his BMI, his Mallampati, and his symptoms including erectile dysfunction ( from SSRI )  , later azoospermia and still having residual depressive disorder. The patient work is as an Scientist, Forensic, is a education officer, museum . Sleep habits are as follows:  The patient usually goes to bed around 9 PM but may not fall asleep promptly. He likes to read in bed.  His bedroom otherwise is cool, quiet and dark. His alarm is set for 5:40 AM but he hits the snooze button a couple of times before getting up. He has recently noticed that his nights a more fragmented he turns over wakes up a couple of times but he does not look at a clock. He does not feel that his sleep has worsened. When he gets up in the morning he has a slightly dry mouth he does not wake up with headaches or from headaches. He sleeps on one pillow. He prefers to sleep on his side. He does not have back pain or other discomfort no shortness of breath no coughing and no restless legs.   Sleep medical history : ADHD , Obesity,  no HTN, no DM, no Thyroid  dx and family sleep history: father has OSA but never got diagnosed.  Social history:  Married, no children. No tobacco use ever, he grew up  with smoking parents.  ETOH; 1-2 drinks a week, caffeine ; 3-4 cups a day, tea and coffee. No Soda.   Interval history from 03/04/2016.  Alex [name] is here today to follow-up on his recent sleep study performed on March 23. His deep study showed a rare results of 0.0 apneas for the whole night. He spent about 35% of the total sleep time in supine position and still did not produce any apneas he had very rare periodic limb movements little bit of clicking his average heart rate was 67 bpm in sinus rhythm. It a normal sleep EEG. I would like to mentioned that he did not snore. The sleep architecture was excellent, sleep was neither fragmented nor lacking specific sleep phases. The patient entered slow wave sleep as well as REM sleep for a total of 4 REM sleep cycles beginning before midnight and becoming longer and shorter intervals into the morning hours.      Review of Systems: Out of a complete 14 system review, the patient complains of only the following symptoms, and all other reviewed systems are negative.:   SLEEPINESS ?  How likely are you to doze in the following situations: 0 = not likely, 1 = slight  chance, 2 = moderate chance, 3 = high chance  Sitting and Reading? Watching Television? Sitting inactive in a public place (theater or meeting)? Lying down in the afternoon when circumstances permit? Sitting and talking to someone? Sitting quietly after lunch without alcohol? In a car, while stopped for a few minutes in traffic? As  a passenger in a car for an hour without a break?  Total = 0/ 24 FSS at 23/ 63        Social History   Socioeconomic History   Marital status: Married    Spouse name: Not on file   Number of children: Not on file   Years of education: Not on file   Highest education level: Not on file  Occupational History   Not on file  Tobacco Use   Smoking status: Never   Smokeless tobacco: Never  Vaping Use   Vaping status: Never Used  Substance and Sexual Activity   Alcohol use: Yes    Alcohol/week: 2.0 standard drinks of alcohol    Types: 2 Shots of liquor per week    Comment: occasional   Drug use: No   Sexual activity: Yes    Birth control/protection: None  Other Topics Concern   Not on file  Social History Narrative   2025- Drinks about 3 cups of caffeine daily.   Social Drivers of Corporate Investment Banker Strain: Not on file  Food Insecurity: Not on file  Transportation Needs: Not on file  Physical Activity: Not on file  Stress: Not on file  Social Connections: Not on file    Family History  Problem Relation Age of Onset   Anxiety disorder Mother    Depression Mother    Obesity Mother    Sleep apnea Mother    Obesity Father    Hyperlipidemia Father    Hypertension Father    Depression Father    Anxiety disorder Father    Asthma Father    Cancer Maternal Grandfather        skin & lung   Emphysema Maternal Grandfather    Melanoma Maternal Grandfather    Cancer Paternal Grandfather        leukemia   Obesity Maternal Aunt     Past Medical History:  Diagnosis Date   Anxiety    Arterial embolism of left leg (HCC)     Asthma    exercise-induced   Constipation    Depression    Infertility male    Palpitation    SOB (shortness of breath)    Vitamin D  deficiency     Past Surgical History:  Procedure Laterality Date   COLONOSCOPY  2017   bleeding - negative scope   WISDOM TOOTH EXTRACTION       Social history:  Patient is working as [employer] , OR, working shifts are sat / sun/ Monday a total of 36 hours-  and lives in a household with wife and daughter , now 29.         Epworth sleepiness score: 8/ 24 points   FSS endorsed at 47/ 63 points.    BMI: 50.5  kg/m   Neck Circumference:  18   FINDINGS:   Sleep Summary:   Total Recording Time (hours, min):    8 hours 25 minutes   Total Sleep Time (hours, min):    7 hours 32 minutes             Percent REM (%):   71%                                     Respiratory Indices:   Calculated pAHI (per AASM guideline): 28.3/h  REM pAHI:    36.3/h                                             NREM pAHI: 26.8/h                            Positional AHI: Right lateral sleep was seen for the majority of the night at 243 minutes and associated with an AHI of 11.9/h.  This was followed by supine sleep 452 minutes.   The supine  AHI was 54.7/h.   Mild to moderate snoring accompanied with his AHI's.      Snoring:      41 dB mean volume                                          Oxygen Saturation Statistics:   Oxygen Saturation (%) Mean: 96%                O2 Saturation Range (%):    Between 87 and 100%                                   O2 Saturation (minutes) <89%: 0 minutes        Pulse Rate Statistics:   Pulse Mean (bpm): 67 bpm               Pulse Range:    From 46 through 113 bpm             IMPRESSION:  This HST confirms the presence of moderate to severe sleep apnea of all-obstructive origin.  No central events were noted.  REM sleep accentuated the apnea index but this apnea was not REM sleep dependent.  And much bigger  influence at the sleep position and supine sleep almost quadrupled the AHI in comparison to nonsupine sleep   RECOMMENDATION: Sleep apnea and snoring can cause or contribute to Mr Adonna non -refreshing sleep, his morning headaches and  lethargy.  The main risk factor for Mr. Dedominicis's sleep apnea is his obesity.  His BMI was 50.7 up from 46.5 in 2017.  Neck circumference 18 inches, Mallampati was grade 2.  It would be important for [facility] to not sleep on his back #1   #2 this degree of apnea does need positive airway pressure therapy. The seal of his CPAP mask can be influenced by facial hair.  Current Outpatient Medications on File Prior to Visit  Medication Sig Dispense Refill   albuterol  (VENTOLIN  HFA) 108 (90 Base) MCG/ACT inhaler Inhale 1-2 puffs into the lungs every 6 (six) hours as needed for wheezing or shortness of breath. 6.7 g 11   benzonatate  (TESSALON ) 100 MG capsule Take 1 capsule (100 mg total) by mouth 3 (three) times daily as needed for cough. 30 capsule 0   buPROPion  (WELLBUTRIN  SR) 150 MG 12 hr tablet Take 1 tablet (150 mg total) by mouth daily. 30 tablet 0   Fexofenadine HCl (ALLEGRA PO) Take by mouth.     fluticasone  (FLOVENT  HFA) 110 MCG/ACT inhaler Inhale 1 puff into the lungs in the morning and at bedtime. 12 g 11   hydrocortisone -pramoxine (ANALPRAM -HC)  2.5-1 % rectal cream Place 1 Application rectally 3 (three) times daily for 7 days 30 g 3   lisdexamfetamine (VYVANSE ) 20 MG capsule Take 1 capsule (20 mg total) by mouth every morning. 30 capsule 0   lisdexamfetamine (VYVANSE ) 20 MG capsule Take 1 capsule (20 mg total) by mouth every morning. 30 capsule 0   lisdexamfetamine (VYVANSE ) 20 MG capsule Take 1 capsule (20 mg total) by mouth every morning. 30 capsule 0   [START ON 09/21/2024] lisdexamfetamine (VYVANSE ) 20 MG capsule Take 1 capsule (20 mg total) by mouth in the morning. 30 capsule 0   [START ON 10/21/2024] lisdexamfetamine (VYVANSE ) 20 MG capsule Take 1  capsule (20 mg total) by mouth in the morning. 30 capsule 0   lisdexamfetamine (VYVANSE ) 20 MG capsule Take 1 capsule (20 mg total) by mouth in the morning. 30 capsule 0   Melatonin 5 MG CHEW Chew by mouth at bedtime.     Multiple Vitamin (MULTIVITAMIN) tablet Take 1 tablet by mouth daily.     mupirocin  ointment (BACTROBAN ) 2 % Apply 1 Application topically 2 (two) times daily. 22 g 11   ondansetron  (ZOFRAN -ODT) 4 MG disintegrating tablet Take 1 tablet (4 mg total) by mouth every 8 (eight) hours as needed for nausea or vomiting. 20 tablet 0   Pramoxine-HC (HYDROCORTISONE  ACE-PRAMOXINE) 2.5-1 % CREA 1 APPFUL RECTALLY 3 TIMES A DAY; Duration: 7 DAY(S)     SUMAtriptan  (IMITREX ) 50 MG tablet Take 1 tablet (50 mg total) by mouth daily as needed for migraine. May repeat in 1-2 hours if headache persists or recurs. 10 tablet 3   Vilazodone  HCl 20 MG TABS Take 1 tablet (20 mg total) by mouth daily. 90 tablet 0   Vitamin D , Ergocalciferol , (DRISDOL ) 1.25 MG (50000 UNIT) CAPS capsule Take 1 capsule (50,000 Units total) by mouth every 7 (seven) days. 4 capsule 0   No current facility-administered medications on file prior to visit.    No Known Allergies   DIAGNOSTIC DATA (LABS, IMAGING, TESTING) - I reviewed patient records, labs, notes, testing and imaging myself where available.  Lab Results  Component Value Date   WBC 5.7 04/13/2024   HGB 14.2 04/13/2024   HCT 45.1 04/13/2024   MCV 87 04/13/2024   PLT 239 04/13/2024      Component Value Date/Time   NA 139 04/13/2024 1025   K 4.4 04/13/2024 1025   CL 101 04/13/2024 1025   CO2 21 04/13/2024 1025   GLUCOSE 90 04/13/2024 1025   GLUCOSE 94 06/01/2023 1347   BUN 15 04/13/2024 1025   CREATININE 0.89 04/13/2024 1025   CREATININE 0.73 06/01/2023 1347   CALCIUM 9.1 04/13/2024 1025   PROT 6.7 04/13/2024 1025   ALBUMIN 4.3 04/13/2024 1025   AST 22 04/13/2024 1025   ALT 29 04/13/2024 1025   ALKPHOS 88 04/13/2024 1025   BILITOT 0.4 04/13/2024  1025   GFRNONAA >60 06/05/2021 1542   GFRAA 119 03/19/2020 0913   Lab Results  Component Value Date   CHOL 188 04/13/2024   HDL 49 04/13/2024   LDLCALC 127 (H) 04/13/2024   TRIG 63 04/13/2024   CHOLHDL 3.7 06/01/2023   Lab Results  Component Value Date   HGBA1C 5.2 04/13/2024   Lab Results  Component Value Date   VITAMINB12 552 04/13/2024   Lab Results  Component Value Date   TSH 2.00 06/01/2023    PHYSICAL EXAM:  Vitals:   08/22/24 0817  BP: 122/78  Pulse: 88  SpO2: 99%  No data found. Body mass index is 49.32 kg/m.   Wt Readings from Last 3 Encounters:  08/22/24 (!) 353 lb 9.6 oz (160.4 kg)  08/15/24 (!) 350 lb (158.8 kg)  07/18/24 (!) 347 lb (157.4 kg)     Ht Readings from Last 3 Encounters:  08/22/24 5' 11 (1.803 m)  08/15/24 5' 10 (1.778 m)  07/18/24 5' 10 (1.778 m)      General: The patient is awake, alert and appears not in acute distress and groomed. Head: Normocephalic, atraumatic.  Neck is supple. Mallampati 2  neck circumference: 18. Nasal airflow non restricted, no nasal septal deviation ,Retrognathia is not seen. Full facial hair.   Cardiovascular:  Regular rate and rhythm , without  murmurs or carotid bruit, and without distended neck veins. Respiratory: Lungs are clear to auscultation. Skin:  Without evidence of edema, or rash   Dental status, wears a bruxism guard. , doesn't help with snoring.  No TMJ,  Trunk: BMI is  50.67, up from 46.5 in 2017,  324 lbs.  Neurologic exam : The patient is awake and alert, oriented to place and time.     Cranial nerves: Pupils are equal in size and round, and briskly reactive to light. Funduscopic exam deferred today.  Extraocular movements  in vertical and horizontal planes intact and without nystagmus. Visual fields by finger perimetry are intact. Hearing to finger rub intact. Facial sensation intact to fine touch. Facial motor strength is symmetric and tongue and uvula move midline. Shoulder  shrug was symmetrical.  Motor exam:  Symmetric bulk, tone and ROM.   Normal tone without cog wheeling, symmetric grip strength . Gait and Station: Normal.  ASSESSMENT AND PLAN :   43 y.o. year old adult  here with:    1) Moderate severe OSA on CPAP,  no longer EDS ( excessively daytime sleepiness) and no longer suffering from headaches, average sleep time approaches 7 hours. Wife is happy he no longer snores.  His residual AHI is 2.5/h and uses a 6-16 cm water pressure range.  95% pressure is 10 cm water.  No major air leak with nasal pillows !   (Uses a P 10 , I will offer a wisp if this P 10 wears to quickly. )   2) depression has improved,  Viibryd .  3) BMI has remained at 49.3 , this is his main risk factor for the kind of apnea he presents with.      I would like to thank Allwardt, Mardy HERO, Pa-c [address] [place],  [place] [address] for allowing me to meet with this pleasant patient.   Sleep Clinic Patients are generally offered input on sleep hygiene, life style changes and how to improve compliance with medical treatment where applicable. Review and reiteration of good sleep hygiene measures is offered to any sleep clinic patient, be it in the first consultation or with any follow up visits.   Any patient with sleepiness should be cautioned not to drive, work at heights, or operate dangerous or heavy equipment when feeling tired or sleepy.   The patient will be seen in follow-up in the sleep clinic at [facility] for discussion of test results, sleep related symptoms and treatment compliance review, further management strategies, etc.     The patient's condition requires frequent monitoring and adjustments in the treatment plan, reflecting the ongoing complexity of care.  This provider is the continuing focal point for all needed services for this condition.  After spending a total  time of  30  minutes face to face and time for  history taking, physical and neurologic  examination, review of laboratory studies,  personal review of imaging studies, reports and results of other testing and review of referral information / records as far as provided in visit,   Electronically signed by: Dedra Gores, MD 08/22/2024 8:41 AM  [facility] Board certified by The [employer] of Sleep Medicine and Diplomate of the [employer] of [employer]. Board certified In Neurology through the ABPN, Fellow of the [employer] of Neu[employer].

## 2024-08-22 NOTE — Progress Notes (Signed)
 SABRA

## 2024-08-22 NOTE — Patient Instructions (Signed)
 Living With Sleep Apnea Sleep apnea is a condition that affects your breathing while you're sleeping. Your tongue or the tissue in your throat may block the flow of air while you sleep. You may have shallow breathing or stop breathing for short periods of time. The breaks in breathing interrupt the deep sleep that you need to feel rested. Even if you don't wake up from the gaps in breathing, you may feel tired during the day. People with sleep apnea may snore loudly. You may have a headache in the morning and feel anxious or depressed. How can sleep apnea affect me? Sleep apnea increases your chances of being very tired during the day. This is called daytime fatigue. Sleep apnea can also increase your risk of: Heart attack. Stroke. Obesity. Type 2 diabetes. Heart failure. Irregular heartbeat. High blood pressure. If you are very tired during the day, you may be more likely to: Not do well in school or at work. Fall asleep while driving. Have trouble paying attention. Develop depression or anxiety. Have problems having sex. This is called sexual dysfunction. What actions can I take to manage sleep apnea? Sleep apnea treatment  If you were given a device to open your airway while you sleep, use it only as told by your health care provider. You may be given: An oral appliance. This is a mouthpiece that shifts your lower jaw forward. A continuous positive airway pressure (CPAP) device. This blows air through a mask. A nasal expiratory positive airway pressure (EPAP) device. This has valves that you put into each nostril. A bi-level positive airway pressure (BIPAP) device. This blows air through a mask when you breathe in and breathe out. You may need surgery if other treatments don't work for you. Sleep habits Go to sleep and wake up at the same time every day. This helps set your internal clock for sleeping. If you stay up later than usual on weekends, try to get up in the morning  within 2 hours of the time you usually wake up. Try to get at least 7-9 hours of sleep each night. Stop using a computer, tablet, and mobile phone a few hours before bedtime. Do not take long naps during the day. If you nap, limit it to 30 minutes. Have a relaxing bedtime routine. Reading or listening to music may relax you and help you sleep. Use your bedroom only for sleep. Keep your television and computer out of your bedroom. Keep your bedroom cool, dark, and quiet. Use a supportive mattress and pillows. Follow your provider's instructions for other changes to sleep habits. Nutrition Do not eat big meals in the evening. Do not have caffeine in the later part of the day. The effects of caffeine can last for more than 5 hours. Follow your provider's instructions for any changes to what you eat and drink. Lifestyle Do not drink alcohol before bedtime. Alcohol can cause you to fall asleep at first, but then it can cause you to wake up in the middle of the night and have trouble getting back to sleep. Do not smoke, vape, or use nicotine or tobacco. Medicines Take over-the-counter and prescription medicines only as told by your provider. Do not use over-the-counter sleep medicine. You may become dependent on this medicine, and it can make sleep apnea worse. Do not take medicines, such as sedatives and narcotics, unless told to by your provider. Activity Exercise on most days, but avoid exercising in the evening. Exercising near bedtime can interfere with  sleeping. If possible, spend time outside every day. Natural light helps with your internal clock. General information Lose weight if you need to. Stay at a healthy weight. If you are having surgery, make sure to tell your provider that you have sleep apnea. You may need to bring your device with you. Keep all follow-up visits. Your provider will want to check on your condition. Where to find more information National Heart, Lung, and  Blood Institute: buffalodrycleaner.gl This information is not intended to replace advice given to you by your health care provider. Make sure you discuss any questions you have with your health care provider. Document Revised: 02/03/2023 Document Reviewed: 02/03/2023 Elsevier Patient Education  2024 [employer]. 43 y.o. year old adult  here with:    1) Moderate severe OSA on CPAP,  no longer EDS ( excessively daytime sleepiness) and no longer suffering from headaches, average sleep time approaches 7 hours. Wife is happy he no longer snores.  His residual AHI is 2.5/h and uses a 6-16 cm water pressure range.  95% pressure is 10 cm water.  No major air leak with nasal pillows !   (Uses a P 10 , I will offer a wisp if this P 10 wears to quickly. )   2) depression has improved,  Viibryd .  3) BMI has remained at 49.3 , this is his main risk factor for the kind of apnea he presents with.

## 2024-08-23 ENCOUNTER — Other Ambulatory Visit (HOSPITAL_COMMUNITY): Payer: Self-pay

## 2024-08-24 ENCOUNTER — Other Ambulatory Visit (HOSPITAL_COMMUNITY): Payer: Self-pay

## 2024-08-24 MED ORDER — COVID-19 MRNA VAC-TRIS(PFIZER) 30 MCG/0.3ML IM SUSY
0.3000 mL | PREFILLED_SYRINGE | Freq: Once | INTRAMUSCULAR | 0 refills | Status: AC
  Filled 2024-08-24: qty 0.3, 1d supply, fill #0

## 2024-08-24 MED ORDER — FLUZONE 0.5 ML IM SUSY
0.5000 mL | PREFILLED_SYRINGE | Freq: Once | INTRAMUSCULAR | 0 refills | Status: AC
  Filled 2024-08-24: qty 0.5, 1d supply, fill #0

## 2024-09-07 ENCOUNTER — Ambulatory Visit: Admitting: Internal Medicine

## 2024-09-07 ENCOUNTER — Other Ambulatory Visit: Payer: Self-pay

## 2024-09-07 ENCOUNTER — Other Ambulatory Visit (HOSPITAL_COMMUNITY): Payer: Self-pay

## 2024-09-07 ENCOUNTER — Encounter: Payer: Self-pay | Admitting: Internal Medicine

## 2024-09-07 VITALS — BP 115/83 | HR 71 | Temp 98.3°F | Ht 70.0 in | Wt 350.8 lb

## 2024-09-07 DIAGNOSIS — J4530 Mild persistent asthma, uncomplicated: Secondary | ICD-10-CM | POA: Diagnosis not present

## 2024-09-07 DIAGNOSIS — R0602 Shortness of breath: Secondary | ICD-10-CM

## 2024-09-07 DIAGNOSIS — J301 Allergic rhinitis due to pollen: Secondary | ICD-10-CM

## 2024-09-07 MED ORDER — FLUTICASONE PROPIONATE HFA 110 MCG/ACT IN AERO
1.0000 | INHALATION_SPRAY | Freq: Two times a day (BID) | RESPIRATORY_TRACT | 11 refills | Status: AC
  Filled 2024-09-07: qty 12, 30d supply, fill #0

## 2024-09-07 NOTE — Patient Instructions (Signed)
 It was a pleasure to see you today!  Please schedule follow up with Tammy Parrett in 12 months. Please call sooner [phone] if issues or concerns arise. You can also send us  a message through MyChart, but but aware that this is not to be used for urgent issues and it may take up to 5-7 days to receive a reply. Please be aware that you will likely be able to view your results before I have a chance to respond to them. Please give us  5 business days to respond to any non-urgent results.    Can move to intermittent ICS given improved symptoms.  Continue prn albuterol  prior to exercise and as needed.  Continue intranasal steroids with nasacort, as well as the zyrtec with allegra in the heavy allergy months.   Flovent : Because you do not have daily symptoms, using this inhaler on an as needed basis may be sufficient.  I recommend starting to use the inhaler daily as prescribed if you have symptoms of asthma such as chest tightness, wheezing, coughing, shortness of breath. You should also start using it if you have exposure to a sick contact, worsening allergies, or any other trigger for your asthma. I recommend you keep using it even after your respiratory symptoms resolve for 3-4 days. The goal of this therapy is to prevent your symptoms from becoming a flare severe enough to require steroids like prednisone.   Please call our office if using this inhaler on an as needed basis is not sufficient. You might need to be seen sooner than our scheduled follow up.

## 2024-09-07 NOTE — Progress Notes (Signed)
 Alex [name]    [id number]    1980-11-19  Primary Care Physician:Allwardt, Mardy HERO, PA-C Date of Appointment: 09/07/2024 Established Patient Visit  Chief complaint:   Chief Complaint  Patient presents with   Shortness of Breath    Follow up     HPI: Alex [name] is a 43 y.o. male with mild persistent asthma.  Interval Updates: Here for annual asthma follow up.   No interval exacerbations. Asthma has been well controlled.   Allergies well controlled  Still working as Scientist, Forensic at [employer].   Current Regimen: flovent  1 puff BID. Uses albuterol  very minimally. Prior to exercise.  Asthma Triggers: URIs, exertion, cold weather Exacerbations in the last year: none History of hospitalization or intubation: never Allergy Testing: none GERD: denies Allergic Rhinitis: yes, controlled on nasacort, allegra, zyrtec during high seasons for allergies.  ACT:  Asthma Control Test ACT Total Score  08/27/2023  9:58 AM 20  02/18/2022  9:05 AM 18  12/15/2021 10:49 AM 13   FeNO: 15 ppb   I have reviewed the patient's family social and past medical history and updated as appropriate.   Past Medical History:  Diagnosis Date   Anxiety    Arterial embolism of left leg (HCC)    Asthma    exercise-induced   Constipation    Depression    Infertility male    Palpitation    SOB (shortness of breath)    Vitamin D  deficiency     Past Surgical History:  Procedure Laterality Date   COLONOSCOPY  2017   bleeding - negative scope   WISDOM TOOTH EXTRACTION      Family History  Problem Relation Age of Onset   Anxiety disorder Mother    Depression Mother    Obesity Mother    Sleep apnea Mother    Obesity Father    Hyperlipidemia Father    Hypertension Father    Depression Father    Anxiety disorder Father    Asthma Father    Cancer Maternal Grandfather        skin & lung   Emphysema Maternal Grandfather    Melanoma Maternal Grandfather    Cancer Paternal  Grandfather        leukemia   Obesity Maternal Aunt     Social History   Occupational History   Not on file  Tobacco Use   Smoking status: Never    Passive exposure: Past   Smokeless tobacco: Never  Vaping Use   Vaping status: Never Used  Substance and Sexual Activity   Alcohol use: Yes    Alcohol/week: 2.0 standard drinks of alcohol    Types: 2 Shots of liquor per week    Comment: occasional   Drug use: No   Sexual activity: Yes    Birth control/protection: None     Physical Exam: Blood pressure 115/83, pulse 71, temperature 98.3 F (36.8 C), temperature source Oral, height 5' 10 (1.778 m), weight (!) 350 lb 12.8 oz (159.1 kg), SpO2 98%.  Gen:      No distress ENT:  mmm Lungs:    ctab no wheezes or crackles CV:         RRR   Data Reviewed: Imaging: I have personally reviewed the ct cardiac scoring lung windows - no acute process.   PFTs:      No data to display         I have personally reviewed the  patient's PFTs and spirometry obtained today shows normal spirometry.   Labs: Lab Results  Component Value Date   WBC 5.7 04/13/2024   HGB 14.2 04/13/2024   HCT 45.1 04/13/2024   MCV 87 04/13/2024   PLT 239 04/13/2024   Lab Results  Component Value Date   NA 139 04/13/2024   K 4.4 04/13/2024   CL 101 04/13/2024   CO2 21 04/13/2024    Immunization status: Immunization History  Administered Date(s) Administered   DTaP 07/27/2017   Hep B, Unspecified 07/20/1995, 08/30/1995, 02/01/1996   Hepatitis A, Adult 06/26/2005   Influenza, Quadrivalent, Recombinant, Inj, Pf 07/23/2017   Influenza, Seasonal, Injecte, Preservative Fre 08/24/2024   Influenza,inj,Quad PF,6+ Mos 06/25/2021, 07/18/2022   Influenza-Unspecified 07/28/2019, 08/10/2023   MMR 11/22/2001   PFIZER Comirnaty(Gray Top)Covid-19 Tri-Sucrose Vaccine 11/06/2019, 11/24/2019, 10/03/2020   PNEUMOCOCCAL CONJUGATE-20 06/01/2024   Pfizer(Comirnaty)Fall Seasonal Vaccine 12 years and older  08/24/2024   Tdap 06/01/2023    External Records Personally Reviewed: PCP, vascular surgery  Assessment:  mild persistent asthma, controlled Chronic allergic rhinitis  Plan/Recommendations: Can move to intermittent ICS given improved symptoms.  Continue prn albuterol  prior to exercise and as needed.  Continue intranasal steroids with nasacort, as well as the zyrtec with allegra in the heavy allergy months.   Return to Care: Return in about 1 year (around 09/07/2025) for [employer].   Verdon Gore, MD Pulmonary and Critical Care Medicine [facility] Office:[phone]

## 2024-09-08 ENCOUNTER — Encounter (INDEPENDENT_AMBULATORY_CARE_PROVIDER_SITE_OTHER): Payer: Self-pay | Admitting: Family Medicine

## 2024-09-12 ENCOUNTER — Ambulatory Visit (INDEPENDENT_AMBULATORY_CARE_PROVIDER_SITE_OTHER): Payer: Self-pay | Admitting: Family Medicine

## 2024-09-12 ENCOUNTER — Other Ambulatory Visit: Payer: Self-pay

## 2024-09-12 ENCOUNTER — Other Ambulatory Visit (HOSPITAL_COMMUNITY): Payer: Self-pay

## 2024-09-12 ENCOUNTER — Encounter (INDEPENDENT_AMBULATORY_CARE_PROVIDER_SITE_OTHER): Payer: Self-pay | Admitting: Family Medicine

## 2024-09-12 VITALS — BP 119/71 | HR 82 | Temp 98.0°F | Ht 70.0 in | Wt 341.0 lb

## 2024-09-12 DIAGNOSIS — E669 Obesity, unspecified: Secondary | ICD-10-CM

## 2024-09-12 DIAGNOSIS — Z6841 Body Mass Index (BMI) 40.0 and over, adult: Secondary | ICD-10-CM

## 2024-09-12 DIAGNOSIS — E559 Vitamin D deficiency, unspecified: Secondary | ICD-10-CM

## 2024-09-12 DIAGNOSIS — F5089 Other specified eating disorder: Secondary | ICD-10-CM | POA: Diagnosis not present

## 2024-09-12 DIAGNOSIS — F331 Major depressive disorder, recurrent, moderate: Secondary | ICD-10-CM

## 2024-09-12 DIAGNOSIS — E7800 Pure hypercholesterolemia, unspecified: Secondary | ICD-10-CM | POA: Diagnosis not present

## 2024-09-12 DIAGNOSIS — E88819 Insulin resistance, unspecified: Secondary | ICD-10-CM

## 2024-09-12 MED ORDER — BUPROPION HCL ER (SR) 150 MG PO TB12
150.0000 mg | ORAL_TABLET | Freq: Every day | ORAL | 0 refills | Status: DC
  Filled 2024-09-12: qty 30, 30d supply, fill #0

## 2024-09-12 MED ORDER — VITAMIN D (ERGOCALCIFEROL) 1.25 MG (50000 UNIT) PO CAPS
50000.0000 [IU] | ORAL_CAPSULE | ORAL | 0 refills | Status: DC
  Filled 2024-09-12: qty 4, 28d supply, fill #0

## 2024-09-12 NOTE — Progress Notes (Signed)
 Office: [phone]  /  Fax: [phone]  WEIGHT SUMMARY AND BIOMETRICS  Anthropometric Measurements Height: 5' 10 (1.778 m) Weight: (!) 341 lb (154.7 kg) BMI (Calculated): 48.93 Weight at Last Visit: 350 lb Weight Lost Since Last Visit: 9 lb Weight Gained Since Last Visit: 0 Starting Weight: 311 lb Total Weight Loss (lbs): 0 lb (0 kg) Peak Weight: 350 lb   Body Composition  Body Fat %: 41.2 % Fat Mass (lbs): 140.8 lbs Muscle Mass (lbs): 191.2 lbs Total Body Water (lbs): 139.6 lbs Visceral Fat Rating : 27   Other Clinical Data Fasting: yes Labs: no Today's Visit #: 48 Starting Date: 06/25/21    Chief Complaint: OBESITY    History of Present Illness Alex [name] is a 43 year old with obesity and vitamin D  deficiency who presents for obesity treatment and progress assessment.  They are following a category four eating plan approximately 75% of the time, focusing on increasing protein intake and consuming more fruits and vegetables. They generally get 7-9 hours of sleep per night. They are not currently engaging in regular exercise. Over the past month, they have lost 9 pounds.  They are being treated for vitamin D  deficiency with prescription ergocalciferol , 50,000 IU per week, and request a refill. Additionally, they are managing emotional eating behaviors with bupropion  SR, 150 mg daily, and also request a refill.  They discussed their strategy for managing dietary intake during special occasions, such as Thanksgiving, by focusing on protein-centered meals and avoiding bringing leftovers home. They have taken over grocery shopping and meal preparation to better adhere to their dietary plan, allowing flexibility in meal choices while ensuring the availability of necessary ingredients.  They inquired about the potential hormonal component of their weight management challenges, prompted by a conversation with a friend, but have not previously discussed  this aspect in detail.      PHYSICAL EXAM:  Blood pressure 119/71, pulse 82, temperature 98 F (36.7 C), height 5' 10 (1.778 m), weight (!) 341 lb (154.7 kg), SpO2 98%. Body mass index is 48.93 kg/m.  DIAGNOSTIC DATA REVIEWED:  BMET    Component Value Date/Time   NA 139 04/13/2024 1025   K 4.4 04/13/2024 1025   CL 101 04/13/2024 1025   CO2 21 04/13/2024 1025   GLUCOSE 90 04/13/2024 1025   GLUCOSE 94 06/01/2023 1347   BUN 15 04/13/2024 1025   CREATININE 0.89 04/13/2024 1025   CREATININE 0.73 06/01/2023 1347   CALCIUM 9.1 04/13/2024 1025   GFRNONAA >60 06/05/2021 1542   GFRAA 119 03/19/2020 0913   Lab Results  Component Value Date   HGBA1C 5.2 04/13/2024   HGBA1C 5.0 06/25/2021   Lab Results  Component Value Date   INSULIN  18.8 04/13/2024   INSULIN  6.5 06/25/2021   Lab Results  Component Value Date   TSH 2.00 06/01/2023   CBC    Component Value Date/Time   WBC 5.7 04/13/2024 1025   WBC 7.0 06/05/2021 1542   RBC 5.19 04/13/2024 1025   RBC 4.91 06/05/2021 1542   HGB 14.2 04/13/2024 1025   HCT 45.1 04/13/2024 1025   PLT 239 04/13/2024 1025   MCV 87 04/13/2024 1025   MCH 27.4 04/13/2024 1025   MCH 27.7 06/05/2021 1542   MCHC 31.5 04/13/2024 1025   MCHC 32.1 06/05/2021 1542   RDW 13.5 04/13/2024 1025   Iron Studies    Component Value Date/Time   IRON 61 11/26/2021 0832   TIBC 373 11/26/2021 0832  FERRITIN 29 (L) 11/26/2021 0832   IRONPCTSAT 16 11/26/2021 0832   Lipid Panel     Component Value Date/Time   CHOL 188 04/13/2024 1025   TRIG 63 04/13/2024 1025   HDL 49 04/13/2024 1025   CHOLHDL 3.7 06/01/2023 1347   LDLCALC 127 (H) 04/13/2024 1025   LDLCALC 121 (H) 06/01/2023 1347   Hepatic Function Panel     Component Value Date/Time   PROT 6.7 04/13/2024 1025   ALBUMIN 4.3 04/13/2024 1025   AST 22 04/13/2024 1025   ALT 29 04/13/2024 1025   ALKPHOS 88 04/13/2024 1025   BILITOT 0.4 04/13/2024 1025      Component Value Date/Time   TSH 2.00  06/01/2023 1347   Nutritional Lab Results  Component Value Date   VD25OH 48.0 04/13/2024   VD25OH 36.9 10/12/2023   VD25OH 48 06/01/2023     Assessment and Plan Assessment & Plan Obesity with emotional eating behaviors Obesity is managed with a category four eating plan, followed 75% of the time. Emotional eating behaviors are addressed with bupropion  SR. They have lost nine pounds in the last month, indicating progress. They are working on increasing protein intake and consuming more fruits and vegetables. They are not currently exercising but are focusing on dietary changes. They have developed a strategy for managing holiday meals, focusing on protein intake and maintaining dietary flexibility. - Continue category four eating plan - Refilled bupropion  SR 150 mg daily - Encouraged continued focus on protein intake and dietary flexibility  Insulin  resistance Managed with dietary modifications. They are on a low simple carbohydrate, high lean protein diet. Previous labs showed a blip in insulin  levels, warranting further monitoring. - Ordered labs to assess insulin  resistance - Continue low simple carbohydrate, high lean protein diet  Vitamin D  deficiency Managed with prescription ergocalciferol  50,000 IU weekly. They request a refill. - Refilled ergocalciferol  50,000 IU weekly - Ordered labs to check vitamin D  levels  Pure hypercholesterolemia Managed with dietary modifications. They are on a low cholesterol diet. Additional labs are planned to assess cardiovascular risk more comprehensively. - Ordered labs to assess cholesterol levels and cardiovascular risk - Continue low cholesterol diet    Alex [name] was counseled on the importance of maintaining healthy lifestyle habits, including balanced nutrition, regular physical activity, and behavioral modifications, while taking antiobesity medication.  Patient verbalized understanding that medication is an adjunct to, not a replacement  for, lifestyle changes and that the long-term success and weight maintenance depend on continued adherence to these strategies.   Alex [name] was informed of the importance of frequent follow up visits to maximize Alex [name]'s success with intensive lifestyle modifications for Alex [name]'s obesity and obesity related health conditions as recommended by USPSTF and CMS guidelines   Louann Penton, MD

## 2024-09-13 DIAGNOSIS — F900 Attention-deficit hyperactivity disorder, predominantly inattentive type: Secondary | ICD-10-CM | POA: Diagnosis not present

## 2024-09-13 DIAGNOSIS — F411 Generalized anxiety disorder: Secondary | ICD-10-CM | POA: Diagnosis not present

## 2024-09-13 DIAGNOSIS — F331 Major depressive disorder, recurrent, moderate: Secondary | ICD-10-CM | POA: Diagnosis not present

## 2024-09-14 LAB — LIPID PANEL+APOB
Apolipoprotein B: 91 mg/dL — AB (ref <90)
Cholesterol, Total: 181 mg/dL (ref 100–199)
HDL-C: 48 mg/dL (ref >39)
LDL-C (NIH Calc): 118 mg/dL — AB (ref 0–99)
Non-HDL Cholesterol: 133 mg/dL — AB (ref 0–129)
Triglycerides: 83 mg/dL (ref 0–149)

## 2024-09-14 LAB — CMP14+EGFR
ALT: 28 IU/L (ref 0–44)
AST: 21 IU/L (ref 0–40)
Albumin: 4.1 g/dL (ref 4.1–5.1)
Alkaline Phosphatase: 93 IU/L (ref 47–123)
BUN/Creatinine Ratio: 16 (ref 9–20)
BUN: 15 mg/dL (ref 6–24)
Bilirubin Total: 0.4 mg/dL (ref 0.0–1.2)
CO2: 22 mmol/L (ref 20–29)
Calcium: 9.2 mg/dL (ref 8.7–10.2)
Chloride: 101 mmol/L (ref 96–106)
Creatinine, Ser: 0.91 mg/dL (ref 0.76–1.27)
Globulin, Total: 2.6 g/dL (ref 1.5–4.5)
Glucose: 90 mg/dL (ref 70–99)
Potassium: 4.4 mmol/L (ref 3.5–5.2)
Sodium: 136 mmol/L (ref 134–144)
Total Protein: 6.7 g/dL (ref 6.0–8.5)
eGFR: 109 mL/min/1.73 (ref >59)

## 2024-09-14 LAB — INSULIN, RANDOM: INSULIN: 15.3 u[IU]/mL (ref 2.6–24.9)

## 2024-09-14 LAB — VITAMIN D 25 HYDROXY (VIT D DEFICIENCY, FRACTURES): Vit D, 25-Hydroxy: 59.4 ng/mL (ref 30.0–100.0)

## 2024-09-14 LAB — HEMOGLOBIN A1C
Est. average glucose Bld gHb Est-mCnc: 100 mg/dL
Hgb A1c MFr Bld: 5.1 % (ref 4.8–5.6)

## 2024-09-14 LAB — LIPOPROTEIN A (LPA): Lipoprotein (a): 15.4 nmol/L (ref <75.0)

## 2024-09-28 ENCOUNTER — Other Ambulatory Visit (HOSPITAL_COMMUNITY): Payer: Self-pay

## 2024-09-28 ENCOUNTER — Other Ambulatory Visit: Payer: Self-pay

## 2024-10-04 ENCOUNTER — Encounter (INDEPENDENT_AMBULATORY_CARE_PROVIDER_SITE_OTHER): Payer: Self-pay | Admitting: Family Medicine

## 2024-10-04 ENCOUNTER — Ambulatory Visit (INDEPENDENT_AMBULATORY_CARE_PROVIDER_SITE_OTHER): Payer: Self-pay | Admitting: Family Medicine

## 2024-10-04 ENCOUNTER — Other Ambulatory Visit (HOSPITAL_COMMUNITY): Payer: Self-pay

## 2024-10-04 VITALS — BP 121/76 | HR 88 | Temp 98.2°F | Ht 70.0 in | Wt 343.0 lb

## 2024-10-04 DIAGNOSIS — E559 Vitamin D deficiency, unspecified: Secondary | ICD-10-CM | POA: Diagnosis not present

## 2024-10-04 DIAGNOSIS — F331 Major depressive disorder, recurrent, moderate: Secondary | ICD-10-CM

## 2024-10-04 DIAGNOSIS — Z6841 Body Mass Index (BMI) 40.0 and over, adult: Secondary | ICD-10-CM | POA: Diagnosis not present

## 2024-10-04 DIAGNOSIS — E785 Hyperlipidemia, unspecified: Secondary | ICD-10-CM | POA: Diagnosis not present

## 2024-10-04 DIAGNOSIS — E669 Obesity, unspecified: Secondary | ICD-10-CM | POA: Diagnosis not present

## 2024-10-04 DIAGNOSIS — E782 Mixed hyperlipidemia: Secondary | ICD-10-CM

## 2024-10-04 MED ORDER — BUPROPION HCL ER (SR) 150 MG PO TB12
150.0000 mg | ORAL_TABLET | Freq: Every day | ORAL | 0 refills | Status: DC
  Filled 2024-10-04 – 2024-10-06 (×2): qty 30, 30d supply, fill #0

## 2024-10-04 MED ORDER — VITAMIN D (ERGOCALCIFEROL) 1.25 MG (50000 UNIT) PO CAPS
50000.0000 [IU] | ORAL_CAPSULE | ORAL | 0 refills | Status: DC
  Filled 2024-10-04: qty 4, 28d supply, fill #0

## 2024-10-04 NOTE — Progress Notes (Signed)
 Office: [phone]  /  Fax: [phone]  WEIGHT SUMMARY AND BIOMETRICS  Anthropometric Measurements Height: 5' 10 (1.778 m) Weight: (!) 343 lb (155.6 kg) BMI (Calculated): 49.22 Weight at Last Visit: 341 lb Weight Lost Since Last Visit: 0 Weight Gained Since Last Visit: 2 lb Starting Weight: 311 lb Total Weight Loss (lbs): 0 lb (0 kg) Peak Weight: 350 lb   Body Composition  Body Fat %: 41.6 % Fat Mass (lbs): 142.6 lbs Muscle Mass (lbs): 190.6 lbs Total Body Water (lbs): 140 lbs Visceral Fat Rating : 28   Other Clinical Data Fasting: no Labs: no Today's Visit #: 49 Starting Date: 06/25/21    Chief Complaint: OBESITY   History of Present Illness Alex [name] is a 43 year old with obesity and emotional eating behaviors who presents for obesity treatment and progress assessment.  They have been adhering to a category four eating plan about fifty percent of the time and are not currently engaging in regular exercise. They are focusing on increasing protein intake, consuming more fruits and vegetables, and avoiding skipping meals. They gained two pounds in the last month, attributing this to events in November, including their anniversary, birthday, and Thanksgiving.  They have a diagnosis of emotional eating behaviors and are being treated with Wellbutrin  SR 150 mg daily, for which they request a refill. The medication may be aiding them in making better food choices, as they feel less inclined to consume foods that do not meet their expectations.  They are also on prescription ergocalciferol  50,000 IU weekly for vitamin D  deficiency and request a refill. Recent lab results show a vitamin D  level of 59.  They experience challenges in maintaining their eating plan during weekends and social events, tending to snack more. They employ strategies such as keeping healthier snacks at home and sharing treats with others to avoid overconsumption. They find busy  weekends easier to manage than quiet ones.  They discuss their LDL cholesterol, currently at 118, down from 121. They acknowledge that part of their cholesterol level is influenced by genetics. They mention a family history of heart disease.      PHYSICAL EXAM:  Blood pressure 121/76, pulse 88, temperature 98.2 F (36.8 C), height 5' 10 (1.778 m), weight (!) 343 lb (155.6 kg), SpO2 98%. Body mass index is 49.22 kg/m.  DIAGNOSTIC DATA REVIEWED:  BMET    Component Value Date/Time   NA 136 09/12/2024 1002   K 4.4 09/12/2024 1002   CL 101 09/12/2024 1002   CO2 22 09/12/2024 1002   GLUCOSE 90 09/12/2024 1002   GLUCOSE 94 06/01/2023 1347   BUN 15 09/12/2024 1002   CREATININE 0.91 09/12/2024 1002   CREATININE 0.73 06/01/2023 1347   CALCIUM 9.2 09/12/2024 1002   GFRNONAA >60 06/05/2021 1542   GFRAA 119 03/19/2020 0913   Lab Results  Component Value Date   HGBA1C 5.1 09/12/2024   HGBA1C 5.0 06/25/2021   Lab Results  Component Value Date   INSULIN  15.3 09/12/2024   INSULIN  6.5 06/25/2021   Lab Results  Component Value Date   TSH 2.00 06/01/2023   CBC    Component Value Date/Time   WBC 5.7 04/13/2024 1025   WBC 7.0 06/05/2021 1542   RBC 5.19 04/13/2024 1025   RBC 4.91 06/05/2021 1542   HGB 14.2 04/13/2024 1025   HCT 45.1 04/13/2024 1025   PLT 239 04/13/2024 1025   MCV 87 04/13/2024 1025   MCH 27.4 04/13/2024 1025  MCH 27.7 06/05/2021 1542   MCHC 31.5 04/13/2024 1025   MCHC 32.1 06/05/2021 1542   RDW 13.5 04/13/2024 1025   Iron Studies    Component Value Date/Time   IRON 61 11/26/2021 0832   TIBC 373 11/26/2021 0832   FERRITIN 29 (L) 11/26/2021 0832   IRONPCTSAT 16 11/26/2021 0832   Lipid Panel     Component Value Date/Time   CHOL 188 04/13/2024 1025   TRIG 63 04/13/2024 1025   HDL 49 04/13/2024 1025   CHOLHDL 3.7 06/01/2023 1347   LDLCALC 127 (H) 04/13/2024 1025   LDLCALC 121 (H) 06/01/2023 1347   Hepatic Function Panel     Component Value  Date/Time   PROT 6.7 09/12/2024 1002   ALBUMIN 4.1 09/12/2024 1002   AST 21 09/12/2024 1002   ALT 28 09/12/2024 1002   ALKPHOS 93 09/12/2024 1002   BILITOT 0.4 09/12/2024 1002      Component Value Date/Time   TSH 2.00 06/01/2023 1347   Nutritional Lab Results  Component Value Date   VD25OH 59.4 09/12/2024   VD25OH 48.0 04/13/2024   VD25OH 36.9 10/12/2023     Assessment and Plan Assessment & Plan Obesity Management is ongoing with a category four eating plan, followed approximately 50% of the time. No current exercise regimen. Emotional eating behaviors are present, managed with Wellbutrin  SR 150 mg daily. Recent weight gain of 2 pounds over Thanksgiving, attributed to social events and holidays. Strategies to manage emotional eating include mindful eating and sharing holiday treats with others. Challenges with maintaining the eating plan on weekends due to increased snacking and social activities. Emphasis on the importance of maintaining the eating plan during holidays and weekends. - Continue category four eating plan - Continue Wellbutrin  SR 150 mg daily - Encouraged strategies to manage emotional eating, such as mindful eating and sharing holiday treats - Encouraged maintaining the eating plan during holidays and weekends  Major depressive disorder, recurrent, moderate Managed with Wellbutrin  SR 150 mg daily. Reports improvement in emotional eating behaviors, possibly due to medication. Discussed the importance of managing emotional eating as part of overall mental health management. - Continue Wellbutrin  SR 150 mg daily  Vitamin D  deficiency Managed with ergocalciferol  50,000 IU weekly. Recent lab results show vitamin D  level at 59, indicating adequate control. - Continue ergocalciferol  50,000 IU weekly  Hyperlipidemia LDL at 118, improved from 878. Goal is to reduce LDL to below 100. Apolipoprotein B levels are slightly higher than desired, correlating with LDL levels.  Lipoprotein A levels are within normal range, providing reassurance. Discussed the genetic component of LDL levels and the importance of diet and lifestyle modifications. Plan to reassess lipid levels in March or April to evaluate the impact of weight loss and lifestyle changes. - Continue dietary and lifestyle modifications to manage LDL levels - Will reassess lipid levels in March or April      Patients who are on anti-obesity medications are counseled on the importance of maintaining healthy lifestyle habits, including balanced nutrition, regular physical activity, and behavioral modifications,  Medication is an adjunct to, not a replacement for, lifestyle changes and that the long-term success and weight maintenance depend on continued adherence to these strategies.   Colt was informed of the importance of frequent follow up visits to maximize [name]'s success with intensive lifestyle modifications for [name]'s obesity and obesity related health conditions as recommended by USPSTF and CMS guidelines  Louann Penton, MD

## 2024-10-06 ENCOUNTER — Other Ambulatory Visit (HOSPITAL_COMMUNITY): Payer: Self-pay

## 2024-10-06 ENCOUNTER — Other Ambulatory Visit: Payer: Self-pay

## 2024-10-12 DIAGNOSIS — F331 Major depressive disorder, recurrent, moderate: Secondary | ICD-10-CM | POA: Diagnosis not present

## 2024-10-12 DIAGNOSIS — F411 Generalized anxiety disorder: Secondary | ICD-10-CM | POA: Diagnosis not present

## 2024-10-12 DIAGNOSIS — F900 Attention-deficit hyperactivity disorder, predominantly inattentive type: Secondary | ICD-10-CM | POA: Diagnosis not present

## 2024-10-19 ENCOUNTER — Other Ambulatory Visit: Payer: Self-pay | Admitting: Physician Assistant

## 2024-10-19 DIAGNOSIS — R519 Headache, unspecified: Secondary | ICD-10-CM

## 2024-10-19 DIAGNOSIS — G4733 Obstructive sleep apnea (adult) (pediatric): Secondary | ICD-10-CM | POA: Diagnosis not present

## 2024-10-20 ENCOUNTER — Other Ambulatory Visit (HOSPITAL_COMMUNITY): Payer: Self-pay

## 2024-10-20 MED ORDER — SUMATRIPTAN SUCCINATE 50 MG PO TABS
50.0000 mg | ORAL_TABLET | Freq: Every day | ORAL | 3 refills | Status: AC | PRN
  Filled 2024-10-20: qty 18, 30d supply, fill #0

## 2024-10-28 ENCOUNTER — Other Ambulatory Visit (HOSPITAL_COMMUNITY): Payer: Self-pay

## 2024-11-08 ENCOUNTER — Other Ambulatory Visit (HOSPITAL_COMMUNITY): Payer: Self-pay

## 2024-11-08 MED ORDER — LISDEXAMFETAMINE DIMESYLATE 30 MG PO CAPS
30.0000 mg | ORAL_CAPSULE | Freq: Every morning | ORAL | 0 refills | Status: AC
  Filled 2024-11-08: qty 30, 30d supply, fill #0

## 2024-11-08 MED ORDER — VILAZODONE HCL 20 MG PO TABS
20.0000 mg | ORAL_TABLET | Freq: Every day | ORAL | 0 refills | Status: AC
  Filled 2024-11-08: qty 90, 90d supply, fill #0

## 2024-11-09 ENCOUNTER — Other Ambulatory Visit (HOSPITAL_COMMUNITY): Payer: Self-pay

## 2024-11-09 ENCOUNTER — Ambulatory Visit (INDEPENDENT_AMBULATORY_CARE_PROVIDER_SITE_OTHER): Admitting: Family Medicine

## 2024-11-09 ENCOUNTER — Encounter (INDEPENDENT_AMBULATORY_CARE_PROVIDER_SITE_OTHER): Payer: Self-pay | Admitting: Family Medicine

## 2024-11-09 ENCOUNTER — Encounter (INDEPENDENT_AMBULATORY_CARE_PROVIDER_SITE_OTHER): Payer: Self-pay

## 2024-11-09 VITALS — BP 137/84 | HR 97 | Temp 98.0°F | Ht 70.0 in | Wt 345.0 lb

## 2024-11-09 DIAGNOSIS — E559 Vitamin D deficiency, unspecified: Secondary | ICD-10-CM | POA: Diagnosis not present

## 2024-11-09 DIAGNOSIS — Z6841 Body Mass Index (BMI) 40.0 and over, adult: Secondary | ICD-10-CM | POA: Diagnosis not present

## 2024-11-09 DIAGNOSIS — F5089 Other specified eating disorder: Secondary | ICD-10-CM | POA: Diagnosis not present

## 2024-11-09 DIAGNOSIS — F331 Major depressive disorder, recurrent, moderate: Secondary | ICD-10-CM

## 2024-11-09 MED ORDER — VITAMIN D (ERGOCALCIFEROL) 1.25 MG (50000 UNIT) PO CAPS
50000.0000 [IU] | ORAL_CAPSULE | ORAL | 0 refills | Status: AC
  Filled 2024-11-09: qty 12, 84d supply, fill #0

## 2024-11-09 MED ORDER — BUPROPION HCL ER (SR) 150 MG PO TB12
150.0000 mg | ORAL_TABLET | Freq: Every day | ORAL | 0 refills | Status: AC
  Filled 2024-11-09: qty 90, 90d supply, fill #0

## 2024-11-09 NOTE — Progress Notes (Signed)
 "  Office: [phone]  /  Fax: [phone]  WEIGHT SUMMARY AND BIOMETRICS  Anthropometric Measurements Height: 5' 10 (1.778 m) Weight: (!) 345 lb (156.5 kg) BMI (Calculated): 49.5 Weight at Last Visit: 345 lb Weight Lost Since Last Visit: 0 Weight Gained Since Last Visit: 2 lb Starting Weight: 311 lb Total Weight Loss (lbs): 0 lb (0 kg) Peak Weight: 350 lb   Body Composition  Body Fat %: 40.9 % Fat Mass (lbs): 141.2 lbs Muscle Mass (lbs): 194.2 lbs Total Body Water (lbs): 139 lbs Visceral Fat Rating : 28   Other Clinical Data Fasting: no Labs: no Today's Visit #: 50 Starting Date: 06/25/21    Chief Complaint: OBESITY   History of Present Illness Alex [name] is a 44 year old with obesity and vitamin D  deficiency who presents to discuss their obesity treatment plan and assess progress.  They have been prescribed a category four eating plan but have struggled to adhere to it over the holidays, resulting in a weight gain of two pounds over the last month. They have not been exercising but are working on getting back on track.  They are being treated for vitamin D  deficiency with prescription ergocalciferol  at a dose of fifty thousand international units weekly and request a refill of this medication.  They are also being treated for emotional eating behavior with Wellbutrin  SR at a dose of 150 mg daily and request a refill.  During a recent trip to [place], they experienced significant walking, which resulted in back pain and soreness in their knee due to a prior injury. They managed the knee pain with a Theragun and did not experience issues during the trip.      PHYSICAL EXAM:  Blood pressure 137/84, pulse 97, temperature 98 F (36.7 C), height 5' 10 (1.778 m), weight (!) 345 lb (156.5 kg), SpO2 97%. Body mass index is 49.5 kg/m.  DIAGNOSTIC DATA REVIEWED BY MYSELF TODAY:  BMET    Component Value Date/Time   NA 136 09/12/2024 1002   K 4.4  09/12/2024 1002   CL 101 09/12/2024 1002   CO2 22 09/12/2024 1002   GLUCOSE 90 09/12/2024 1002   GLUCOSE 94 06/01/2023 1347   BUN 15 09/12/2024 1002   CREATININE 0.91 09/12/2024 1002   CREATININE 0.73 06/01/2023 1347   CALCIUM 9.2 09/12/2024 1002   GFRNONAA >60 06/05/2021 1542   GFRAA 119 03/19/2020 0913   Lab Results  Component Value Date   HGBA1C 5.1 09/12/2024   HGBA1C 5.0 06/25/2021   Lab Results  Component Value Date   INSULIN  15.3 09/12/2024   INSULIN  6.5 06/25/2021   Lab Results  Component Value Date   TSH 2.00 06/01/2023   CBC    Component Value Date/Time   WBC 5.7 04/13/2024 1025   WBC 7.0 06/05/2021 1542   RBC 5.19 04/13/2024 1025   RBC 4.91 06/05/2021 1542   HGB 14.2 04/13/2024 1025   HCT 45.1 04/13/2024 1025   PLT 239 04/13/2024 1025   MCV 87 04/13/2024 1025   MCH 27.4 04/13/2024 1025   MCH 27.7 06/05/2021 1542   MCHC 31.5 04/13/2024 1025   MCHC 32.1 06/05/2021 1542   RDW 13.5 04/13/2024 1025   Iron Studies    Component Value Date/Time   IRON 61 11/26/2021 0832   TIBC 373 11/26/2021 0832   FERRITIN 29 (L) 11/26/2021 0832   IRONPCTSAT 16 11/26/2021 0832   Lipid Panel     Component Value Date/Time   CHOL  188 04/13/2024 1025   TRIG 63 04/13/2024 1025   HDL 49 04/13/2024 1025   CHOLHDL 3.7 06/01/2023 1347   LDLCALC 127 (H) 04/13/2024 1025   LDLCALC 121 (H) 06/01/2023 1347   Hepatic Function Panel     Component Value Date/Time   PROT 6.7 09/12/2024 1002   ALBUMIN 4.1 09/12/2024 1002   AST 21 09/12/2024 1002   ALT 28 09/12/2024 1002   ALKPHOS 93 09/12/2024 1002   BILITOT 0.4 09/12/2024 1002      Component Value Date/Time   TSH 2.00 06/01/2023 1347   Nutritional Lab Results  Component Value Date   VD25OH 59.4 09/12/2024   VD25OH 48.0 04/13/2024   VD25OH 36.9 10/12/2023     Assessment and Plan Assessment & Plan Morbid obesity Recent weight gain of two pounds over the last month due to holiday period. Difficulty adhering to  category four eating plan and lack of exercise. Discussed meal planning and preparation strategies to improve adherence to dietary goals. Emphasized the importance of nutrition over exercise initially. - Implement meal planning and preparation strategies to meet nutritional goals. - Focus on nutrition before resuming exercise regimen.  Vitamin D  deficiency Currently managed with prescription ergocalciferol  50,000 IU weekly. - Refilled prescription for ergocalciferol  50,000 IU weekly.  Emotional eating behavior Managed with Wellbutrin  SR 150 mg daily. - Refilled prescription for Wellbutrin  SR 150 mg daily.      Patients who are on anti-obesity medications are counseled on the importance of maintaining healthy lifestyle habits, including balanced nutrition, regular physical activity, and behavioral modifications,  Medication is an adjunct to, not a replacement for, lifestyle changes and that the long-term success and weight maintenance depend on continued adherence to these strategies.   Alex [name] was informed of the importance of frequent follow up visits to maximize Alex [name]'s success with intensive lifestyle modifications for Alex [name]'s obesity and obesity related health conditions as recommended by USPSTF and CMS guidelines  Alex Penton, MD   "

## 2024-11-10 ENCOUNTER — Other Ambulatory Visit (HOSPITAL_COMMUNITY): Payer: Self-pay

## 2024-12-05 ENCOUNTER — Ambulatory Visit (INDEPENDENT_AMBULATORY_CARE_PROVIDER_SITE_OTHER): Admitting: Family Medicine

## 2025-01-03 ENCOUNTER — Ambulatory Visit (INDEPENDENT_AMBULATORY_CARE_PROVIDER_SITE_OTHER): Admitting: Family Medicine

## 2025-06-06 ENCOUNTER — Encounter: Admitting: Physician Assistant

## 2025-08-23 ENCOUNTER — Ambulatory Visit: Admitting: Neurology
